# Patient Record
Sex: Male | Born: 1960 | Race: Black or African American | Hispanic: No | Marital: Single
Health system: Southern US, Community
[De-identification: ages and names within clinical notes are randomized; demographics above are authoritative.]

## PROBLEM LIST (undated history)

## (undated) ENCOUNTER — Encounter

## (undated) DIAGNOSIS — Z9889 Other specified postprocedural states: Secondary | ICD-10-CM

## (undated) DIAGNOSIS — F329 Major depressive disorder, single episode, unspecified: Secondary | ICD-10-CM

## (undated) DIAGNOSIS — E119 Type 2 diabetes mellitus without complications: Secondary | ICD-10-CM

## (undated) DIAGNOSIS — M1990 Unspecified osteoarthritis, unspecified site: Secondary | ICD-10-CM

## (undated) DIAGNOSIS — G4733 Obstructive sleep apnea (adult) (pediatric): Secondary | ICD-10-CM

## (undated) DIAGNOSIS — F1010 Alcohol abuse, uncomplicated: Secondary | ICD-10-CM

## (undated) DIAGNOSIS — E785 Hyperlipidemia, unspecified: Secondary | ICD-10-CM

## (undated) DIAGNOSIS — R112 Nausea with vomiting, unspecified: Secondary | ICD-10-CM

## (undated) DIAGNOSIS — N189 Chronic kidney disease, unspecified: Secondary | ICD-10-CM

## (undated) DIAGNOSIS — I10 Essential (primary) hypertension: Secondary | ICD-10-CM

## (undated) DIAGNOSIS — F32A Depression, unspecified: Secondary | ICD-10-CM

## (undated) DIAGNOSIS — A159 Respiratory tuberculosis unspecified: Secondary | ICD-10-CM

## (undated) HISTORY — PX: SPINAL FUSION: SHX223

## (undated) HISTORY — PX: KNEE ARTHROSCOPY: SUR90

## (undated) HISTORY — PX: GASTRIC BYPASS: SHX52

---

## 2012-12-14 DIAGNOSIS — N529 Male erectile dysfunction, unspecified: Secondary | ICD-10-CM | POA: Diagnosis not present

## 2012-12-31 DIAGNOSIS — Z01818 Encounter for other preprocedural examination: Secondary | ICD-10-CM | POA: Diagnosis not present

## 2012-12-31 DIAGNOSIS — N529 Male erectile dysfunction, unspecified: Secondary | ICD-10-CM | POA: Diagnosis not present

## 2013-01-07 DIAGNOSIS — N529 Male erectile dysfunction, unspecified: Secondary | ICD-10-CM | POA: Diagnosis not present

## 2013-01-07 DIAGNOSIS — E119 Type 2 diabetes mellitus without complications: Secondary | ICD-10-CM | POA: Diagnosis not present

## 2013-01-07 DIAGNOSIS — I10 Essential (primary) hypertension: Secondary | ICD-10-CM | POA: Diagnosis not present

## 2013-01-08 DIAGNOSIS — E119 Type 2 diabetes mellitus without complications: Secondary | ICD-10-CM | POA: Diagnosis not present

## 2013-01-08 DIAGNOSIS — I10 Essential (primary) hypertension: Secondary | ICD-10-CM | POA: Diagnosis not present

## 2013-01-08 DIAGNOSIS — N529 Male erectile dysfunction, unspecified: Secondary | ICD-10-CM | POA: Diagnosis not present

## 2013-01-25 DIAGNOSIS — N529 Male erectile dysfunction, unspecified: Secondary | ICD-10-CM | POA: Diagnosis not present

## 2013-02-27 DIAGNOSIS — N189 Chronic kidney disease, unspecified: Secondary | ICD-10-CM

## 2013-02-27 DIAGNOSIS — N182 Chronic kidney disease, stage 2 (mild): Secondary | ICD-10-CM | POA: Diagnosis present

## 2013-02-27 HISTORY — DX: Chronic kidney disease, unspecified: N18.9

## 2013-03-12 ENCOUNTER — Inpatient Hospital Stay (HOSPITAL_COMMUNITY): Payer: Medicare Other

## 2013-03-12 ENCOUNTER — Encounter (HOSPITAL_COMMUNITY): Payer: Self-pay | Admitting: Emergency Medicine

## 2013-03-12 ENCOUNTER — Emergency Department (HOSPITAL_COMMUNITY): Payer: Medicare Other

## 2013-03-12 ENCOUNTER — Inpatient Hospital Stay (HOSPITAL_COMMUNITY)
Admission: EM | Admit: 2013-03-12 | Discharge: 2013-03-17 | DRG: 208 | Disposition: A | Discharge status: Home / Self Care | Payer: Medicare Other | Attending: Internal Medicine | Admitting: Internal Medicine

## 2013-03-12 DIAGNOSIS — E119 Type 2 diabetes mellitus without complications: Secondary | ICD-10-CM | POA: Diagnosis present

## 2013-03-12 DIAGNOSIS — J9600 Acute respiratory failure, unspecified whether with hypoxia or hypercapnia: Secondary | ICD-10-CM | POA: Diagnosis not present

## 2013-03-12 DIAGNOSIS — F1010 Alcohol abuse, uncomplicated: Secondary | ICD-10-CM

## 2013-03-12 DIAGNOSIS — F1020 Alcohol dependence, uncomplicated: Secondary | ICD-10-CM | POA: Diagnosis present

## 2013-03-12 DIAGNOSIS — E872 Acidosis, unspecified: Secondary | ICD-10-CM | POA: Diagnosis present

## 2013-03-12 DIAGNOSIS — Z9884 Bariatric surgery status: Secondary | ICD-10-CM

## 2013-03-12 DIAGNOSIS — D696 Thrombocytopenia, unspecified: Secondary | ICD-10-CM | POA: Diagnosis not present

## 2013-03-12 DIAGNOSIS — J690 Pneumonitis due to inhalation of food and vomit: Secondary | ICD-10-CM | POA: Diagnosis present

## 2013-03-12 DIAGNOSIS — G4730 Sleep apnea, unspecified: Secondary | ICD-10-CM | POA: Diagnosis present

## 2013-03-12 DIAGNOSIS — D638 Anemia in other chronic diseases classified elsewhere: Secondary | ICD-10-CM | POA: Diagnosis present

## 2013-03-12 DIAGNOSIS — E876 Hypokalemia: Secondary | ICD-10-CM | POA: Diagnosis present

## 2013-03-12 DIAGNOSIS — G9340 Encephalopathy, unspecified: Secondary | ICD-10-CM | POA: Diagnosis not present

## 2013-03-12 DIAGNOSIS — F1994 Other psychoactive substance use, unspecified with psychoactive substance-induced mood disorder: Secondary | ICD-10-CM | POA: Diagnosis present

## 2013-03-12 DIAGNOSIS — R739 Hyperglycemia, unspecified: Secondary | ICD-10-CM

## 2013-03-12 DIAGNOSIS — F329 Major depressive disorder, single episode, unspecified: Secondary | ICD-10-CM | POA: Diagnosis present

## 2013-03-12 DIAGNOSIS — F411 Generalized anxiety disorder: Secondary | ICD-10-CM | POA: Diagnosis present

## 2013-03-12 DIAGNOSIS — N179 Acute kidney failure, unspecified: Secondary | ICD-10-CM

## 2013-03-12 DIAGNOSIS — E873 Alkalosis: Secondary | ICD-10-CM | POA: Diagnosis present

## 2013-03-12 DIAGNOSIS — R5381 Other malaise: Secondary | ICD-10-CM | POA: Diagnosis present

## 2013-03-12 DIAGNOSIS — A419 Sepsis, unspecified organism: Secondary | ICD-10-CM

## 2013-03-12 DIAGNOSIS — I10 Essential (primary) hypertension: Secondary | ICD-10-CM | POA: Diagnosis present

## 2013-03-12 HISTORY — DX: Nausea with vomiting, unspecified: R11.2

## 2013-03-12 HISTORY — DX: Essential (primary) hypertension: I10

## 2013-03-12 HISTORY — DX: Respiratory tuberculosis unspecified: A15.9

## 2013-03-12 HISTORY — DX: Type 2 diabetes mellitus without complications: E11.9

## 2013-03-12 HISTORY — DX: Depression, unspecified: F32.A

## 2013-03-12 HISTORY — DX: Unspecified osteoarthritis, unspecified site: M19.90

## 2013-03-12 HISTORY — DX: Other specified postprocedural states: Z98.890

## 2013-03-12 HISTORY — DX: Major depressive disorder, single episode, unspecified: F32.9

## 2013-03-12 HISTORY — DX: Chronic kidney disease, unspecified: N18.9

## 2013-03-12 LAB — COMPREHENSIVE METABOLIC PANEL
ALT: 26 U/L (ref 0–53)
Alkaline Phosphatase: 126 U/L — ABNORMAL HIGH (ref 39–117)
CO2: 20 mEq/L (ref 19–32)
Chloride: 95 mEq/L — ABNORMAL LOW (ref 96–112)
GFR calc Af Amer: 61 mL/min — ABNORMAL LOW (ref >90)
GFR calc non Af Amer: 52 mL/min — ABNORMAL LOW (ref >90)
Glucose, Bld: 182 mg/dL — ABNORMAL HIGH (ref 70–99)
Potassium: 3 mEq/L — ABNORMAL LOW (ref 3.5–5.1)
Sodium: 141 mEq/L (ref 135–145)
Total Bilirubin: 0.5 mg/dL (ref 0.3–1.2)
Total Protein: 7.2 g/dL (ref 6.0–8.3)

## 2013-03-12 LAB — CBC WITH DIFFERENTIAL/PLATELET
Hemoglobin: 15.6 g/dL (ref 13.0–17.0)
Lymphocytes Relative: 12 % (ref 12–46)
Lymphs Abs: 0.9 10*3/uL (ref 0.7–4.0)
Monocytes Relative: 4 % (ref 3–12)
Neutrophils Relative %: 84 % — ABNORMAL HIGH (ref 43–77)
Platelets: 231 10*3/uL (ref 150–400)
RBC: 5.29 MIL/uL (ref 4.22–5.81)
WBC: 7.9 10*3/uL (ref 4.0–10.5)

## 2013-03-12 LAB — POCT I-STAT TROPONIN I: Troponin i, poc: 0.05 ng/mL (ref 0.00–0.08)

## 2013-03-12 LAB — PROTIME-INR
INR: 0.97 (ref 0.00–1.49)
Prothrombin Time: 12.8 seconds (ref 11.6–15.2)

## 2013-03-12 LAB — POCT I-STAT 3, ART BLOOD GAS (G3+)
pCO2 arterial: 43.4 mmHg (ref 35.0–45.0)
pH, Arterial: 7.297 — ABNORMAL LOW (ref 7.350–7.450)
pO2, Arterial: 137 mmHg — ABNORMAL HIGH (ref 80.0–100.0)

## 2013-03-12 LAB — URINALYSIS, ROUTINE W REFLEX MICROSCOPIC
Bilirubin Urine: NEGATIVE
Glucose, UA: NEGATIVE mg/dL
Ketones, ur: NEGATIVE mg/dL
Leukocytes, UA: NEGATIVE
Nitrite: NEGATIVE
Protein, ur: 100 mg/dL — AB
Specific Gravity, Urine: 1.014 (ref 1.005–1.030)
Urobilinogen, UA: 0.2 mg/dL (ref 0.0–1.0)
pH: 5.5 (ref 5.0–8.0)

## 2013-03-12 LAB — GLUCOSE, CAPILLARY
Glucose-Capillary: 198 mg/dL — ABNORMAL HIGH (ref 70–99)
Glucose-Capillary: 161 mg/dL — ABNORMAL HIGH (ref 70–99)

## 2013-03-12 LAB — URINE MICROSCOPIC-ADD ON

## 2013-03-12 LAB — ETHANOL: Alcohol, Ethyl (B): 386 mg/dL — ABNORMAL HIGH (ref 0–11)

## 2013-03-12 LAB — POCT I-STAT, CHEM 8
BUN: 22 mg/dL (ref 6–23)
Potassium: 3.1 mEq/L — ABNORMAL LOW (ref 3.5–5.1)
Sodium: 138 mEq/L (ref 135–145)
TCO2: 22 mmol/L (ref 0–100)

## 2013-03-12 LAB — CK: Total CK: 247 U/L — ABNORMAL HIGH (ref 7–232)

## 2013-03-12 LAB — CK TOTAL AND CKMB (NOT AT ARMC)
CK, MB: 5.8 ng/mL — ABNORMAL HIGH (ref 0.3–4.0)
Relative Index: 2.8 — ABNORMAL HIGH (ref 0.0–2.5)

## 2013-03-12 LAB — PHOSPHORUS: Phosphorus: 4.5 mg/dL (ref 2.3–4.6)

## 2013-03-12 LAB — CG4 I-STAT (LACTIC ACID): Lactic Acid, Venous: 6.23 mmol/L — ABNORMAL HIGH (ref 0.5–2.2)

## 2013-03-12 LAB — PROCALCITONIN: Procalcitonin: 0.11 ng/mL

## 2013-03-12 MED ORDER — ETOMIDATE 2 MG/ML IV SOLN
20.0000 mg | Freq: Once | INTRAVENOUS | Status: AC
  Administered 2013-03-12: 20 mg via INTRAVENOUS

## 2013-03-12 MED ORDER — HALOPERIDOL LACTATE 5 MG/ML IJ SOLN
10.0000 mg | Freq: Once | INTRAMUSCULAR | Status: AC
  Administered 2013-03-12: 10 mg via INTRAVENOUS
  Filled 2013-03-12: qty 2

## 2013-03-12 MED ORDER — SODIUM CHLORIDE 0.9 % IV BOLUS (SEPSIS)
1000.0000 mL | Freq: Once | INTRAVENOUS | Status: AC
  Administered 2013-03-12: 1000 mL via INTRAVENOUS

## 2013-03-12 MED ORDER — SODIUM CHLORIDE 0.9 % IV SOLN
3.0000 g | Freq: Three times a day (TID) | INTRAVENOUS | Status: DC
  Administered 2013-03-12 – 2013-03-15 (×8): 3 g via INTRAVENOUS
  Filled 2013-03-12 (×10): qty 3

## 2013-03-12 MED ORDER — CHLORHEXIDINE GLUCONATE 0.12 % MT SOLN
15.0000 mL | Freq: Two times a day (BID) | OROMUCOSAL | Status: DC
  Administered 2013-03-12 – 2013-03-14 (×4): 15 mL via OROMUCOSAL
  Filled 2013-03-12 (×3): qty 15

## 2013-03-12 MED ORDER — MIDAZOLAM HCL 10 MG/2ML IJ SOLN: 4.0000 mg | Freq: Once | INTRAMUSCULAR | Status: AC

## 2013-03-12 MED ORDER — MIDAZOLAM HCL 2 MG/2ML IJ SOLN
INTRAMUSCULAR | Status: AC
  Administered 2013-03-12: 4 mg
  Filled 2013-03-12: qty 4

## 2013-03-12 MED ORDER — FENTANYL CITRATE 0.05 MG/ML IJ SOLN
100.0000 ug | Freq: Once | INTRAMUSCULAR | Status: AC
  Administered 2013-03-12: 100 ug via INTRAVENOUS

## 2013-03-12 MED ORDER — SUCCINYLCHOLINE CHLORIDE 20 MG/ML IJ SOLN
INTRAMUSCULAR | Status: AC
  Filled 2013-03-12: qty 1

## 2013-03-12 MED ORDER — ONDANSETRON HCL 4 MG/2ML IJ SOLN
4.0000 mg | Freq: Once | INTRAMUSCULAR | Status: AC
  Administered 2013-03-12: 4 mg via INTRAVENOUS
  Filled 2013-03-12: qty 2

## 2013-03-12 MED ORDER — SODIUM CHLORIDE 0.9 % IV BOLUS (SEPSIS): 1000.0000 mL | Freq: Once | INTRAVENOUS | Status: DC

## 2013-03-12 MED ORDER — SODIUM CHLORIDE 0.9 % IV SOLN
25.0000 ug/h | INTRAVENOUS | Status: DC
  Filled 2013-03-12: qty 50

## 2013-03-12 MED ORDER — INSULIN ASPART 100 UNIT/ML ~~LOC~~ SOLN
2.0000 [IU] | SUBCUTANEOUS | Status: DC
  Administered 2013-03-12 (×2): 4 [IU] via SUBCUTANEOUS
  Administered 2013-03-13: 2 [IU] via SUBCUTANEOUS
  Administered 2013-03-13: 4 [IU] via SUBCUTANEOUS
  Administered 2013-03-13: 3 [IU] via SUBCUTANEOUS
  Administered 2013-03-13: 6 [IU] via SUBCUTANEOUS
  Administered 2013-03-13: 2 [IU] via SUBCUTANEOUS
  Administered 2013-03-14: 4 [IU] via SUBCUTANEOUS
  Administered 2013-03-14: 2 [IU] via SUBCUTANEOUS

## 2013-03-12 MED ORDER — ROCURONIUM BROMIDE 50 MG/5ML IV SOLN
INTRAVENOUS | Status: AC
  Filled 2013-03-12: qty 2

## 2013-03-12 MED ORDER — ETOMIDATE 2 MG/ML IV SOLN
INTRAVENOUS | Status: AC
  Filled 2013-03-12: qty 20

## 2013-03-12 MED ORDER — LORAZEPAM 2 MG/ML IJ SOLN
2.0000 mg | Freq: Four times a day (QID) | INTRAMUSCULAR | Status: DC
  Administered 2013-03-12: 2 mg via INTRAVENOUS
  Filled 2013-03-12: qty 1

## 2013-03-12 MED ORDER — PROPOFOL 10 MG/ML IV EMUL
5.0000 ug/kg/min | INTRAVENOUS | Status: DC
  Administered 2013-03-12: 12:00:00 via INTRAVENOUS

## 2013-03-12 MED ORDER — THIAMINE HCL 100 MG/ML IJ SOLN
100.0000 mg | Freq: Every day | INTRAMUSCULAR | Status: DC
  Administered 2013-03-12 – 2013-03-14 (×3): 100 mg via INTRAVENOUS
  Filled 2013-03-12 (×2): qty 1
  Filled 2013-03-12: qty 2

## 2013-03-12 MED ORDER — PIPERACILLIN-TAZOBACTAM 3.375 G IVPB
3.3750 g | Freq: Once | INTRAVENOUS | Status: AC
  Administered 2013-03-12: 3.375 g via INTRAVENOUS
  Filled 2013-03-12: qty 50

## 2013-03-12 MED ORDER — DEXMEDETOMIDINE HCL IN NACL 200 MCG/50ML IV SOLN
0.2000 ug/kg/h | INTRAVENOUS | Status: DC
  Filled 2013-03-12: qty 50

## 2013-03-12 MED ORDER — FOLIC ACID 5 MG/ML IJ SOLN
1.0000 mg | Freq: Every day | INTRAMUSCULAR | Status: DC
  Administered 2013-03-12 – 2013-03-14 (×3): 1 mg via INTRAVENOUS
  Filled 2013-03-12 (×5): qty 0.2

## 2013-03-12 MED ORDER — HALOPERIDOL LACTATE 5 MG/ML IJ SOLN
5.0000 mg | Freq: Once | INTRAMUSCULAR | Status: AC
  Administered 2013-03-12: 5 mg via INTRAVENOUS

## 2013-03-12 MED ORDER — SODIUM CHLORIDE 0.9 % IV SOLN
INTRAVENOUS | Status: DC
  Administered 2013-03-12: 13:00:00 via INTRAVENOUS

## 2013-03-12 MED ORDER — NALOXONE HCL 0.4 MG/ML IJ SOLN
0.4000 mg | Freq: Once | INTRAMUSCULAR | Status: AC
  Administered 2013-03-12: 0.4 mg via INTRAVENOUS
  Filled 2013-03-12: qty 1

## 2013-03-12 MED ORDER — ROCURONIUM BROMIDE 50 MG/5ML IV SOLN
100.0000 mg | Freq: Once | INTRAVENOUS | Status: AC
  Administered 2013-03-12: 100 mg via INTRAVENOUS

## 2013-03-12 MED ORDER — SODIUM CHLORIDE 0.9 % IV SOLN: 250.0000 mL | INTRAVENOUS | Status: DC | PRN

## 2013-03-12 MED ORDER — DEXMEDETOMIDINE HCL IN NACL 200 MCG/50ML IV SOLN: 0.4000 ug/kg/h | INTRAVENOUS | Status: DC

## 2013-03-12 MED ORDER — FENTANYL CITRATE 0.05 MG/ML IJ SOLN
50.0000 ug | INTRAMUSCULAR | Status: DC | PRN
  Filled 2013-03-12: qty 2

## 2013-03-12 MED ORDER — BIOTENE DRY MOUTH MT LIQD
15.0000 mL | Freq: Four times a day (QID) | OROMUCOSAL | Status: DC
  Administered 2013-03-13 – 2013-03-14 (×7): 15 mL via OROMUCOSAL

## 2013-03-12 MED ORDER — PROPOFOL 10 MG/ML IV EMUL
INTRAVENOUS | Status: AC
  Filled 2013-03-12: qty 100

## 2013-03-12 MED ORDER — VANCOMYCIN HCL IN DEXTROSE 1-5 GM/200ML-% IV SOLN
1000.0000 mg | Freq: Once | INTRAVENOUS | Status: DC
  Filled 2013-03-12: qty 200

## 2013-03-12 MED ORDER — DEXMEDETOMIDINE HCL IN NACL 400 MCG/100ML IV SOLN
0.4000 ug/kg/h | INTRAVENOUS | Status: DC
  Administered 2013-03-12: 0.7 ug/kg/h via INTRAVENOUS
  Administered 2013-03-12: 1.2 ug/kg/h via INTRAVENOUS
  Administered 2013-03-13: 0.8 ug/kg/h via INTRAVENOUS
  Administered 2013-03-13: 1 ug/kg/h via INTRAVENOUS
  Administered 2013-03-13: 1.2 ug/kg/h via INTRAVENOUS
  Administered 2013-03-13: 0.9 ug/kg/h via INTRAVENOUS
  Administered 2013-03-13: 1 ug/kg/h via INTRAVENOUS
  Administered 2013-03-13: 1.4 ug/kg/h via INTRAVENOUS
  Filled 2013-03-12 (×8): qty 100

## 2013-03-12 MED ORDER — POTASSIUM CHLORIDE 10 MEQ/100ML IV SOLN: 10.0000 meq | INTRAVENOUS | Status: DC

## 2013-03-12 MED ORDER — PANTOPRAZOLE SODIUM 40 MG IV SOLR
40.0000 mg | Freq: Every day | INTRAVENOUS | Status: DC
  Administered 2013-03-12 – 2013-03-13 (×2): 40 mg via INTRAVENOUS
  Filled 2013-03-12 (×4): qty 40

## 2013-03-12 MED ORDER — DEXTROSE 5 % IV SOLN
30.0000 ug/min | INTRAVENOUS | Status: DC
  Filled 2013-03-12: qty 1

## 2013-03-12 MED ORDER — HALOPERIDOL LACTATE 5 MG/ML IJ SOLN
INTRAMUSCULAR | Status: AC
  Filled 2013-03-12: qty 1

## 2013-03-12 MED ORDER — LIDOCAINE HCL (CARDIAC) 20 MG/ML IV SOLN
INTRAVENOUS | Status: AC
  Filled 2013-03-12: qty 5

## 2013-03-12 MED ORDER — DEXMEDETOMIDINE HCL IN NACL 400 MCG/100ML IV SOLN
0.4000 ug/kg/h | INTRAVENOUS | Status: DC
  Administered 2013-03-12: 1.4 ug/kg/h via INTRAVENOUS

## 2013-03-12 NOTE — ED Notes (Signed)
8.0 ET tube, at 23 lip.

## 2013-03-12 NOTE — H&P (Signed)
PULMONARY  / CRITICAL CARE MEDICINE  Name: Angel Phillips MRN: [id number] DOB: 11/15/1960    ADMISSION DATE:  03/12/2013 CONSULTATION DATE:  03/12/13  REFERRING MD :  EDP PRIMARY SERVICE: CCM  CHIEF COMPLAINT:  AMS  BRIEF PATIENT DESCRIPTION: 52 year old male, admitted with AMS intubated in ED. History of alcohol binges unknown if chronic use.   SIGNIFICANT EVENTS / STUDIES:  6/14 Admit 6/14 Head CT >>> 6/14 EEG >>>  LINES / TUBES: 8.0 ETT 6/14 >>>  CULTURES: Blood x2 6/14 >>> Urine 6/14 >>>   ANTIBIOTICS: Unasyn 6/14 >>>   HISTORY OF PRESENT ILLNESS:  Angel Phillips is a 52 year old male with a history of alcohol binges but an unclear history of chronic alcohol use. His family noted that it had been at least 4 days since they had been able to contact him and called [facility] PD to check on him on 6/14. Police broke into his home and found him minimally responsive in a chair, covered in urine, feces, and vomit. He was noted to have multiple empty alcohol containers surrounding him.  Upon arrival to the ED he was intermittently combative and minimally responsive. He was intubated for airway protection and for further workup and evaluation. They noted an increased lactate and he also has an AKI.  We are consulted for admission and management.   PAST MEDICAL HISTORY :  Past Medical History  Diagnosis Date  . Hypertension   . Diabetes mellitus without complication    History reviewed. No pertinent past surgical history. Prior to Admission medications   Not on File   No Known Allergies  FAMILY HISTORY:  History reviewed. No pertinent family history. SOCIAL HISTORY:  reports that  drinks alcohol. His tobacco and drug histories are not on file.  REVIEW OF SYSTEMS:  Unable to obtain following intubation  SUBJECTIVE:   Intubated  VITAL SIGNS: Temp:  [97.5 F (36.4 C)] 97.5 F (36.4 C) (06/14 0948) Pulse Rate:  [101-139] 139 (06/14 1115) Resp:  [16-26] 18 (06/14  1115) BP: (158-178)/(78-106) 178/106 mmHg (06/14 1102) SpO2:  [93 %-100 %] 100 % (06/14 1115) FiO2 (%):  [100 %] 100 % (06/14 1100) HEMODYNAMICS:   VENTILATOR SETTINGS: Vent Mode:  [-] PRVC FiO2 (%):  [100 %] 100 % Set Rate:  [16 bmp] 16 bmp Vt Set:  [600 mL] 600 mL PEEP:  [5 cmH20] 5 cmH20 Plateau Pressure:  [16 cmH20] 16 cmH20 INTAKE / OUTPUT: Intake/Output   None     PHYSICAL EXAMINATION: General:  Obese male, intubated Neuro:  Sedated. PERRL, does not follow commands HEENT:  intubated Cardiovascular:  RRR, tachycardic. S1 and S2. No JVD. +2 radial and DP pulses Lungs:  Scattered rhonchi anterior lung fields Abdomen:  Round, soft, non-tender. Active BS Musculoskeletal:  Passive motion of extremities Skin:  Angel, warm, intact  LABS:  Recent Labs Lab 03/12/13 1003 03/12/13 1020 03/12/13 1021 03/12/13 1214  HGB 15.6 16.3  --   --   WBC 7.9  --   --   --   PLT 231  --   --   --   NA 141 138  --   --   K 3.0* 3.1*  --   --   CL 95* 99  --   --   CO2 20  --   --   --   GLUCOSE 182* 189*  --   --   BUN 23 22  --   --   CREATININE 1.48* 2.00*  --   --  CALCIUM 8.1*  --   --   --   AST 59*  --   --   --   ALT 26  --   --   --   ALKPHOS 126*  --   --   --   BILITOT 0.5  --   --   --   PROT 7.2  --   --   --   ALBUMIN 3.1*  --   --   --   INR 0.98  --   --   --   LATICACIDVEN  --   --  6.23*  --   PHART  --   --   --  7.297*  PCO2ART  --   --   --  43.4  PO2ART  --   --   --  137.0*    Recent Labs Lab 03/12/13 1154  GLUCAP 198*    CXR:  ETT in satisfactory position. No acute cardiopulmonary process  ASSESSMENT / PLAN:  PULMONARY  Recent Labs Lab 03/12/13 1020 03/12/13 1214  PHART  --  7.297*  PCO2ART  --  43.4  PO2ART  --  137.0*  HCO3  --  21.4  TCO2 22 23  O2SAT  --  99.0    A: Acute respiratory failure most likely alcohol intoxication, could be post-ictal from seizure P:   - Full vent support  CARDIOVASCULAR  No results found for  this basename: TROPONINI,  in the last 168 hours No results found for this basename: PROBNP,  in the last 168 hours  A: Hypertension could be alcohol withdrawal driving his hypertension     Tachycardia can also be related to alcohol withdrawal, or could be hypovolemia P:  - IVF bolus for hypovolemia - MIVF while NPO - Ativan for withdrawal - Cycle cardiac enzymes with hypertension and tachycardia  RENAL  Recent Labs Lab 03/12/13 1003 03/12/13 1020 03/12/13 1110  NA 141 138  --   K 3.0* 3.1*  --   CL 95* 99  --   CO2 20  --   --   GLUCOSE 182* 189*  --   BUN 23 22  --   CREATININE 1.48* 2.00*  --   CALCIUM 8.1*  --   --   MG  --   --  2.3  PHOS  --   --  4.5     Intake/Output     06/13 0701 - 06/14 0700 06/14 0701 - 06/15 0700   I.V.  1200   Total Intake   1200   Urine  350   Total Output   350   Net   +850          A:  AKI could be prerenal etiology vs rhabdomyolysis with his prolonged down time      Metabolic Acidosis (anion gap is 26.0) elevated lactate on arrival and recent alcohol consumption are the most likely cause      Also has concomitant Metabolic Alkalosis (corrected bicarb was 34), likely due to bicarb loss fm diarrhea and possibly renal injury       Hypokalemia P:   - IVF as above - Send urine electrolytes - Would not give sodium bicarbonate at this time - Strict I&O - Urine myoglobin - Trend serum CK - Replete electrolytes as indicated - Follow bmet  GASTROINTESTINAL  Recent Labs Lab 03/12/13 1003 03/12/13 1203  AST 59*  --   ALT 26  --   ALKPHOS 126*  --  BILITOT 0.5  --   PROT 7.2  --   ALBUMIN 3.1*  --   INR 0.98 0.97    A:  Acute Transaminitis likely from ethanol consumption      Elevated Lipase likely from ethanol consumption P:   - NPO - IV thiamine and folate given alcohol history - Protonix for SUP  HEMATOLOGIC  Recent Labs Lab 03/12/13 1003 03/12/13 1020  HGB 15.6 16.3  HCT 44.1 48.0  WBC 7.9  --   PLT 231   --     Recent Labs Lab 03/12/13 1003 03/12/13 1203  INR 0.98 0.97    A:  No acute Issues P:  - Follow CBC, currently values likely represent hemoconcentration  INFECTIOUS  Recent Labs Lab 03/12/13 1021 03/12/13 1205 03/12/13 1206  LATICACIDVEN 6.23* 5.4*  --   PROCALCITON  --   --  0.11    Recent Labs Lab 03/12/13 1003  WBC 7.9    A:  ? aspiration P:   - Cover with Unasyn for possible aspiration - F/U cultures - Follow WBC  ENDOCRINE CBG (last 3)   Recent Labs  03/12/13 1154  GLUCAP 198*     A:  Hyperglycemia likely from acute illness, no history of DM   P:   - Check A1c - Follow CBG - Hyperglycemia protocol  NEUROLOGIC  A:  Acute encephalopathy likely from ethanol intoxication, concern for wernicke's  P:   - Propofol for sedation, would change to precedex due to elevated lipase - PRN fentanyl - Scheduled ativan - Thiamine and folic acid as above - Head CT to rule out acute intracranial process - EEG to evaluate for seizure activity  TODAY'S SUMMARY: Mr. Gilardi presents after being found altered and minimally responsive at home. He has been intubated for airway protection and to allow further workup. Ethanol intoxication seems the most likely given his blood ethanol level of 386 mg/dL, however he could be having seizure activity or alcohol withdrawal. Will need central line to help guide fluid resuscitation. Will continue workup as above.  Sharlet Salina Hocutt S-ACNP and Anders Simmonds NP     STAFF NOTE: I, Dr Lavinia Sharps have personally reviewed patient's available data, including medical history, events of note, physical examination and test results as part of my evaluation. I have discussed with resident/NP and other care providers such as pharmacist, RN and RRT.  In addition,  I personally evaluated patient and elicited key findings of being found down, lactic acidosis, and ethanol intoxication. Intubated in ER. Wil Rx supportive with fluids, vent  and abx. Will place cVL. Attempt to get hold of family.  Rest per NP/medical resident whose note is outlined above and that I agree with  The patient is critically ill with multiple organ systems failure and requires high complexity decision making for assessment and support, frequent evaluation and titration of therapies, application of advanced monitoring technologies and extensive interpretation of multiple databases.   Critical Care Time devoted to patient care services described in this note is  60  Minutes.  Dr. Kalman Shan, M.D., [facility].C.P Pulmonary and Critical Care Medicine Staff Physician [facility] Pager: [phone], If no answer or between  15:00h - 7:00h: call [phone]  03/12/2013 2:35 PM

## 2013-03-12 NOTE — ED Notes (Signed)
Pt arrives to ed via [facility] for altered loc. ems sts forced entry into pt apt after receiving phone call from son located in [employer].  CAox1. Nad. ems sts numerous ETOH btls at scene.  Pmsx4. Pt unable to answer assessment questions appropriately.

## 2013-03-12 NOTE — ED Notes (Signed)
CBG 198 

## 2013-03-12 NOTE — ED Notes (Signed)
Ct tech reports unable to perform exam-pt uncooperative.

## 2013-03-12 NOTE — Progress Notes (Signed)
ANTIBIOTIC CONSULT NOTE - INITIAL  Pharmacy Consult for Unasyn Indication: r/o aspiration PNA, r/o sepsis  No Known Allergies  Patient Measurements:   Estimated Ht: 6 ft Estimated Wt: 100 kg   Vital Signs: Temp: 97.5 F (36.4 C) (06/14 0948) Temp src: Rectal (06/14 0948) BP: 178/106 mmHg (06/14 1102) Pulse Rate: 139 (06/14 1115) Intake/Output from previous day:   Intake/Output from this shift:    Labs:  Recent Labs  03/12/13 1003 03/12/13 1020  WBC 7.9  --   HGB 15.6 16.3  PLT 231  --   CREATININE 1.48* 2.00*   CrCl is unknown because there is no height on file for the current visit. No results found for this basename: VANCOTROUGH, VANCOPEAK, VANCORANDOM, GENTTROUGH, GENTPEAK, GENTRANDOM, TOBRATROUGH, TOBRAPEAK, TOBRARND, AMIKACINPEAK, AMIKACINTROU, AMIKACIN,  in the last 72 hours   Microbiology: No results found for this or any previous visit (from the past 720 hour(s)).  Medical History: Past Medical History  Diagnosis Date  . Hypertension   . Diabetes mellitus without complication     Assessment: 52 y.o. M admitted to the [facility] after being found unresponsive sitting in feces/urine, and vomit. Pharmacy was consulted to start Unasyn for r/o aspiration PNA, r/o sepsis. Estimated Ht per ED nurse: 77ft, estimated wt: 100 kg. SCr 2, CrCl~40 ml/min (normalized)  Goal of Therapy:  Proper antibiotics for infection/cultures adjusted for renal/hepatic function   Plan:  1. Unasyn 3g IV every 8 hours 2. Will continue to follow renal function, culture results, LOT, and antibiotic de-escalation plans   Georgina Pillion, PharmD, BCPS Clinical Pharmacist Pager: [phone] 03/12/2013 12:44 PM

## 2013-03-12 NOTE — ED Notes (Signed)
Peter-NP ordered me to not admin vancomycin.

## 2013-03-12 NOTE — Procedures (Signed)
Central Venous Catheter Insertion Procedure Note Angel Phillips [id number] 1961/04/04  Procedure: Insertion of Central Venous Catheter Indications: Assessment of intravascular volume, Drug and/or fluid administration and Frequent blood sampling  Procedure Details Consent: Risks of procedure as well as the alternatives and risks of each were explained to the (patient/caregiver).  Consent for procedure obtained. and Unable to obtain consent because of altered level of consciousness. Time Out: Verified patient identification, verified procedure, site/side was marked, verified correct patient position, special equipment/implants available, medications/allergies/relevent history reviewed, required imaging and test results available.  Performed REAL TIME [place] USED TO ID AND CANNULATE VESSEL  Maximum sterile technique was used including antiseptics, cap, gloves, gown, hand hygiene, mask and sheet. Skin prep: Chlorhexidine; local anesthetic administered A antimicrobial bonded/coated triple lumen catheter was placed in the right internal jugular vein using the Seldinger technique.  Evaluation Blood flow good Complications: No apparent complications Patient did tolerate procedure well. Chest X-ray ordered to verify placement.  CXR: pending.  BABCOCK,PETE 03/12/2013, 2:57 PM  STAFF NOTE Personally staffed procedure.. Real time 2D ultrasound used for vein site selection, patency assessment, and needle entry. A record of image was made but could not be submitted for filing due to malfunction of printing device   Dr. Kalman Shan, M.D., [facility].C.P Pulmonary and Critical Care Medicine Staff Physician [facility] [place] [facility] and Critical Care Pager: [phone], If no answer or between  15:00h - 7:00h: call [phone]  03/12/2013 3:49 PM

## 2013-03-12 NOTE — ED Provider Notes (Signed)
History     CSN: [id number]  Arrival date & time 03/12/13  8469   First MD Initiated Contact with Patient 03/12/13 [phone]     Chief Complaint  Patient presents with  . Altered Mental Status   (Consider location/radiation/quality/duration/timing/severity/associated sxs/prior treatment) HPI Comments: Angel Phillips is a 52 year old obese African American male brought in to [facility] by EMS for altered mental status.  Per EMS personnel, they were called by son in [place] who had not heard from his father for the past 2 days.  When EMS arrived, they had to break into the home and found patient sitting in a chair covered in feces, urine, and vomit.  They report seeing bottles of alcohol and a glucometer.  Angel Phillips was awake but not responsive.  Noted to be tachycardic and hypertensive as per EMS.  Unable to obtain history from patient, respond to painful stimulus.  Initially, not responsive to questions and then became agitated and asking to leave.  Angel Phillips was in and out of consciousness with increasing agitation and noted to desat to 70% on room air with movement.  Placed on 4L [place] O2 with improved saturation.  Continued agitation and confusion, subsequently intubated for airway protection.    No number on chart to speak to any family member.   Patient is a 52 y.o. male presenting with altered mental status. The history is provided by the EMS personnel. No language interpreter was used.  Altered Mental Status Presenting symptoms: lethargy and partial responsiveness   Severity:  Severe Episode history:  Unable to specify Associated symptoms: agitation, bladder incontinence and vomiting     Past Medical History  Diagnosis Date  . Hypertension   . Diabetes mellitus without complication     History reviewed. No pertinent past surgical history.  History reviewed. No pertinent family history.  History  Substance Use Topics  . Smoking status: Not on file  . Smokeless tobacco: Not on file  .  Alcohol Use: Yes    Review of Systems  Unable to perform ROS Gastrointestinal: Positive for vomiting.  Genitourinary: Positive for bladder incontinence.  Psychiatric/Behavioral: Positive for agitation and altered mental status.    Allergies  Review of patient's allergies indicates no known allergies.  Home Medications  No current outpatient prescriptions on file.  BP 178/106  Pulse 139  Temp(Src) 97.5 F (36.4 C) (Rectal)  Resp 18  SpO2 100%  Physical Exam  Constitutional:  Obese and agitated, malodorous  HENT:  Head: Normocephalic and atraumatic.  Eyes:  Sluggish pupil response to light  Cardiovascular:  Tachycardia, +radial and femoral pulses intact, DP not appreciated  Pulmonary/Chest: Effort normal and breath sounds normal.  Abdominal: Soft. Bowel sounds are normal. He exhibits distension. There is no tenderness.  obese  Musculoskeletal: He exhibits edema.  Neurological:  Awake, not oriented to person, place, or time, withdraws to pain  Skin: Skin is warm and dry.  Psychiatric:  Agitated   ED Course  Procedures (including critical care time)  Labs Reviewed  CBC WITH DIFFERENTIAL - Abnormal; Notable for the following:    Neutrophils Relative % 84 (*)    All other components within normal limits  COMPREHENSIVE METABOLIC PANEL - Abnormal; Notable for the following:    Potassium 3.0 (*)    Chloride 95 (*)    Glucose, Bld 182 (*)    Creatinine, Ser 1.48 (*)    Calcium 8.1 (*)    Albumin 3.1 (*)    AST 59 (*)  Alkaline Phosphatase 126 (*)    GFR calc non Af Amer 52 (*)    GFR calc Af Amer 61 (*)    All other components within normal limits  LIPASE, BLOOD - Abnormal; Notable for the following:    Lipase 200 (*)    All other components within normal limits  CK - Abnormal; Notable for the following:    Total CK 247 (*)    All other components within normal limits  SALICYLATE LEVEL - Abnormal; Notable for the following:    Salicylate Lvl <2.0 (*)     All other components within normal limits  ETHANOL - Abnormal; Notable for the following:    Alcohol, Ethyl (B) 386 (*)    All other components within normal limits  GLUCOSE, CAPILLARY - Abnormal; Notable for the following:    Glucose-Capillary 198 (*)    All other components within normal limits  POCT I-STAT, CHEM 8 - Abnormal; Notable for the following:    Potassium 3.1 (*)    Creatinine, Ser 2.00 (*)    Glucose, Bld 189 (*)    Calcium, Ion 0.93 (*)    All other components within normal limits  CG4 I-STAT (LACTIC ACID) - Abnormal; Notable for the following:    Lactic Acid, Venous 6.23 (*)    All other components within normal limits  POCT I-STAT 3, BLOOD GAS (G3+) - Abnormal; Notable for the following:    pH, Arterial 7.297 (*)    pO2, Arterial 137.0 (*)    Acid-base deficit 5.0 (*)    All other components within normal limits  CULTURE, BLOOD (ROUTINE X 2)  CULTURE, BLOOD (ROUTINE X 2)  URINE CULTURE  CULTURE, RESPIRATORY (NON-EXPECTORATED)  PROTIME-INR  ACETAMINOPHEN LEVEL  URINALYSIS, ROUTINE W REFLEX MICROSCOPIC  AMMONIA  MAGNESIUM  TROPONIN I  TROPONIN I  TROPONIN I  LACTIC ACID, PLASMA  PROCALCITONIN  PROTIME-INR  BLOOD GAS, ARTERIAL  MYOGLOBIN, URINE  CK TOTAL AND CKMB  HEMOGLOBIN A1C  PHOSPHORUS  URINE RAPID DRUG SCREEN (HOSP PERFORMED)  POCT I-STAT TROPONIN I   Dg Chest Portable 1 View  03/12/2013   *RADIOLOGY REPORT*  Clinical Data: Altered mental status  PORTABLE CHEST - 1 VIEW  Comparison: None.  Findings: Cardiomediastinal silhouette is unremarkable. Endotracheal tube in place with tip 3.3 cm above the carina.  Poor inspiration.  No acute infiltrate or pulmonary edema.  IMPRESSION: Endotracheal tube in place.  Poor inspiration.  No acute infiltrate or pulmonary edema.   Original Report Authenticated By: Natasha Mead, M.D.    1. Sepsis     Date: 03/12/2013  Rate: 115bpm  Rhythm: sinus tachycardia  QRS Axis: left borderline  Intervals: normal  ST/T Wave  abnormalities: nonspecific T wave changes t wave flattening/inv leads I and aVL  Conduction Disutrbances:none  Narrative Interpretation: 115bpm, sinus tachycardia, non-specific t wave changes  Old EKG Reviewed: none available  MDM  Angel Phillips is a 52 year old male brought in by EMS for AMS.  Found at home sitting in chair, responsive to painful stimulus with feces, urine, and vomit.  Unknown etiology at this time. Increasing agitation and confusion, noted to desat to 70% on room air with improvement on 4L NC02, tachycardia and hypertensin.  Subsequently intubated, remained tachycardic.  Lactic acid 6.23, Troponin i poc 0.05x1, hypokalemia K 3.1 on chem 8 iSTAT.  CXR with ET tube in place, no acute infiltrate or edema. Discussed with PCCM, will admit for encephalopathy ?seizure, and ?sepsis with possible aspiration.  -CT  head and C spine -blood cx x2 -intubated on mechanical ventilation; propofol -NS   -Zosyn -Narcan x1 -Zofran x1 -CBC, PT/INR -CMET -Mag -U/A -UDS -ammonia, lipase, CK, acetaminophen, alcohol, and salicylate level  Case discussed and patient seen with Dr. Manus Gunning, agree with assessment and plan.          Darden Palmer, MD 03/12/13 1228

## 2013-03-12 NOTE — ED Notes (Signed)
bilat breathsounds confirmed.  CTA.

## 2013-03-12 NOTE — ED Notes (Signed)
Girlfriend Angel Phillips is RN- [phone].  Kire name of pts son in [place] [phone]

## 2013-03-12 NOTE — ED Provider Notes (Signed)
I saw and evaluated the patient, reviewed the resident's note and I agree with the findings and plan. If applicable, I agree with the resident's interpretation of the EKG.  If applicable, I was present for critical portions of any procedures performed.  Found slumped in chair, covered in feces, urine vomit. Alcohol containers surrounding him. Police broke in after family had not heard from him in several days. Obtunded on arrival, smells of alcohol, intermittently agitated and noncooperative. No evidence of trauma. RSI performed for airway protection and to allow further workup. Encephalopathy likely 2.2 alcohol intoxication. Questionable seizure? CT head negative. Lactic acidosis with high anion gap. AKI. Aggressive IVF fluids, cultures, empiric antibiotics for presumed aspiration.  INTUBATION Performed by: Glynn Octave  Required items: required blood products, implants, devices, and special equipment available Patient identity confirmed: provided demographic data and hospital-assigned identification number Time out: Immediately prior to procedure a "time out" was called to verify the correct patient, procedure, equipment, support staff and site/side marked as required.  Indications: airway protection  Intubation method: Glidescope Laryngoscopy   Preoxygenation: BVM  Sedatives: 20 mgEtomidate Paralytic: 100 mg Rocuronium Tube Size: 8.0 cuffed  Post-procedure assessment: chest rise and ETCO2 monitor Breath sounds: equal and absent over the epigastrium Tube secured with: ETT holder Chest x-ray interpreted by radiologist and me.  Chest x-ray findings: endotracheal tube in appropriate position  Patient tolerated the procedure well with no immediate complications.  CRITICAL CARE Performed by: Glynn Octave Total critical care time: 45 Critical care time was exclusive of separately billable procedures and treating other patients. Critical care was necessary to treat or prevent  imminent or life-threatening deterioration. Critical care was time spent personally by me on the following activities: development of treatment plan with patient and/or surrogate as well as nursing, discussions with consultants, evaluation of patient's response to treatment, examination of patient, obtaining history from patient or surrogate, ordering and performing treatments and interventions, ordering and review of laboratory studies, ordering and review of radiographic studies, pulse oximetry and re-evaluation of patient's condition.    Glynn Octave, MD 03/12/13 [phone]

## 2013-03-13 DIAGNOSIS — J9600 Acute respiratory failure, unspecified whether with hypoxia or hypercapnia: Secondary | ICD-10-CM | POA: Diagnosis not present

## 2013-03-13 DIAGNOSIS — G9340 Encephalopathy, unspecified: Secondary | ICD-10-CM | POA: Diagnosis not present

## 2013-03-13 DIAGNOSIS — E872 Acidosis, unspecified: Secondary | ICD-10-CM

## 2013-03-13 LAB — CBC
HCT: 36 % — ABNORMAL LOW (ref 39.0–52.0)
Hemoglobin: 12.4 g/dL — ABNORMAL LOW (ref 13.0–17.0)
RBC: 4.24 MIL/uL (ref 4.22–5.81)
WBC: 6.4 10*3/uL (ref 4.0–10.5)

## 2013-03-13 LAB — BLOOD GAS, ARTERIAL
MECHVT: 550 mL
O2 Saturation: 96.3 %
PEEP: 5 cmH2O
Patient temperature: 98.6
pH, Arterial: 7.418 (ref 7.350–7.450)

## 2013-03-13 LAB — BASIC METABOLIC PANEL
BUN: 22 mg/dL (ref 6–23)
CO2: 25 mEq/L (ref 19–32)
Chloride: 103 mEq/L (ref 96–112)
Glucose, Bld: 169 mg/dL — ABNORMAL HIGH (ref 70–99)
Potassium: 3.3 mEq/L — ABNORMAL LOW (ref 3.5–5.1)

## 2013-03-13 LAB — GLUCOSE, CAPILLARY
Glucose-Capillary: 143 mg/dL — ABNORMAL HIGH (ref 70–99)
Glucose-Capillary: 177 mg/dL — ABNORMAL HIGH (ref 70–99)

## 2013-03-13 MED ORDER — HALOPERIDOL LACTATE 5 MG/ML IJ SOLN
INTRAMUSCULAR | Status: AC
  Filled 2013-03-13: qty 2

## 2013-03-13 MED ORDER — HALOPERIDOL LACTATE 5 MG/ML IJ SOLN
10.0000 mg | Freq: Once | INTRAMUSCULAR | Status: AC
  Administered 2013-03-13: 10 mg via INTRAVENOUS
  Filled 2013-03-13: qty 1

## 2013-03-13 MED ORDER — MAGNESIUM SULFATE 50 % IJ SOLN
2.0000 g | Freq: Once | INTRAVENOUS | Status: AC
  Administered 2013-03-13: 2 g via INTRAVENOUS
  Filled 2013-03-13: qty 4

## 2013-03-13 MED ORDER — DEXTROSE-NACL 5-0.45 % IV SOLN
INTRAVENOUS | Status: DC
  Administered 2013-03-13: 10:00:00 via INTRAVENOUS

## 2013-03-13 MED ORDER — HALOPERIDOL LACTATE 5 MG/ML IJ SOLN
INTRAMUSCULAR | Status: AC
  Filled 2013-03-13: qty 1

## 2013-03-13 MED ORDER — PROPOFOL 10 MG/ML IV EMUL
5.0000 ug/kg/min | INTRAVENOUS | Status: DC
  Administered 2013-03-13: 20 ug/kg/min via INTRAVENOUS
  Administered 2013-03-13: 50 ug/kg/min via INTRAVENOUS
  Administered 2013-03-14: 55 ug/kg/min via INTRAVENOUS
  Administered 2013-03-14: 60 ug/kg/min via INTRAVENOUS
  Administered 2013-03-14: 30 ug/kg/min via INTRAVENOUS
  Administered 2013-03-14: 50 ug/kg/min via INTRAVENOUS
  Filled 2013-03-13 (×6): qty 100

## 2013-03-13 MED ORDER — POTASSIUM CHLORIDE 10 MEQ/50ML IV SOLN
10.0000 meq | INTRAVENOUS | Status: AC
  Administered 2013-03-13 (×4): 10 meq via INTRAVENOUS
  Filled 2013-03-13 (×4): qty 50

## 2013-03-13 NOTE — Progress Notes (Signed)
eLink Physician-Brief Progress Note Patient Name: Angel Phillips DOB: [birth year] MRN: [id number]  Date of Service  03/13/2013   HPI/Events of Note   Hypokalemia  eICU Interventions  Potassium replaced   Intervention Category Minor Interventions: Electrolytes abnormality - evaluation and management  DETERDING,ELIZABETH 03/13/2013, 6:48 AM

## 2013-03-13 NOTE — H&P (Signed)
PULMONARY  / CRITICAL CARE MEDICINE  Name: Angel Phillips MRN: [id number] DOB: 11/24/1960    ADMISSION DATE:  03/12/2013 CONSULTATION DATE:  03/12/13  REFERRING MD :  EDP PRIMARY SERVICE: CCM  CHIEF COMPLAINT:  AMS  BRIEF PATIENT DESCRIPTION: 52 year old male, admitted with AMS intubated in ED. History of alcohol binges unknown if chronic use.   SIGNIFICANT EVENTS / STUDIES:  6/14 Admit 6/14 Head CT >>> 6/14 EEG >>>  LINES / TUBES: 8.0 ETT 6/14 >>>  CULTURES: Blood x2 6/14 >>> Urine 6/14 >>>   ANTIBIOTICS: Unasyn 6/14 >>>   HISTORY OF PRESENT ILLNESS:  Angel Phillips is a 52 year old male with a history of alcohol binges but an unclear history of chronic alcohol use. His family noted that it had been at least 4 days since they had been able to contact him and called [facility] PD to check on him on 6/14. Police broke into his home and found him minimally responsive in a chair, covered in urine, feces, and vomit. He was noted to have multiple empty alcohol containers surrounding him.  Upon arrival to the ED he was intermittently combative and minimally responsive. He was intubated for airway protection and for further workup and evaluation. They noted an increased lactate and he also has an AKI.  We are consulted for admission and management. 03/12/13    SUBJECTIVE:   03/13/2013: Calm on Precedex when unstimulated but if stimulated gets very agitated. Weaning on pressure support trial right now  VITAL SIGNS: Temp:  [96.8 F (36 C)-98.3 F (36.8 C)] 97.9 F (36.6 C) (06/15 0700) Pulse Rate:  [65-139] 71 (06/15 0757) Resp:  [16-36] 23 (06/15 0757) BP: (70-178)/(45-106) 154/84 mmHg (06/15 0757) SpO2:  [93 %-100 %] 95 % (06/15 0757) FiO2 (%):  [30 %-100 %] 30 % (06/15 0757) Weight:  [113 kg (249 lb 1.9 oz)-114.9 kg (253 lb 4.9 oz)] 114.9 kg (253 lb 4.9 oz) (06/15 0500) HEMODYNAMICS: CVP:  [0 mmHg-13 mmHg] 13 mmHg VENTILATOR SETTINGS: Vent Mode:  [-] CPAP;PSV FiO2 (%):   [30 %-100 %] 30 % Set Rate:  [16 bmp] 16 bmp Vt Set:  [550 mL-600 mL] 550 mL PEEP:  [5 cmH20] 5 cmH20 Pressure Support:  [5 cmH20] 5 cmH20 Plateau Pressure:  [13 cmH20-20 cmH20] 14 cmH20 INTAKE / OUTPUT: Intake/Output     06/14 0701 - 06/15 0700 06/15 0701 - 06/16 0700   I.V. (mL/kg) 4003.6 (34.8)    NG/GT 30    IV Piggyback 200    Total Intake(mL/kg) 4233.6 (36.8)    Urine (mL/kg/hr) 2415    Total Output 2415     Net +1818.6            PHYSICAL EXAMINATION: General:  Obese male, intubated Neuro:  Sedated. PERRL, does not follow commands but this is on Precedex infusion HEENT:  intubated Cardiovascular:  RRR,. S1 and S2. No JVD. +2 radial and DP pulses Lungs:  Scattered rhonchi anterior lung fields Abdomen:  Round, soft, non-tender. Active BS Musculoskeletal:  Passive motion of extremities Skin:  Pink, warm, intact  LABS: See individual assessment and plan -    Imaging x48 hours Ct Head Wo Contrast  03/12/2013   *RADIOLOGY REPORT*  Clinical Data:  Altered level of consciousness.  Diabetes and hypertension.  CT HEAD WITHOUT CONTRAST CT CERVICAL SPINE WITHOUT CONTRAST  Technique:  Multidetector CT imaging of the head and cervical spine was performed following the standard protocol without intravenous contrast.  Multiplanar CT image reconstructions  of the cervical spine were also generated.  Comparison:  None  CT HEAD  Findings: There is no evidence of intracranial hemorrhage, brain edema or other signs of acute infarction.  There is no evidence of intracranial mass lesion or mass effect.  No abnormal extra-axial fluid collections are identified.  Ventricles normal in size.  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  No other intracranial abnormality identified.  No evidence of skull fracture or other bone lesions.  IMPRESSION: No acute intracranial findings.  CT CERVICAL SPINE  Findings: No evidence of acute cervical spine fracture or  subluxation.  Moderate degenerative disc disease is seen at C5-6 and C6-7.  Atlantoaxial degenerative changes also noted.  No other significant bone abnormality identified.  IMPRESSION:  1.  No evidence of acute cervical spine fracture or subluxation. 2.  Degenerative cervical spondylosis, as described above.   Original Report Authenticated By: Myles Rosenthal, M.D.   Ct Cervical Spine Wo Contrast  03/12/2013   *RADIOLOGY REPORT*  Clinical Data:  Altered level of consciousness.  Diabetes and hypertension.  CT HEAD WITHOUT CONTRAST CT CERVICAL SPINE WITHOUT CONTRAST  Technique:  Multidetector CT imaging of the head and cervical spine was performed following the standard protocol without intravenous contrast.  Multiplanar CT image reconstructions of the cervical spine were also generated.  Comparison:  None  CT HEAD  Findings: There is no evidence of intracranial hemorrhage, brain edema or other signs of acute infarction.  There is no evidence of intracranial mass lesion or mass effect.  No abnormal extra-axial fluid collections are identified.  Ventricles normal in size.  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  No other intracranial abnormality identified.  No evidence of skull fracture or other bone lesions.  IMPRESSION: No acute intracranial findings.  CT CERVICAL SPINE  Findings: No evidence of acute cervical spine fracture or subluxation.  Moderate degenerative disc disease is seen at C5-6 and C6-7.  Atlantoaxial degenerative changes also noted.  No other significant bone abnormality identified.  IMPRESSION:  1.  No evidence of acute cervical spine fracture or subluxation. 2.  Degenerative cervical spondylosis, as described above.   Original Report Authenticated By: Myles Rosenthal, M.D.   Dg Chest Port 1 View  03/12/2013   *RADIOLOGY REPORT*  Clinical Data: Central line placement.  PORTABLE CHEST - 1 VIEW  Comparison: Portable chest 03/12/2013.  Findings: New right IJ  approach central venous catheter is in place.  The tip of the catheter projects over the superior cavoatrial junction.  No pneumothorax identified.  Mild atelectasis is seen in the left lung base, improved.  Right lung is clear. Heart size is normal.  ET tube again noted.  IMPRESSION:  1.  Right IJ catheter tip projects over the superior cavoatrial junction.  Negative for pneumothorax. 2.  Improved subsegmental atelectasis left lung base,   Original Report Authenticated By: Holley Dexter, M.D.   Dg Chest Portable 1 View  03/12/2013   *RADIOLOGY REPORT*  Clinical Data: Altered mental status  PORTABLE CHEST - 1 VIEW  Comparison: None.  Findings: Cardiomediastinal silhouette is unremarkable. Endotracheal tube in place with tip 3.3 cm above the carina.  Poor inspiration.  No acute infiltrate or pulmonary edema.  IMPRESSION: Endotracheal tube in place.  Poor inspiration.  No acute infiltrate or pulmonary edema.   Original Report Authenticated By: Natasha Mead, M.D.    ASSESSMENT / PLAN:  PULMONARY  Recent Labs Lab 03/12/13 1020  03/12/13 1214 03/13/13 0445  PHART  --  7.297* 7.418  PCO2ART  --  43.4 36.0  PO2ART  --  137.0* 83.5  HCO3  --  21.4 22.8  TCO2 22 23 23.9  O2SAT  --  99.0 96.3    A: Acute respiratory failure  alcohol intoxication, could be post-ictal from seizure - 03/13/2013: He is on pressure support trial but not extubatable due to extreme agitation -  P:   - Full vent support  CARDIOVASCULAR    Recent Labs Lab 03/12/13 1743 03/13/13 0010  TROPONINI 0.34* <0.30   No results found for this basename: PROBNP,  in the last 168 hours  A: Hypertension could be alcohol withdrawal driving his hypertension      - 03/13/2013: Maintaining hemodynamics  P:  - MIVF with D5 half-normal saline while NPO - Ativan for withdrawal - Cycle cardiac enzymes with hypertension and tachycardia  RENAL  Recent Labs Lab 03/12/13 1003 03/12/13 1020 03/12/13 1110 03/13/13 0542  NA  141 138  --  141  K 3.0* 3.1*  --  3.3*  CL 95* 99  --  103  CO2 20  --   --  25  GLUCOSE 182* 189*  --  169*  BUN 23 22  --  22  CREATININE 1.48* 2.00*  --  1.28  CALCIUM 8.1*  --   --  7.6*  MG  --   --  2.3 1.9  PHOS  --   --  4.5 2.6    Recent Labs Lab 03/12/13 1021 03/12/13 1205 03/12/13 1206  LATICACIDVEN 6.23* 5.4*  --   PROCALCITON  --   --  0.11      Intake/Output     06/14 0701 - 06/15 0700 06/15 0701 - 06/16 0700   I.V. (mL/kg) 4003.6 (34.8)    NG/GT 30    IV Piggyback 200    Total Intake(mL/kg) 4233.6 (36.8)    Urine (mL/kg/hr) 2415    Total Output 2415     Net +1818.6            A:  Lactic acidosis at admission 03/12/2013. CK only 209 and no evidence of rhabdomyolysis  - 03/13/2013: Acidosis appears improving. He is hypokalemic and hypomagnesemic  P:   - Replace potassium and magnesium - Replete electrolytes as indicated - Follow bmet  GASTROINTESTINAL  Recent Labs Lab 03/12/13 1003 03/12/13 1203  AST 59*  --   ALT 26  --   ALKPHOS 126*  --   BILITOT 0.5  --   PROT 7.2  --   ALBUMIN 3.1*  --   INR 0.98 0.97    A:  Acute Transaminitis likely from ethanol consumption      Elevated Lipase likely from ethanol consumption P:   - NPO to continue - IV thiamine and folate given alcohol history - Protonix for SUP  HEMATOLOGIC  Recent Labs Lab 03/12/13 1003 03/12/13 1020 03/13/13 0542  HGB 15.6 16.3 12.4*  HCT 44.1 48.0 36.0*  WBC 7.9  --  6.4  PLT 231  --  140*    Recent Labs Lab 03/12/13 1003 03/12/13 1203  INR 0.98 0.97    A:  Anemia of critical illness P:  - Follow CBC, currently values likely represent hemoconcentration - - PRBC for hgb </= 6.9gm%    - exceptions are   -  if ACS susepcted/confirmed then transfuse for hgb </= 8.0gm%,  or    -  If septic shock first  24h and scvo2 < 70% then transfuse for hgb </= 9.0gm%   - active bleeding with hemodynamic instability, then transfuse regardless of hemoglobin value   At  at all times try to transfuse 1 unit prbc as possible with exception of active hemorrhage    INFECTIOUS  Recent Labs Lab 03/12/13 1021 03/12/13 1205 03/12/13 1206  LATICACIDVEN 6.23* 5.4*  --   PROCALCITON  --   --  0.11    Recent Labs Lab 03/12/13 1003 03/13/13 0542  WBC 7.9 6.4    A:  ? aspiration P:   - Cover with Unasyn for possible aspiration - F/U cultures - Follow WBC  ENDOCRINE CBG (last 3)   Recent Labs  03/12/13 2359 03/13/13 0405 03/13/13 0714  GLUCAP 100* 143* 177*     A:  Hyperglycemia likely from acute illness, no history of DM   P:   - Check A1c - Follow CBG - Hyperglycemia protocol  NEUROLOGIC  A:  Acute encephalopathy likely from ethanol intoxication, concern for wernicke's  P:   -Precededex for sedation,  (avoid dipirivan due to elevated lipase) - PRN fentanyl and haldol and if needed ativan - Thiamine and folic acid as above - - EEG to evaluate for seizure activity    GLOBAL 03/13/13:  No family at bedside. Son in [place]. Girl firend apparently visited     The patient is critically ill with multiple organ systems failure and requires high complexity decision making for assessment and support, frequent evaluation and titration of therapies, application of advanced monitoring technologies and extensive interpretation of multiple databases.   Critical Care Time devoted to patient care services described in this note is  35  Minutes.  Dr. Kalman Shan, M.D., [facility].C.P Pulmonary and Critical Care Medicine Staff Physician [facility] Pager: [phone], If no answer or between  15:00h - 7:00h: call [phone]  03/13/2013 8:36 AM

## 2013-03-14 ENCOUNTER — Inpatient Hospital Stay (HOSPITAL_COMMUNITY): Payer: Medicare Other

## 2013-03-14 ENCOUNTER — Other Ambulatory Visit (HOSPITAL_COMMUNITY): Payer: Self-pay

## 2013-03-14 DIAGNOSIS — G9340 Encephalopathy, unspecified: Secondary | ICD-10-CM | POA: Diagnosis not present

## 2013-03-14 DIAGNOSIS — J9600 Acute respiratory failure, unspecified whether with hypoxia or hypercapnia: Secondary | ICD-10-CM | POA: Diagnosis not present

## 2013-03-14 LAB — BASIC METABOLIC PANEL
CO2: 26 mEq/L (ref 19–32)
Calcium: 8.3 mg/dL — ABNORMAL LOW (ref 8.4–10.5)
Calcium: 8.2 mg/dL — ABNORMAL LOW (ref 8.4–10.5)
Chloride: 97 mEq/L (ref 96–112)
GFR calc non Af Amer: 67 mL/min — ABNORMAL LOW (ref >90)
Glucose, Bld: 282 mg/dL — ABNORMAL HIGH (ref 70–99)
Potassium: 2.8 mEq/L — ABNORMAL LOW (ref 3.5–5.1)
Sodium: 141 mEq/L (ref 135–145)
Sodium: 138 mEq/L (ref 135–145)

## 2013-03-14 LAB — GLUCOSE, CAPILLARY
Glucose-Capillary: 125 mg/dL — ABNORMAL HIGH (ref 70–99)
Glucose-Capillary: 142 mg/dL — ABNORMAL HIGH (ref 70–99)
Glucose-Capillary: 222 mg/dL — ABNORMAL HIGH (ref 70–99)
Glucose-Capillary: 222 mg/dL — ABNORMAL HIGH (ref 70–99)

## 2013-03-14 LAB — HEPATIC FUNCTION PANEL
Alkaline Phosphatase: 119 U/L — ABNORMAL HIGH (ref 39–117)
Indirect Bilirubin: 0.6 mg/dL (ref 0.3–0.9)
Total Bilirubin: 1.1 mg/dL (ref 0.3–1.2)

## 2013-03-14 LAB — URINE CULTURE
Culture: NO GROWTH
Special Requests: NORMAL

## 2013-03-14 LAB — CBC
MCH: 29.6 pg (ref 26.0–34.0)
MCHC: 34.9 g/dL (ref 30.0–36.0)
Platelets: 118 10*3/uL — ABNORMAL LOW (ref 150–400)

## 2013-03-14 LAB — PHOSPHORUS
Phosphorus: 1 mg/dL — CL (ref 2.3–4.6)
Phosphorus: 1 mg/dL — CL (ref 2.3–4.6)

## 2013-03-14 LAB — MAGNESIUM
Magnesium: 1.8 mg/dL (ref 1.5–2.5)
Magnesium: 1.5 mg/dL (ref 1.5–2.5)

## 2013-03-14 MED ORDER — MAGNESIUM SULFATE 50 % IJ SOLN
6.0000 g | Freq: Once | INTRAVENOUS | Status: AC
  Administered 2013-03-14: 6 g via INTRAVENOUS
  Filled 2013-03-14: qty 12

## 2013-03-14 MED ORDER — POTASSIUM PHOSPHATE DIBASIC 3 MMOLE/ML IV SOLN: 10.0000 mmol | Freq: Once | INTRAVENOUS | Status: DC

## 2013-03-14 MED ORDER — LORAZEPAM 2 MG/ML IJ SOLN: 1.0000 mg | Freq: Four times a day (QID) | INTRAMUSCULAR | Status: DC | PRN

## 2013-03-14 MED ORDER — LORAZEPAM 1 MG PO TABS: 0.0000 mg | ORAL_TABLET | Freq: Four times a day (QID) | ORAL | Status: DC

## 2013-03-14 MED ORDER — LORAZEPAM 1 MG PO TABS: 1.0000 mg | ORAL_TABLET | Freq: Four times a day (QID) | ORAL | Status: DC | PRN

## 2013-03-14 MED ORDER — SODIUM CHLORIDE 0.9 % IV SOLN
25.0000 ug/h | INTRAVENOUS | Status: DC
  Administered 2013-03-14 (×2): 50 ug/h via INTRAVENOUS
  Filled 2013-03-14: qty 50

## 2013-03-14 MED ORDER — POTASSIUM CHLORIDE 10 MEQ/50ML IV SOLN
10.0000 meq | INTRAVENOUS | Status: AC
  Administered 2013-03-14 (×4): 10 meq via INTRAVENOUS
  Filled 2013-03-14: qty 200

## 2013-03-14 MED ORDER — POTASSIUM PHOSPHATE DIBASIC 3 MMOLE/ML IV SOLN
30.0000 mmol | Freq: Once | INTRAVENOUS | Status: AC
  Administered 2013-03-14: 30 mmol via INTRAVENOUS
  Filled 2013-03-14: qty 10

## 2013-03-14 MED ORDER — SODIUM PHOSPHATE 3 MMOLE/ML IV SOLN: 20.0000 mmol | Freq: Once | INTRAVENOUS | Status: DC

## 2013-03-14 MED ORDER — PANTOPRAZOLE SODIUM 40 MG PO TBEC
40.0000 mg | DELAYED_RELEASE_TABLET | Freq: Every day | ORAL | Status: DC
  Administered 2013-03-14 – 2013-03-17 (×4): 40 mg via ORAL
  Filled 2013-03-14 (×3): qty 1

## 2013-03-14 MED ORDER — ZOLPIDEM TARTRATE 5 MG PO TABS: 5.0000 mg | ORAL_TABLET | Freq: Every evening | ORAL | Status: DC | PRN

## 2013-03-14 MED ORDER — MENTHOL 3 MG MT LOZG
1.0000 | LOZENGE | OROMUCOSAL | Status: DC | PRN
  Administered 2013-03-14 – 2013-03-17 (×10): 3 mg via ORAL
  Filled 2013-03-14 (×3): qty 9

## 2013-03-14 MED ORDER — LORAZEPAM 1 MG PO TABS: 0.0000 mg | ORAL_TABLET | Freq: Two times a day (BID) | ORAL | Status: DC

## 2013-03-14 MED ORDER — VITAMIN B-1 100 MG PO TABS
100.0000 mg | ORAL_TABLET | Freq: Every day | ORAL | Status: DC
  Administered 2013-03-15 – 2013-03-17 (×3): 100 mg via ORAL
  Filled 2013-03-14 (×4): qty 1

## 2013-03-14 MED ORDER — THIAMINE HCL 100 MG/ML IJ SOLN
100.0000 mg | Freq: Every day | INTRAMUSCULAR | Status: DC
  Filled 2013-03-14: qty 1

## 2013-03-14 MED ORDER — LORAZEPAM 1 MG PO TABS
1.0000 mg | ORAL_TABLET | Freq: Four times a day (QID) | ORAL | Status: DC | PRN
  Administered 2013-03-14: 1 mg via ORAL
  Filled 2013-03-14: qty 1

## 2013-03-14 MED ORDER — LORAZEPAM 2 MG/ML IJ SOLN
1.0000 mg | Freq: Four times a day (QID) | INTRAMUSCULAR | Status: DC | PRN
  Administered 2013-03-14 – 2013-03-15 (×3): 4 mg via INTRAVENOUS
  Filled 2013-03-14 (×3): qty 2

## 2013-03-14 MED ORDER — FOLIC ACID 1 MG PO TABS
1.0000 mg | ORAL_TABLET | Freq: Every day | ORAL | Status: DC
  Administered 2013-03-15 – 2013-03-17 (×3): 1 mg via ORAL
  Filled 2013-03-14 (×4): qty 1

## 2013-03-14 MED ORDER — ADULT MULTIVITAMIN W/MINERALS CH
1.0000 | ORAL_TABLET | Freq: Every day | ORAL | Status: DC
  Administered 2013-03-15 – 2013-03-17 (×3): 1 via ORAL
  Filled 2013-03-14 (×4): qty 1

## 2013-03-14 MED ORDER — ZOLPIDEM TARTRATE 5 MG PO TABS
5.0000 mg | ORAL_TABLET | Freq: Every evening | ORAL | Status: DC | PRN
  Administered 2013-03-14 – 2013-03-16 (×3): 5 mg via ORAL
  Filled 2013-03-14 (×3): qty 1

## 2013-03-14 MED ORDER — FENTANYL BOLUS VIA INFUSION
25.0000 ug | Freq: Four times a day (QID) | INTRAVENOUS | Status: DC | PRN
  Filled 2013-03-14: qty 100

## 2013-03-14 NOTE — H&P (Signed)
PULMONARY  / CRITICAL CARE MEDICINE  Name: Angel Phillips MRN: [id number] DOB: 08/01/1961    ADMISSION DATE:  03/12/2013 CONSULTATION DATE:  03/12/13  REFERRING MD :  EDP PRIMARY SERVICE: CCM  CHIEF COMPLAINT:  AMS  BRIEF PATIENT DESCRIPTION: 52 year old male, binge drinker, admitted with AMS & intubated in ED due to altering agitation / somnolence.   SIGNIFICANT EVENTS:  6/14 - Admit with AMS, intubated   STUDIES: 6/14 Head CT / Neck>>>no cervical abnormality, no acute intracranial findings 6/14 EEG >>>  LINES / TUBES: ETT 6/14 >>>6/16 R IJ TLC 6/14>>>  CULTURES: Blood x2 6/14 >>> Urine 6/14 >>> negative  ANTIBIOTICS: Unasyn 6/14 >>>   SUBJECTIVE: RT reports pt on 5/5 wean since 0730, tolerating well  VITAL SIGNS: Temp:  [99 F (37.2 C)-100.6 F (38.1 C)] 99 F (37.2 C) (06/16 0800) Pulse Rate:  [61-99] 99 (06/16 1213) Resp:  [14-26] 25 (06/16 1213) BP: (88-170)/(49-93) 141/69 mmHg (06/16 1213) SpO2:  [97 %-100 %] 100 % (06/16 1213) FiO2 (%):  [30 %] 30 % (06/16 1213) Weight:  [253 lb 8.5 oz (115 kg)] 253 lb 8.5 oz (115 kg) (06/16 0500)  HEMODYNAMICS: CVP:  [6 mmHg-18 mmHg] 12 mmHg  VENTILATOR SETTINGS: Vent Mode:  [-] CPAP;PSV FiO2 (%):  [30 %] 30 % Set Rate:  [16 bmp] 16 bmp Vt Set:  [550 mL] 550 mL PEEP:  [5 cmH20] 5 cmH20 Pressure Support:  [5 cmH20] 5 cmH20 Plateau Pressure:  [12 cmH20-18 cmH20] 12 cmH20  INTAKE / OUTPUT: Intake/Output     06/15 0701 - 06/16 0700 06/16 0701 - 06/17 0700   I.V. (mL/kg) 2159.6 (18.8) 347.1 (3)   NG/GT 20 30   IV Piggyback 385 85   Total Intake(mL/kg) 2564.6 (22.3) 462.1 (4)   Urine (mL/kg/hr) 2460 (0.9) 255 (0.4)   Emesis/NG output 620 (0.2) 400 (0.7)   Total Output 3080 655   Net -515.4 -192.9          PHYSICAL EXAMINATION: General:  Obese male, NAD Neuro:  Awake, follows commands HEENT:  Mm pink/moist Cardiovascular: s1s2 rrr, no m/r/g Lungs:resp's even/non-labored on PSV, pulling volumes of  3L Abdomen:  Round, soft, non-tender. Active BS Musculoskeletal:  No acute deformities Skin:  Pink, warm, intact  LABS:   Recent Labs Lab 03/12/13 1003 03/12/13 1020 03/13/13 0542 03/14/13 0405  HGB 15.6 16.3 12.4* 12.6*  HCT 44.1 48.0 36.0* 36.1*  WBC 7.9  --  6.4 7.1  PLT 231  --  140* 118*    Recent Labs Lab 03/12/13 1003 03/12/13 1020 03/12/13 1110 03/13/13 0542 03/14/13 0405  NA 141 138  --  141 141  K 3.0* 3.1*  --  3.3* 2.9*  CL 95* 99  --  103 101  CO2 20  --   --  25 29  GLUCOSE 182* 189*  --  169* 143*  BUN 23 22  --  22 18  CREATININE 1.48* 2.00*  --  1.28 1.20  CALCIUM 8.1*  --   --  7.6* 8.3*  MG  --   --  2.3 1.9 1.8  PHOS  --   --  4.5 2.6 1.0*   Imaging: Dg Chest Port 1 View  03/14/2013   *RADIOLOGY REPORT*  Clinical Data: Check endotracheal tube  PORTABLE CHEST - 1 VIEW  Comparison: 03/12/13  Findings: Endotracheal tube, endotracheal tube and right central venous line are unchanged.  Interval placement of an NG tube with tip in the stomach.  Normal cardiac silhouette. Local lung volumes. No pulmonary edema.  No pneumothorax.  IMPRESSION:  1.  Stable support apparatus with placement of NG tube. 2.  Low lung volumes. 3.  No interval change   Original Report Authenticated By: Genevive Bi, M.D.   Dg Chest Port 1 View  03/12/2013   *RADIOLOGY REPORT*  Clinical Data: Central line placement.  PORTABLE CHEST - 1 VIEW  Comparison: Portable chest 03/12/2013.  Findings: New right IJ approach central venous catheter is in place.  The tip of the catheter projects over the superior cavoatrial junction.  No pneumothorax identified.  Mild atelectasis is seen in the left lung base, improved.  Right lung is clear. Heart size is normal.  ET tube again noted.  IMPRESSION:  1.  Right IJ catheter tip projects over the superior cavoatrial junction.  Negative for pneumothorax. 2.  Improved subsegmental atelectasis left lung base,   Original Report Authenticated By: Holley Dexter, M.D.    ASSESSMENT / PLAN:  PULMONARY A:  Acute respiratory failure - in the setting of AMS r/t alcohol intoxication, questionable seizure activity.  P:   -meets extubation criteria 6/16, extubate to [place] O2 -d/c sedation protocol  CARDIOVASCULAR A:  Hypertension - hx of HTN, pt reports he uses lisinopril, HCTZ at home.  Currently normotensive. Tachycardia  P:  -monitor BP, HR trend -will ask pharmacy to contact [facility] to obtain med list  RENAL A:  Lactic acidosis - noted on admit,  CK only 209 and no evidence of rhabdomyolysis Hypokalemia Hypomagnesemia Hypophosphatemia  P:   -Replete electrolytes as indicated -f/u BMP at 1400 & in am  GASTROINTESTINAL A:   Acute Transaminitis - in setting of ethanol consumption Elevated Lipase likely from ethanol consumption Gastric Bypass (hx of) P:   - NPO for 4 hours then advance to clear liquids - IV thiamine and folate given alcohol history  HEMATOLOGIC A:   Anemia of critical illness P:  -Follow CBC    INFECTIOUS A:   Concern for aspiration P:   - Cover with Unasyn for possible aspiration - F/U cultures - Follow WBC / fever curve  ENDOCRINE A:   Hyperglycemia - likely from acute illness, no history of DM   P:   - Follow CBG - Hyperglycemia protocol  NEUROLOGIC  A:   Acute encephalopathy - likely from ethanol intoxication, concern for wernicke's  ETOH Abuse - pt reports he drinks "as much as he can get his hands on" P:   -Thiamine and folic acid -CIWA protocol with scheduled PO ativan   Canary Brim, NP-C [facility] Pgr: [phone] or [phone]  03/14/2013 12:15 PM   Reviewed above, examined pt, and agree with assessment/plan.  Did well with SBT and successfully extubated.  Will need to monitor for DT's.  Continue unasyn for now.  D/c foley.  Keep CVL in for now.  Advance diet as tolerated.  OOB to chair, PT/OT evaluation.  If stable, then transfer out of ICU  6/17.  Coralyn Helling, MD [facility] Pulmonary/Critical Care 03/14/2013, 2:08 PM Pager:  [phone] After 3pm call: [phone]

## 2013-03-14 NOTE — Progress Notes (Signed)
eLink Physician-Brief Progress Note Patient Name: Tres Grzywacz DOB: 03-28-1961 MRN: [id number]  Date of Service  03/14/2013   HPI/Events of Note   Low phos, k mag  eICU Interventions  Replace IV   Intervention Category Major Interventions: Electrolyte abnormality - evaluation and management  Ashraf Mesta J. 03/14/2013, 5:36 PM

## 2013-03-14 NOTE — Progress Notes (Signed)
eLink Physician-Brief Progress Note Patient Name: Angel Phillips DOB: [birth year] MRN: [id number]  Date of Service  03/14/2013   HPI/Events of Note   electrolytes - K, Phos  eICU Interventions  repleted   Intervention Category Minor Interventions: Electrolytes abnormality - evaluation and management  [name] S. 03/14/2013, 5:14 AM

## 2013-03-14 NOTE — Procedures (Signed)
Extubation Procedure Note  Patient Details:   Name: Angel Phillips DOB: Mar 09, 1961 MRN: [id number]   Airway Documentation:     Evaluation  O2 sats: stable throughout Complications: No apparent complications Patient did tolerate procedure well. Bilateral Breath Sounds: Diminished Suctioning: Airway Yes  Ave Filter 03/14/2013, 12:32 PM

## 2013-03-14 NOTE — Progress Notes (Signed)
UR Completed.  Angel Phillips Jane [phone] 03/14/2013  

## 2013-03-14 NOTE — Progress Notes (Signed)
INITIAL NUTRITION ASSESSMENT  DOCUMENTATION CODES Per approved criteria  -Obesity Unspecified   INTERVENTION: 1.  Enteral nutrition; If pt remains intubated, recommend initiation of Prostat 10 times daily to provide 1000 kcal, 165g protein.   Total nutrition support to be provided from diprivan + TF= 2074 kcal, 150g protein   NUTRITION DIAGNOSIS: Inadequate oral intake related to inability to eat as evidenced by mechanical ventilation, NPO.   Goal: Enteral nutrition to provide 60-70% of estimated calorie needs (22-25 kcals/kg ideal body weight) and 100% of estimated protein needs, based on ASPEN guidelines for permissive underfeeding in critically ill obese individuals.  Monitor:  TF initiation/tolerance, vent status, wt, I/Os  Reason for Assessment: vent  52 y.o. male  Admitting Dx: AMS  ASSESSMENT: Pt with AMS and intoxication, intubated in the ED. Patient is currently intubated on ventilator support.  MV: 8.3 L/min Temp:Temp (24hrs), Avg:99.8 F (37.7 C), Min:99 F (37.2 C), Max:100.6 F (38.1 C)  Propofol: bolusing @ 40.7 ml/hr which provides 1074 kcal/day.  Pt consistently requiring high levels of diprivan between 20.7-48.3 mL/hr since admission with level of agitation.   No family at bedside.  Pt with extreme agitation preventing extubation.   Height: Ht Readings from Last 1 Encounters:  03/12/13 6' (1.829 m)    Weight: Wt Readings from Last 1 Encounters:  03/14/13 253 lb 8.5 oz (115 kg)    Ideal Body Weight: 80.9 kg  % Ideal Body Weight: 142%  Wt Readings from Last 10 Encounters:  03/14/13 253 lb 8.5 oz (115 kg)    Usual Body Weight: unknown  % Usual Body Weight: unknown  BMI:  Body mass index is 34.38 kg/(m^2).  Estimated Nutritional Needs: Kcal: 2359 Permissive underfeeding goals: 1780-2020 kcal Protein: 161g Fluid: 2.2-2.4 L/day  Skin: no issues noted  Diet Order:  NPO  EDUCATION NEEDS: -Education not appropriate at this  time   Intake/Output Summary (Last 24 hours) at 03/14/13 0949 Last data filed at 03/14/13 0900  Gross per 24 hour  Intake 2373.84 ml  Output   3145 ml  Net -771.16 ml    Last BM: PTA  Labs:   Recent Labs Lab 03/12/13 1003 03/12/13 1020 03/12/13 1110 03/13/13 0542 03/14/13 0405  NA 141 138  --  141 141  K 3.0* 3.1*  --  3.3* 2.9*  CL 95* 99  --  103 101  CO2 20  --   --  25 29  BUN 23 22  --  22 18  CREATININE 1.48* 2.00*  --  1.28 1.20  CALCIUM 8.1*  --   --  7.6* 8.3*  MG  --   --  2.3 1.9 1.8  PHOS  --   --  4.5 2.6 1.0*  GLUCOSE 182* 189*  --  169* 143*    CBG (last 3)   Recent Labs  03/14/13 0005 03/14/13 0357 03/14/13 0736  GLUCAP 114* 125* 183*   No results found for this basename: HGBA1C   Lipase     Component Value Date/Time   LIPASE 200* 03/12/2013 1003    Scheduled Meds: . ampicillin-sulbactam (UNASYN) IV  3 g Intravenous Q8H  . antiseptic oral rinse  15 mL Mouth Rinse QID  . chlorhexidine  15 mL Mouth Rinse BID  . folic acid  1 mg Intravenous Daily  . insulin aspart  2-6 Units Subcutaneous Q4H  . pantoprazole (PROTONIX) IV  40 mg Intravenous QHS  . potassium phosphate IVPB (mmol)  30 mmol Intravenous Once  .  thiamine  100 mg Intravenous Daily    Continuous Infusions: . dextrose 5 % and 0.45% NaCl 50 mL/hr at 03/14/13 0900  . fentaNYL infusion INTRAVENOUS 50 mcg/hr (03/14/13 0919)  . propofol 60 mcg/kg/min (03/14/13 7253)    Past Medical History  Diagnosis Date  . Hypertension   . Diabetes mellitus without complication     History reviewed. No pertinent past surgical history.  Loyce Dys, MS RD LDN Clinical Inpatient Dietitian Pager: [phone] Weekend/After hours pager: [phone]

## 2013-03-14 NOTE — Progress Notes (Signed)
eLink Physician-Brief Progress Note Patient Name: Angel Phillips DOB: 04-22-1961 MRN: [id number]  Date of Service  03/14/2013   HPI/Events of Note  Pt increasingly agitated and uncomfortable on high dose propofol.      eICU Interventions  Add fentanyl gtt   Intervention Category Minor Interventions: Agitation / anxiety - evaluation and management  BYRUM,ROBERT S. 03/14/2013, 3:48 AM

## 2013-03-14 NOTE — Progress Notes (Signed)
eLink Physician-Brief Progress Note Patient Name: Angel Phillips DOB: 1961-03-29 MRN: [id number]  Date of Service  03/14/2013   HPI/Events of Note   Soar throat post extub  eICU Interventions  cepacol   Intervention Category Minor Interventions: Routine modifications to care plan (e.g. PRN medications for pain, fever)  Nelda Bucks. 03/14/2013, 4:22 PM

## 2013-03-15 ENCOUNTER — Inpatient Hospital Stay (HOSPITAL_COMMUNITY): Payer: Medicare Other

## 2013-03-15 DIAGNOSIS — R7309 Other abnormal glucose: Secondary | ICD-10-CM

## 2013-03-15 DIAGNOSIS — G9340 Encephalopathy, unspecified: Secondary | ICD-10-CM | POA: Diagnosis not present

## 2013-03-15 DIAGNOSIS — J9600 Acute respiratory failure, unspecified whether with hypoxia or hypercapnia: Secondary | ICD-10-CM | POA: Diagnosis not present

## 2013-03-15 LAB — GLUCOSE, CAPILLARY
Glucose-Capillary: 212 mg/dL — ABNORMAL HIGH (ref 70–99)
Glucose-Capillary: 184 mg/dL — ABNORMAL HIGH (ref 70–99)
Glucose-Capillary: 228 mg/dL — ABNORMAL HIGH (ref 70–99)
Glucose-Capillary: 112 mg/dL — ABNORMAL HIGH (ref 70–99)

## 2013-03-15 LAB — BASIC METABOLIC PANEL
CO2: 31 mEq/L (ref 19–32)
CO2: 26 mEq/L (ref 19–32)
Calcium: 9 mg/dL (ref 8.4–10.5)
Chloride: 97 mEq/L (ref 96–112)
Creatinine, Ser: 0.89 mg/dL (ref 0.50–1.35)
Glucose, Bld: 181 mg/dL — ABNORMAL HIGH (ref 70–99)
Glucose, Bld: 213 mg/dL — ABNORMAL HIGH (ref 70–99)
Potassium: 2.9 mEq/L — ABNORMAL LOW (ref 3.5–5.1)
Sodium: 138 mEq/L (ref 135–145)

## 2013-03-15 LAB — PHOSPHORUS: Phosphorus: 2.4 mg/dL (ref 2.3–4.6)

## 2013-03-15 MED ORDER — GLIPIZIDE 5 MG PO TABS
15.0000 mg | ORAL_TABLET | Freq: Two times a day (BID) | ORAL | Status: DC
  Administered 2013-03-15 – 2013-03-16 (×2): 15 mg via ORAL
  Filled 2013-03-15 (×4): qty 1

## 2013-03-15 MED ORDER — POTASSIUM CHLORIDE CRYS ER 20 MEQ PO TBCR
40.0000 meq | EXTENDED_RELEASE_TABLET | ORAL | Status: AC
Start: 2013-03-15 — End: 2013-03-15
  Administered 2013-03-15 (×2): 40 meq via ORAL
  Filled 2013-03-15 (×2): qty 2

## 2013-03-15 MED ORDER — METOPROLOL TARTRATE 25 MG PO TABS
25.0000 mg | ORAL_TABLET | Freq: Two times a day (BID) | ORAL | Status: DC
  Administered 2013-03-15 (×2): 25 mg via ORAL
  Filled 2013-03-15 (×4): qty 1

## 2013-03-15 MED ORDER — METFORMIN HCL 500 MG PO TABS: 1000.0000 mg | ORAL_TABLET | Freq: Two times a day (BID) | ORAL | Status: DC

## 2013-03-15 MED ORDER — INSULIN ASPART 100 UNIT/ML ~~LOC~~ SOLN
0.0000 [IU] | Freq: Three times a day (TID) | SUBCUTANEOUS | Status: DC
  Administered 2013-03-15: 3 [IU] via SUBCUTANEOUS
  Administered 2013-03-15 – 2013-03-16 (×2): 5 [IU] via SUBCUTANEOUS
  Administered 2013-03-16 – 2013-03-17 (×3): 3 [IU] via SUBCUTANEOUS

## 2013-03-15 MED ORDER — GLUCERNA SHAKE PO LIQD
237.0000 mL | Freq: Two times a day (BID) | ORAL | Status: DC
  Administered 2013-03-15 – 2013-03-16 (×2): 237 mL via ORAL

## 2013-03-15 MED ORDER — METFORMIN HCL 500 MG PO TABS
1000.0000 mg | ORAL_TABLET | Freq: Two times a day (BID) | ORAL | Status: DC
  Filled 2013-03-15 (×2): qty 2

## 2013-03-15 MED ORDER — POTASSIUM CHLORIDE 10 MEQ/50ML IV SOLN
10.0000 meq | INTRAVENOUS | Status: DC
  Administered 2013-03-15 (×4): 10 meq via INTRAVENOUS
  Filled 2013-03-15: qty 100
  Filled 2013-03-15: qty 300

## 2013-03-15 MED ORDER — AMOXICILLIN-POT CLAVULANATE 875-125 MG PO TABS
1.0000 | ORAL_TABLET | Freq: Two times a day (BID) | ORAL | Status: DC
  Administered 2013-03-15 – 2013-03-17 (×5): 1 via ORAL
  Filled 2013-03-15 (×6): qty 1

## 2013-03-15 NOTE — Progress Notes (Signed)
ANTIBIOTIC CONSULT NOTE - FOLLOW UP  Pharmacy Consult:  Unasyn Indication:  Possible aspiration PNA  No Known Allergies  Patient Measurements: Height: 6' (182.9 cm) Weight: 253 lb 8.5 oz (115 kg) IBW/kg (Calculated) : 77.6  Vital Signs: Temp: 98.6 F (37 C) (06/17 0751) Temp src: Oral (06/17 0751) BP: 162/79 mmHg (06/17 0800) Pulse Rate: 95 (06/17 0800) Intake/Output from previous day: 06/16 0701 - 06/17 0700 In: 1924.3 [P.O.:780; I.V.:729.3; NG/GT:30; IV Piggyback:385] Out: 6150 [Urine:5650; Emesis/NG output:500] Intake/Output from this shift: Total I/O In: -  Out: 400 [Urine:400]  Labs:  Recent Labs  03/12/13 1003 03/12/13 1020 03/13/13 0542 03/14/13 0405 03/14/13 1605 03/15/13 0358  WBC 7.9  --  6.4 7.1  --   --   HGB 15.6 16.3 12.4* 12.6*  --   --   PLT 231  --  140* 118*  --   --   CREATININE 1.48* 2.00* 1.28 1.20 1.19 0.96   Estimated Creatinine Clearance: 116.6 ml/min (by C-G formula based on Cr of 0.96). No results found for this basename: VANCOTROUGH, VANCOPEAK, VANCORANDOM, GENTTROUGH, GENTPEAK, GENTRANDOM, TOBRATROUGH, TOBRAPEAK, TOBRARND, AMIKACINPEAK, AMIKACINTROU, AMIKACIN,  in the last 72 hours   Microbiology: Recent Results (from the past 720 hour(s))  CULTURE, BLOOD (ROUTINE X 2)     Status: None   Collection Time    03/12/13 11:05 AM      Result Value Range Status   Specimen Description BLOOD RIGHT HAND   Final   Special Requests BOTTLES DRAWN AEROBIC AND ANAEROBIC 10CC   Final   Culture  Setup Time 03/12/2013 18:52   Final   Culture     Final   Value:        BLOOD CULTURE RECEIVED NO GROWTH TO DATE CULTURE WILL BE HELD FOR 5 DAYS BEFORE ISSUING A FINAL NEGATIVE REPORT   Report Status PENDING   Incomplete  CULTURE, BLOOD (ROUTINE X 2)     Status: None   Collection Time    03/12/13 11:24 AM      Result Value Range Status   Specimen Description BLOOD RIGHT ARM   Final   Special Requests BOTTLES DRAWN AEROBIC AND ANAEROBIC 10CC   Final   Culture  Setup Time 03/12/2013 18:52   Final   Culture     Final   Value:        BLOOD CULTURE RECEIVED NO GROWTH TO DATE CULTURE WILL BE HELD FOR 5 DAYS BEFORE ISSUING A FINAL NEGATIVE REPORT   Report Status PENDING   Incomplete  URINE CULTURE     Status: None   Collection Time    03/12/13 11:45 AM      Result Value Range Status   Specimen Description URINE, CATHETERIZED   Final   Special Requests Normal   Final   Culture  Setup Time 03/13/2013 02:39   Final   Colony Count NO GROWTH   Final   Culture NO GROWTH   Final   Report Status 03/14/2013 FINAL   Final  MRSA PCR SCREENING     Status: None   Collection Time    03/12/13  5:25 PM      Result Value Range Status   MRSA by PCR NEGATIVE  NEGATIVE Final   Comment:            The GeneXpert MRSA Assay (FDA     approved for NASAL specimens     only), is one component of a     comprehensive MRSA colonization  surveillance program. It is not     intended to diagnose MRSA     infection nor to guide or     monitor treatment for     MRSA infections.         Assessment: 21 YOM admitted with AMS and started on Unasyn for possible aspiration PNA.  Patient is improving overall.  His renal function has also improved.  Unasyn 6/14 >>>  6/14 Blood x2 - NGTD 6/14 Urine 6/14 - negative   Goal of Therapy:  Infection prevention / Clearance of infection   Plan:  - Unasyn 3g IV Q8H - Monitor renal fxn, culture results, abx LOT     Conswella Bruney D. Laney Potash, PharmD, BCPS Pager:  [phone] 03/15/2013, 8:54 AM

## 2013-03-15 NOTE — Evaluation (Signed)
Physical Therapy Evaluation Patient Details Name: Angel Phillips MRN: [id number] DOB: [birth year] Today's Date: 03/15/2013 Time: 7829-5621 PT Time Calculation (min): 29 min  PT Assessment / Plan / Recommendation Clinical Impression  Pt admitted with acute alcohol induced encephalopathy. Pt reports he lives alone but his son will be coming to move in in two days. Pt reports he nor family work and that they can care for him. Pt incontinent of bowel and bladder during session as well as unable to maintain saliva during tx despite cueing. Pt exhibits decreased, strength, balance, safety and function and will benefit from acute therapy to address this deficits.  Requested pt have family bring leg braces to hospital to assist with function.      PT Assessment  Patient needs continued PT services    Follow Up Recommendations  Supervision/Assistance - 24 hour;SNF    Does the patient have the potential to tolerate intense rehabilitation      Barriers to Discharge Decreased caregiver support      Equipment Recommendations  Rolling walker with 5" wheels    Recommendations for Other Services     Frequency Min 3X/week    Precautions / Restrictions Precautions Precautions: Fall Precaution Comments: pt reports he needs bil leg braces but not present    Pertinent Vitals/Pain No pain      Mobility  Bed Mobility Bed Mobility: Supine to Sit;Sitting - Scoot to Edge of Bed Supine to Sit: 4: Min guard;HOB elevated;With rails Sitting - Scoot to Edge of Bed: 5: Supervision Details for Bed Mobility Assistance: cueing for sequence and safety Transfers Transfers: Sit to Stand;Stand to Sit;Stand Pivot Transfers Sit to Stand: 1: +2 Total assist;From bed Sit to Stand: Patient Percentage: 50% Stand to Sit: 1: +2 Total assist;To bed Stand to Sit: Patient Percentage: 50% Stand Pivot Transfers: 1: +2 Total assist Stand Pivot Transfers: Patient Percentage: 50% Details for Transfer Assistance: pt with  bil knees blocked with standing x 2 trials first 1 min for pericare with pt tendency to revert to crouched posture with anterior lean and mod assist to maintain balance and standing. Max cueing with short pivotal steps to other bed for transfer of rooms Ambulation/Gait Ambulation/Gait Assistance: Not tested (comment)    Exercises     PT Diagnosis: Difficulty walking;Generalized weakness  PT Problem List: Decreased strength;Decreased mobility;Decreased activity tolerance;Decreased balance;Decreased safety awareness;Decreased knowledge of use of DME PT Treatment Interventions: Functional mobility training;Balance training;Therapeutic exercise;Therapeutic activities;Patient/family education;Gait training;DME instruction   PT Goals Acute Rehab PT Goals PT Goal Formulation: With patient Time For Goal Achievement: 03/29/13 Potential to Achieve Goals: Fair Pt will Sit at [facility] of Bed: Independently;3-5 min;with no upper extremity support PT Goal: Sit at Edge Of Bed - Progress: Goal set today Pt will go Sit to Stand: with min assist PT Goal: Sit to Stand - Progress: Goal set today Pt will go Stand to Sit: with min assist PT Goal: Stand to Sit - Progress: Goal set today Pt will Transfer Bed to Chair/Chair to Bed: with min assist PT Transfer Goal: Bed to Chair/Chair to Bed - Progress: Goal set today Pt will Ambulate: 1 - 15 feet;with mod assist;with least restrictive assistive device PT Goal: Ambulate - Progress: Goal set today  Visit Information  Last PT Received On: 03/15/13 Assistance Needed: +2    Subjective Data  Subjective: I wear these things on my legs [facility] Patient Stated Goal: be able to go back to the [place]   Prior Functioning  Home Living  Lives With: Alone Type of Home: Apartment Home Access: Stairs to enter [employer] of Steps: flight Entrance Stairs-Rails: Right;Left;Can reach both Home Layout: One level Bathroom Shower/Tub: Agricultural engineer: Standard Home Adaptive Equipment: Other (comment) (bil leg braces per pt report) Prior Function Level of Independence: Independent Able to Take Stairs?: Yes Driving: Yes Vocation: On disability Communication Communication: No difficulties    Cognition  Cognition Arousal/Alertness: Awake/alert Behavior During Therapy: WFL for tasks assessed/performed Overall Cognitive Status: Impaired/Different from baseline Area of Impairment: Following commands;Safety/judgement Following Commands: Follows one step commands consistently Safety/Judgement: Decreased awareness of safety;Decreased awareness of deficits General Comments: Pt impulsive at times trying to move without warning    Extremity/Trunk Assessment Right Lower Extremity Assessment RLE ROM/Strength/Tone: Deficits RLE ROM/Strength/Tone Deficits: grossly2+/5  RLE Sensation: WFL - Light Touch Left Lower Extremity Assessment LLE ROM/Strength/Tone: Deficits LLE ROM/Strength/Tone Deficits: grossly2+/5  LLE Sensation: WFL - Light Touch Trunk Assessment Trunk Assessment: Normal   Balance Balance Balance Assessed: Yes Static Sitting Balance Static Sitting - Balance Support: Feet supported;Bilateral upper extremity supported Static Sitting - Level of Assistance: 4: Min assist Static Sitting - Comment/# of Minutes: 5 Static Standing Balance Static Standing - Balance Support: Bilateral upper extremity supported Static Standing - Level of Assistance: 1: +2 Total assist Static Standing - Comment/# of Minutes: pt with tendency to crouch with assist for posture and balance with max cueing  End of Session PT - End of Session Equipment Utilized During Treatment: Gait belt Activity Tolerance: Patient tolerated treatment well Patient left: in bed;with call bell/phone within reach;with nursing in room Nurse Communication: Mobility status  GP     Delorse Lek 03/15/2013, 12:52 PM Delaney Meigs, PT [phone]

## 2013-03-15 NOTE — Progress Notes (Addendum)
PULMONARY  / CRITICAL CARE MEDICINE  Name: Angel Phillips MRN: [id number] DOB: June 13, 1961    ADMISSION DATE:  03/12/2013 CONSULTATION DATE:  03/12/13  REFERRING MD :  EDP PRIMARY SERVICE: CCM  CHIEF COMPLAINT:  AMS  BRIEF PATIENT DESCRIPTION: 52 year old male, binge drinker, admitted with AMS & intubated in ED due to altering agitation / somnolence.   SIGNIFICANT EVENTS:  6/14 - Admit with AMS, intubated  6/16 - extubated  STUDIES: 6/14 Head CT / Neck>>>no cervical abnormality, no acute intracranial findings  LINES / TUBES: ETT 6/14 >>>6/16 R IJ TLC 6/14>>>6/17  CULTURES: Blood x2 6/14 >>> Urine 6/14 >>> negative  ANTIBIOTICS: Unasyn 6/14 >>>6/17 Augmentin 6/17 >>>   SUBJECTIVE:  Hiccups.  Denies chest pain.  Breathing better.  Denies abdominal pain.  VITAL SIGNS: Temp:  [98.6 F (37 C)-100.5 F (38.1 C)] 98.6 F (37 C) (06/17 0751) Pulse Rate:  [87-113] 98 (06/17 0900) Resp:  [15-28] 21 (06/17 0900) BP: (99-183)/(56-134) 163/87 mmHg (06/17 0900) SpO2:  [88 %-100 %] 98 % (06/17 0900) FiO2 (%):  [30 %] 30 % (06/16 1214) Room air  INTAKE / OUTPUT: Intake/Output     06/16 0701 - 06/17 0700 06/17 0701 - 06/18 0700   P.O. 780    I.V. (mL/kg) 739.3 (6.4)    NG/GT 30    IV Piggyback 385 100   Total Intake(mL/kg) 1934.3 (16.8) 100 (0.9)   Urine (mL/kg/hr) 5650 (2) 400 (1.2)   Emesis/NG output 500 (0.2)    Total Output 6150 400   Net -4215.7 -300        Stool Occurrence 3 x      PHYSICAL EXAMINATION: General: No distress Neuro:  Awake, follows commands HEENT:  Mm pink/moist Cardiovascular: s1s2 rrr, no m/r/g Lungs: intermittent hiccups, no wheeze Abdomen: soft, non tender Musculoskeletal:  No acute deformities Skin:  Pink, warm, intact  LABS:   Recent Labs Lab 03/12/13 1003 03/12/13 1020 03/13/13 0542 03/14/13 0405  HGB 15.6 16.3 12.4* 12.6*  HCT 44.1 48.0 36.0* 36.1*  WBC 7.9  --  6.4 7.1  PLT 231  --  140* 118*    Recent Labs Lab  03/12/13 1003 03/12/13 1020 03/12/13 1110 03/13/13 0542 03/14/13 0405 03/14/13 1605 03/15/13 0358  NA 141 138  --  141 141 138 138  K 3.0* 3.1*  --  3.3* 2.9* 2.8* 2.9*  CL 95* 99  --  103 101 97 97  CO2 20  --   --  25 29 26 31   GLUCOSE 182* 189*  --  169* 143* 282* 181*  BUN 23 22  --  22 18 16 9   CREATININE 1.48* 2.00*  --  1.28 1.20 1.19 0.96  CALCIUM 8.1*  --   --  7.6* 8.3* 8.2* 8.4  MG  --   --  2.3 1.9 1.8 1.5 2.5  PHOS  --   --  4.5 2.6 1.0* 1.0* 2.4   Imaging: Dg Chest Port 1 View  03/14/2013   *RADIOLOGY REPORT*  Clinical Data: Check endotracheal tube  PORTABLE CHEST - 1 VIEW  Comparison: 03/12/13  Findings: Endotracheal tube, endotracheal tube and right central venous line are unchanged.  Interval placement of an NG tube with tip in the stomach.  Normal cardiac silhouette. Local lung volumes. No pulmonary edema.  No pneumothorax.  IMPRESSION:  1.  Stable support apparatus with placement of NG tube. 2.  Low lung volumes. 3.  No interval change   Original Report Authenticated By:  Genevive Bi, M.D.    ASSESSMENT / PLAN:  PULMONARY A:  Acute respiratory failure - in the setting of AMS r/t alcohol intoxication, and possible aspiration. P:   -oxygen as needed to keep SpO2 > 92%  CARDIOVASCULAR A:  Hypertension - hx of HTN, pt reports he uses lisinopril, HCTZ at home. Tachycardia  P:  -add lopressor -get medical records from [place] in [place]  RENAL A: Hypokalemia. P:   -use oral route for replacement -f/u BMET  GASTROINTESTINAL A: Elevated LFT's likely from ETOH. Hx of gastric bypass. P:   -carb modified diet -protonix for SUP -continue thiamine, folic acid with hx of ETOH  HEMATOLOGIC A: Anemia, thrombocytopenia of chronic disease. P:  -Follow CBC   INFECTIOUS A:  Possible aspiration. P:   -change to augmentin bid on 6/17 and continue for addition 8 doses  ENDOCRINE A: Hyperglycemia >> uncertain hx of DM, but glucotrol and metformin listed as  outpt meds. P:   -f/u HbA1C -SSI -resume glucotrol, metformin  NEUROLOGIC A: Acute encephalopathy >> from ETOH; improved. Deconditioning. P:   -prn Ativan for CIWA > 8 -PT/OT evaluation  Summary: Transfer to tele, await PT/OT input, f/u BMET.  Will ask social worker to assess home situation.  Will try to get records from [place].  Keep on PCCM service since likely to d/c home in next 24 hours.  Coralyn Helling, MD [facility] Pulmonary/Critical Care 03/15/2013, 10:07 AM Pager:  [phone] After 3pm call: [phone]     D/w DM coordinator.  With hx of ETOH and lactic acidosis on admission >> will d/c metformin.  Coralyn Helling, MD [facility] Pulmonary/Critical Care 03/15/2013, 1:58 PM Pager:  [phone] After 3pm call: [phone]

## 2013-03-15 NOTE — Progress Notes (Signed)
NUTRITION FOLLOW UP  Intervention:   1. Glucerna shakes BID (8 oz provides 220 kcal, 10 g protein).  2. Patient encouraged to order easy to tolerate/digest foods from menu to increase intake.   Nutrition Dx:   Inadequate oral intake related to fair appetite as evidenced by 50% meal intake, ongoing  Goal:   Patient will meet >/=90% of estimated nutrition needs, not meeting.   Monitor:   PO intake, weight, labs  Assessment:   Patient extubated 6/16. Diet advanced to Carb Modified. Patient is eating approximately 50% of meals, but intake is limited due to concern about loose BM. Appetite is fair.   Height: Ht Readings from Last 1 Encounters:  03/15/13 5\' 11"  (1.803 m)    Weight Status:   Wt Readings from Last 1 Encounters:  03/15/13 240 lb 8.4 oz (109.1 kg)    Re-estimated needs:  Kcal: 2200-2350 kcal Protein: 130-140 g Fluid: 2.2-2.4 L  Skin: Intact  Diet Order: Carb Control   Intake/Output Summary (Last 24 hours) at 03/15/13 1441 Last data filed at 03/15/13 1300  Gross per 24 hour  Intake 1912.2 ml  Output   6230 ml  Net -4317.8 ml    Last BM: 3x 6/17   Labs:   Recent Labs Lab 03/14/13 0405 03/14/13 1605 03/15/13 0358  NA 141 138 138  K 2.9* 2.8* 2.9*  CL 101 97 97  CO2 29 26 31   BUN 18 16 9   CREATININE 1.20 1.19 0.96  CALCIUM 8.3* 8.2* 8.4  MG 1.8 1.5 2.5  PHOS 1.0* 1.0* 2.4  GLUCOSE 143* 282* 181*    CBG (last 3)   Recent Labs  03/15/13 0354 03/15/13 0720 03/15/13 1235  GLUCAP 184* 164* 228*    Scheduled Meds: . amoxicillin-clavulanate  1 tablet Oral Q12H  . folic acid  1 mg Oral Daily  . glipiZIDE  15 mg Oral BID AC  . insulin aspart  0-15 Units Subcutaneous TID WC  . metoprolol tartrate  25 mg Oral BID  . multivitamin with minerals  1 tablet Oral Daily  . pantoprazole  40 mg Oral Q1200  . thiamine  100 mg Oral Daily    Continuous Infusions:   Linnell Fulling, RD, LDN Pager #: [phone] After-Hours Pager #: [phone]

## 2013-03-15 NOTE — Progress Notes (Signed)
eLink Nursing ICU Electrolyte Replacement Protocol  Patient Name: Angel Phillips DOB: 26-Jan-1961 MRN: [id number]  Date of Service  03/15/2013 K+ 2.9 Replaced per protocol.  MD notified   HPI/Events of Note    Recent Labs Lab 03/12/13 1003 03/12/13 1020 03/12/13 1110 03/13/13 0542 03/14/13 0405 03/14/13 1605 03/15/13 0358  NA 141 138  --  141 141 138 138  K 3.0* 3.1*  --  3.3* 2.9* 2.8* 2.9*  CL 95* 99  --  103 101 97 97  CO2 20  --   --  25 29 26 31   GLUCOSE 182* 189*  --  169* 143* 282* 181*  BUN 23 22  --  22 18 16 9   CREATININE 1.48* 2.00*  --  1.28 1.20 1.19 0.96  CALCIUM 8.1*  --   --  7.6* 8.3* 8.2* 8.4  MG  --   --  2.3 1.9 1.8 1.5 2.5  PHOS  --   --  4.5 2.6 1.0* 1.0* 2.4    Estimated Creatinine Clearance: 116.6 ml/min (by C-G formula based on Cr of 0.96).  Intake/Output     06/16 0701 - 06/17 0700   P.O. 780   I.V. (mL/kg) 659.3 (5.7)   NG/GT 30   IV Piggyback 335   Total Intake(mL/kg) 1804.3 (15.7)   Urine (mL/kg/hr) 2750 (1)   Emesis/NG output 500 (0.2)   Total Output 3250   Net -1445.7       Stool Occurrence 3 x    - I/O DETAILED x24h    Total I/O In: 312.2 [I.V.:262.2; IV Piggyback:50] Out: 1750 [Urine:1750] - I/O THIS SHIFT    ASSESSMENT   eICURN Interventions     ASSESSMENT: MAJOR ELECTROLYTE    Merita Norton 03/15/2013, 4:58 AM

## 2013-03-15 NOTE — Progress Notes (Signed)
Patient discharged to unit 4700 complete report given to [facility], pt transported to room 4733. Patient placed on monitor per centralized monitoring. Emelda Fear

## 2013-03-16 DIAGNOSIS — N179 Acute kidney failure, unspecified: Secondary | ICD-10-CM | POA: Diagnosis not present

## 2013-03-16 DIAGNOSIS — A419 Sepsis, unspecified organism: Secondary | ICD-10-CM

## 2013-03-16 DIAGNOSIS — G9340 Encephalopathy, unspecified: Secondary | ICD-10-CM | POA: Diagnosis not present

## 2013-03-16 DIAGNOSIS — F329 Major depressive disorder, single episode, unspecified: Secondary | ICD-10-CM | POA: Diagnosis not present

## 2013-03-16 DIAGNOSIS — F1010 Alcohol abuse, uncomplicated: Secondary | ICD-10-CM | POA: Diagnosis present

## 2013-03-16 DIAGNOSIS — J9600 Acute respiratory failure, unspecified whether with hypoxia or hypercapnia: Secondary | ICD-10-CM | POA: Diagnosis not present

## 2013-03-16 LAB — GLUCOSE, CAPILLARY
Glucose-Capillary: 166 mg/dL — ABNORMAL HIGH (ref 70–99)
Glucose-Capillary: 216 mg/dL — ABNORMAL HIGH (ref 70–99)

## 2013-03-16 LAB — BASIC METABOLIC PANEL
CO2: 28 mEq/L (ref 19–32)
Calcium: 8.7 mg/dL (ref 8.4–10.5)
Creatinine, Ser: 0.99 mg/dL (ref 0.50–1.35)

## 2013-03-16 MED ORDER — POTASSIUM CHLORIDE CRYS ER 20 MEQ PO TBCR
40.0000 meq | EXTENDED_RELEASE_TABLET | Freq: Once | ORAL | Status: AC
  Administered 2013-03-16: 40 meq via ORAL
  Filled 2013-03-16: qty 2

## 2013-03-16 MED ORDER — AMLODIPINE BESYLATE 10 MG PO TABS
10.0000 mg | ORAL_TABLET | Freq: Every day | ORAL | Status: DC
  Administered 2013-03-16 – 2013-03-17 (×2): 10 mg via ORAL
  Filled 2013-03-16 (×2): qty 1

## 2013-03-16 MED ORDER — POTASSIUM CHLORIDE CRYS ER 20 MEQ PO TBCR: 40.0000 meq | EXTENDED_RELEASE_TABLET | Freq: Once | ORAL | Status: DC

## 2013-03-16 MED ORDER — GLIPIZIDE 5 MG PO TABS
15.0000 mg | ORAL_TABLET | Freq: Two times a day (BID) | ORAL | Status: DC
  Administered 2013-03-16 – 2013-03-17 (×2): 15 mg via ORAL
  Filled 2013-03-16 (×4): qty 1

## 2013-03-16 MED ORDER — GLIPIZIDE 5 MG PO TABS: 5.0000 mg | ORAL_TABLET | Freq: Every day | ORAL | Status: DC

## 2013-03-16 MED ORDER — ZOLPIDEM TARTRATE 5 MG PO TABS
5.0000 mg | ORAL_TABLET | Freq: Once | ORAL | Status: AC
  Administered 2013-03-16: 5 mg via ORAL
  Filled 2013-03-16: qty 1

## 2013-03-16 MED ORDER — LISINOPRIL 10 MG PO TABS
10.0000 mg | ORAL_TABLET | Freq: Every day | ORAL | Status: DC
  Administered 2013-03-16 – 2013-03-17 (×2): 10 mg via ORAL
  Filled 2013-03-16 (×2): qty 1

## 2013-03-16 NOTE — Progress Notes (Addendum)
PULMONARY  / CRITICAL CARE MEDICINE  Name: Angel Phillips MRN: [id number] DOB: [birth year]    ADMISSION DATE:  03/12/2013 CONSULTATION DATE:  03/12/13  REFERRING MD :  EDP PRIMARY SERVICE: CCM  CHIEF COMPLAINT:  AMS  BRIEF PATIENT DESCRIPTION: 52 year old male, binge drinker, admitted with AMS & intubated in ED due to altering agitation / somnolence.   SIGNIFICANT EVENTS:  6/14 - Admit with AMS, intubated  6/16 - extubated  STUDIES: 6/14 Head CT / Neck>>>no cervical abnormality, no acute intracranial findings  LINES / TUBES: ETT 6/14 >>>6/16 R IJ TLC 6/14>>>6/17  CULTURES: Blood x2 6/14 >>> Urine 6/14 >>> negative  ANTIBIOTICS: Unasyn 6/14 >>>6/17 Augmentin 6/17 >>>   SUBJECTIVE:  Feels better.  Says he took gallon of gin because of stress and depression Drinks about 1/5th of gin per week and 3 beers per day Stressed over school.  VITAL SIGNS: Temp:  [98.7 F (37.1 C)-100 F (37.8 C)] 100 F (37.8 C) (06/18 0706) Pulse Rate:  [83-97] 87 (06/18 0706) Resp:  [18-24] 18 (06/18 0706) BP: (150-172)/(91-106) 150/106 mmHg (06/18 0706) SpO2:  [96 %-98 %] 98 % (06/18 0706) Weight:  [106.55 kg (234 lb 14.4 oz)-109.1 kg (240 lb 8.4 oz)] 106.55 kg (234 lb 14.4 oz) (06/18 0706) Room air  INTAKE / OUTPUT: Intake/Output     06/17 0701 - 06/18 0700 06/18 0701 - 06/19 0700   P.O. 960 240   I.V. (mL/kg) 10 (0.1)    NG/GT     IV Piggyback 150    Total Intake(mL/kg) 1120 (10.3) 240 (2.3)   Urine (mL/kg/hr) 4625 (1.8) 250 (0.7)   Emesis/NG output     Total Output 4625 250   Net -3505 -10        Urine Occurrence 1 x    Stool Occurrence 1 x      PHYSICAL EXAMINATION: General: No distress Neuro:  Awake, follows commands HEENT:  Mm pink/moist Cardiovascular: s1s2 rrr, no m/r/g Lungs:  no wheeze Abdomen: soft, non tender Musculoskeletal:  No acute deformities Skin:  Pink, warm, intact  LABS:   Recent Labs Lab 03/12/13 1003 03/12/13 1020 03/13/13 0542  03/14/13 0405  HGB 15.6 16.3 12.4* 12.6*  HCT 44.1 48.0 36.0* 36.1*  WBC 7.9  --  6.4 7.1  PLT 231  --  140* 118*    Recent Labs Lab 03/12/13 1110 03/13/13 0542 03/14/13 0405 03/14/13 1605 03/15/13 0358 03/15/13 1436 03/16/13 0448  NA  --  141 141 138 138 134* 136  K  --  3.3* 2.9* 2.8* 2.9* 3.9 3.2*  CL  --  103 101 97 97 95* 97  CO2  --  25 29 26 31 26 28   GLUCOSE  --  169* 143* 282* 181* 213* 130*  BUN  --  22 18 16 9 9 9   CREATININE  --  1.28 1.20 1.19 0.96 0.89 0.99  CALCIUM  --  7.6* 8.3* 8.2* 8.4 9.0 8.7  MG 2.3 1.9 1.8 1.5 2.5  --   --   PHOS 4.5 2.6 1.0* 1.0* 2.4  --   --    Imaging: No results found.  ASSESSMENT / PLAN: Principal Problem:   Acute respiratory failure Active Problems:   Acute encephalopathy   Metabolic acidosis   Lactic acid acidosis   Acute renal failure (ARF)   Sleep apnea (presumed)   Hyperglycemia   ETOH abuse   Depression, major   PULMONARY A:  Acute respiratory failure - in the setting  of AMS r/t alcohol intoxication, and possible aspiration. REsolved P:   -oxygen as needed to keep SpO2 > 92%  CARDIOVASCULAR A:  Hypertension - hx of HTN, pt reports he uses lisinopril, HCTZ at home. Tachycardia  P:  -start lisinopril/amlodipine   RENAL A: Hypokalemia. P:   Replete further  -f/u BMET  GASTROINTESTINAL A: Elevated LFT's likely from ETOH. Hx of gastric bypass. P:   -carb modified diet -protonix for SUP -continue thiamine, folic acid with hx of ETOH  HEMATOLOGIC A: Anemia, thrombocytopenia of chronic disease. P:  -Follow CBC   INFECTIOUS A:  Possible aspiration. P:   -changed to augmentin bid on 6/17 and continue for addition 6 doses  ENDOCRINE A: Hyperglycemia >> uncertain hx of DM, but glucotrol and metformin listed as outpt meds. P:   -f/u HbA1C -SSI -resume glucotrol, NEUROLOGIC A: Acute encephalopathy >> from ETOH; improved. Deconditioning. Depression Partial suicide attempt P:   -prn Ativan  for CIWA > 8 -PT/OT evaluation -Psych eval prior to d/c   Summary: EtOH toxicity, AMS VDRF now resolved. Depression/ETOH stress over school Needs psych eval prior to d/c  Adjust bp meds. Replace K Poss d/c 6/19   Dorcas Carrow Beeper  [phone]  Cell  [phone]  If no response or cell goes to voicemail, call beeper [phone]  [facility] 03/16/2013, 10:36 AM

## 2013-03-16 NOTE — Progress Notes (Signed)
Pt. Alert and oriented this am. No s/s of distress or discomfort noted. Pt. Denies pain. CIWA this am 0. Pt. Remains incontinent of b/b. RN will continue to monitor pt. For changes in condition. Michalene Debruler, Cheryll Dessert

## 2013-03-16 NOTE — Consult Note (Signed)
Reason for Consult: suicidal attempt Referring Physician: Dr. Modesto Charon Biven is an 52 y.o. male.  HPI: Patient was seen and chart reviewed. Patient was known to this provider from his previous the hospital encounter is including in [facility] hospital. Patient stated he was admitted to the [facility] after he was drunk and fell and hit his head. Reportedly patient has altered mental status and unable to provide information at the time. Patient has severe alcohol withdrawal symptoms which was controlled well without any current symptoms. Patient minimizes his alcohol is some and stated he needs to go to work and not interested in hospitalization are rehabilitation services. Patient is interested in attending Alcoholics Anonymous and getting a sponsor if not have one.  Patient denies/minimizes his current alcohol and also denies symptoms of depression anxiety or psychosis. Patient contracts for safety at this time.   Mental Status Examination: Patient appeared as per his stated age, sitting on a chair with a normal psychomotor activity and maintaining good eye contact. Patient has fine mood and his affect was appropriate and bright. He has normal rate, rhythm, and volume of speech. His thought process is linear and goal directed. Patient has denied suicidal, homicidal ideations, intentions or plans. Patient has no evidence of auditory or visual hallucinations, delusions, and paranoia. Patient has fair insight judgment and impulse control.  Past Medical History  Diagnosis Date  . Hypertension   . Diabetes mellitus without complication     History reviewed. No pertinent past surgical history.  History reviewed. No pertinent family history.  Social History:  reports that  drinks alcohol. His tobacco and drug histories are not on file.  Allergies: No Known Allergies  Medications: I have reviewed the patient's current medications.  Results for orders placed during the hospital encounter  of 03/12/13 (from the past 48 hour(s))  BASIC METABOLIC PANEL     Status: Abnormal   Collection Time    03/14/13  4:05 PM      Result Value Range   Sodium 138  135 - 145 mEq/L   Potassium 2.8 (*) 3.5 - 5.1 mEq/L   Chloride 97  96 - 112 mEq/L   CO2 26  19 - 32 mEq/L   Glucose, Bld 282 (*) 70 - 99 mg/dL   BUN 16  6 - 23 mg/dL   Creatinine, Ser 2.95  0.50 - 1.35 mg/dL   Calcium 8.2 (*) 8.4 - 10.5 mg/dL   GFR calc non Af Amer 68 (*) >90 mL/min   GFR calc Af Amer 79 (*) >90 mL/min   Comment:            The eGFR has been calculated     using the CKD EPI equation.     This calculation has not been     validated in all clinical     situations.     eGFR's persistently     <90 mL/min signify     possible Chronic Kidney Disease.  MAGNESIUM     Status: None   Collection Time    03/14/13  4:05 PM      Result Value Range   Magnesium 1.5  1.5 - 2.5 mg/dL  PHOSPHORUS     Status: Abnormal   Collection Time    03/14/13  4:05 PM      Result Value Range   Phosphorus 1.0 (*) 2.3 - 4.6 mg/dL   Comment: CRITICAL RESULT CALLED TO, READ BACK BY AND VERIFIED WITH:  T CRITE,RN 1712 03/14/13 WBOND  HEMOGLOBIN A1C     Status: Abnormal   Collection Time    03/14/13  4:05 PM      Result Value Range   Hemoglobin A1C 7.8 (*) <5.7 %   Comment: (NOTE)                                                                               According to the ADA Clinical Practice Recommendations for 2011, when     HbA1c is used as a screening test:      >=6.5%   Diagnostic of Diabetes Mellitus               (if abnormal result is confirmed)     5.7-6.4%   Increased risk of developing Diabetes Mellitus     References:Diagnosis and Classification of Diabetes Mellitus,Diabetes     Care,2011,34(Suppl 1):S62-S69 and Standards of Medical Care in             Diabetes - 2011,Diabetes Care,2011,34 (Suppl 1):S11-S61.   Mean Plasma Glucose 177 (*) <117 mg/dL  GLUCOSE, CAPILLARY     Status: Abnormal   Collection Time     03/14/13  8:37 PM      Result Value Range   Glucose-Capillary 222 (*) 70 - 99 mg/dL  GLUCOSE, CAPILLARY     Status: Abnormal   Collection Time    03/15/13 12:38 AM      Result Value Range   Glucose-Capillary 212 (*) 70 - 99 mg/dL   Comment 1 Documented in Chart     Comment 2 Notify RN    GLUCOSE, CAPILLARY     Status: Abnormal   Collection Time    03/15/13  3:54 AM      Result Value Range   Glucose-Capillary 184 (*) 70 - 99 mg/dL   Comment 1 Documented in Chart     Comment 2 Notify RN    BASIC METABOLIC PANEL     Status: Abnormal   Collection Time    03/15/13  3:58 AM      Result Value Range   Sodium 138  135 - 145 mEq/L   Potassium 2.9 (*) 3.5 - 5.1 mEq/L   Chloride 97  96 - 112 mEq/L   CO2 31  19 - 32 mEq/L   Glucose, Bld 181 (*) 70 - 99 mg/dL   BUN 9  6 - 23 mg/dL   Creatinine, Ser 1.61  0.50 - 1.35 mg/dL   Calcium 8.4  8.4 - 09.6 mg/dL   GFR calc non Af Amer >90  >90 mL/min   GFR calc Af Amer >90  >90 mL/min   Comment:            The eGFR has been calculated     using the CKD EPI equation.     This calculation has not been     validated in all clinical     situations.     eGFR's persistently     <90 mL/min signify     possible Chronic Kidney Disease.  MAGNESIUM     Status: None   Collection Time    03/15/13  3:58 AM  Result Value Range   Magnesium 2.5  1.5 - 2.5 mg/dL  PHOSPHORUS     Status: None   Collection Time    03/15/13  3:58 AM      Result Value Range   Phosphorus 2.4  2.3 - 4.6 mg/dL  GLUCOSE, CAPILLARY     Status: Abnormal   Collection Time    03/15/13  7:20 AM      Result Value Range   Glucose-Capillary 164 (*) 70 - 99 mg/dL  GLUCOSE, CAPILLARY     Status: Abnormal   Collection Time    03/15/13 12:35 PM      Result Value Range   Glucose-Capillary 228 (*) 70 - 99 mg/dL   Comment 1 Notify RN    BASIC METABOLIC PANEL     Status: Abnormal   Collection Time    03/15/13  2:36 PM      Result Value Range   Sodium 134 (*) 135 - 145 mEq/L    Potassium 3.9  3.5 - 5.1 mEq/L   Comment: HEMOLYSIS AT THIS LEVEL MAY AFFECT RESULT     DELTA CHECK NOTED   Chloride 95 (*) 96 - 112 mEq/L   CO2 26  19 - 32 mEq/L   Glucose, Bld 213 (*) 70 - 99 mg/dL   BUN 9  6 - 23 mg/dL   Creatinine, Ser 1.61  0.50 - 1.35 mg/dL   Calcium 9.0  8.4 - 09.6 mg/dL   GFR calc non Af Amer >90  >90 mL/min   GFR calc Af Amer >90  >90 mL/min   Comment:            The eGFR has been calculated     using the CKD EPI equation.     This calculation has not been     validated in all clinical     situations.     eGFR's persistently     <90 mL/min signify     possible Chronic Kidney Disease.  GLUCOSE, CAPILLARY     Status: Abnormal   Collection Time    03/15/13  4:19 PM      Result Value Range   Glucose-Capillary 152 (*) 70 - 99 mg/dL   Comment 1 Notify RN    GLUCOSE, CAPILLARY     Status: Abnormal   Collection Time    03/15/13  9:27 PM      Result Value Range   Glucose-Capillary 112 (*) 70 - 99 mg/dL   Comment 1 Notify RN     Comment 2 Documented in Chart    BASIC METABOLIC PANEL     Status: Abnormal   Collection Time    03/16/13  4:48 AM      Result Value Range   Sodium 136  135 - 145 mEq/L   Potassium 3.2 (*) 3.5 - 5.1 mEq/L   Chloride 97  96 - 112 mEq/L   CO2 28  19 - 32 mEq/L   Glucose, Bld 130 (*) 70 - 99 mg/dL   BUN 9  6 - 23 mg/dL   Creatinine, Ser 0.45  0.50 - 1.35 mg/dL   Calcium 8.7  8.4 - 40.9 mg/dL   GFR calc non Af Amer >90  >90 mL/min   GFR calc Af Amer >90  >90 mL/min   Comment:            The eGFR has been calculated     using the CKD EPI equation.     This calculation has  not been     validated in all clinical     situations.     eGFR's persistently     <90 mL/min signify     possible Chronic Kidney Disease.  GLUCOSE, CAPILLARY     Status: Abnormal   Collection Time    03/16/13  6:28 AM      Result Value Range   Glucose-Capillary 155 (*) 70 - 99 mg/dL   Comment 1 Notify RN    GLUCOSE, CAPILLARY     Status: Abnormal    Collection Time    03/16/13 11:31 AM      Result Value Range   Glucose-Capillary 166 (*) 70 - 99 mg/dL    No results found.  Positive for bad mood, excessive alcohol consumption and sleep disturbance Blood pressure 130/79, pulse 80, temperature 98.9 F (37.2 C), temperature source Oral, resp. rate 20, height 5\' 11"  (1.803 m), weight 106.55 kg (234 lb 14.4 oz), SpO2 98.00%.   Assessment/Plan: Alcohol dependence Substance induced mood disorder  Recommendation:  1. Patient does not meet criteria for acute psych admission and has no safety concerns 2. Referred to Residential substance abuse treatment or CDIOP 3. Patient prefer to be CDIOP due to pending school work  4. Patient stated that his son from [place]  Is planning to relocate to help him 5. Patient has [facility] benefits if needed. He is ex marine.  6. Recommend no medications at this time 7. Appreciate psych consult and follow up as needed  Keniyah Gelinas,JANARDHAHA R. 03/16/2013, 3:57 PM

## 2013-03-16 NOTE — Evaluation (Signed)
Occupational Therapy Evaluation Patient Details Name: Angel Phillips MRN: [id number] DOB: 15-Sep-1961 Today's Date: 03/16/2013 Time: 1191-4782 OT Time Calculation (min): 18 min  OT Assessment / Plan / Recommendation Clinical Impression  Pt admitted with encephalopathy due to alcohol consumption.  Pt presents with decreased awareness of deficits and safety, decreased activity tolerance, and impaired balance interfering with mobility and ADL.  Will follow acutely.  Pt states his son will be moving in with him in July.  Will need 24 hour care upon d/c, recommending ST rehab in SNF.    OT Assessment  Patient needs continued OT Services    Follow Up Recommendations  SNF;Supervision/Assistance - 24 hour    Barriers to Discharge Decreased caregiver support    Equipment Recommendations  3 in 1 bedside comode    Recommendations for Other Services    Frequency  Min 2X/week    Precautions / Restrictions Precautions Precautions: Fall Precaution Comments: pt's girlfriend states pt does not use his leg braces   Pertinent Vitals/Pain No pain, on RA    ADL  Eating/Feeding: Independent Where Assessed - Eating/Feeding: Chair Grooming: Wash/dry hands;Wash/dry face;Supervision/safety Where Assessed - Grooming: Unsupported sitting Upper Body Bathing: Supervision/safety Where Assessed - Upper Body Bathing: Unsupported sitting Lower Body Bathing: Moderate assistance Where Assessed - Lower Body Bathing: Unsupported sitting;Supported sit to stand Upper Body Dressing: Supervision/safety Where Assessed - Upper Body Dressing: Unsupported sitting Lower Body Dressing: Moderate assistance Where Assessed - Lower Body Dressing: Unsupported sitting;Supported sit to stand Toilet Transfer: Minimal assistance Toilet Transfer Method: Sit to stand Toilet Transfer Equipment: Bedside commode Toileting - Clothing Manipulation and Hygiene: Minimal assistance Where Assessed - Toileting Clothing Manipulation and  Hygiene: Sit to stand from 3-in-1 or toilet Equipment Used: Gait belt;Rolling walker Transfers/Ambulation Related to ADLs: min assist with bed elevated, verbal cues for walker use and hand placement ADL Comments: Pt able donn L sock by crossing foot over opposite knee, leans forward to donn R sock and became SOB requiring help.  Can momentarily stand with one hand on walker for pericare.    OT Diagnosis: Generalized weakness;Cognitive deficits  OT Problem List: Decreased strength;Decreased activity tolerance;Impaired balance (sitting and/or standing);Decreased cognition;Decreased safety awareness;Decreased knowledge of use of DME or AE;Cardiopulmonary status limiting activity;Obesity OT Treatment Interventions: Self-care/ADL training;DME and/or AE instruction;Cognitive remediation/compensation;Patient/family education;Balance training;Therapeutic activities   OT Goals Acute Rehab OT Goals OT Goal Formulation: With patient Time For Goal Achievement: 03/23/13 Potential to Achieve Goals: Good ADL Goals Pt Will Perform Grooming: with supervision;Standing at sink (2 activities) ADL Goal: Grooming - Progress: Goal set today Pt Will Perform Lower Body Bathing: with supervision;Sitting, edge of bed;Sit to stand from bed;with adaptive equipment ADL Goal: Lower Body Bathing - Progress: Goal set today Pt Will Perform Lower Body Dressing: with supervision;Sit to stand from bed;with adaptive equipment (sock aid) ADL Goal: Lower Body Dressing - Progress: Goal set today Pt Will Transfer to Toilet: with supervision;Ambulation;3-in-1 (over toilet) ADL Goal: Toilet Transfer - Progress: Goal set today Pt Will Perform Toileting - Clothing Manipulation: with supervision;Standing ADL Goal: Toileting - Clothing Manipulation - Progress: Goal set today Pt Will Perform Toileting - Hygiene: with supervision;Sit to stand from 3-in-1/toilet ADL Goal: Toileting - Hygiene - Progress: Goal set today  Visit Information   Last OT Received On: 03/16/13 Assistance Needed: +1    Subjective Data  Subjective: "I'm in here because I drank too much.  I am failing grad school."   Prior Functioning     Home Living Lives With: Alone Type  of Home: Apartment Home Access: Stairs to enter [employer] of Steps: flight Entrance Stairs-Rails: Right;Left;Can reach both Home Layout: One level Bathroom Shower/Tub: Engineer, manufacturing systems: Standard Home Adaptive Equipment: Shower chair with back (leg braces) Prior Function Level of Independence: Independent Able to Take Stairs?: Yes Driving: Yes Vocation: On disability Communication Communication: No difficulties Dominant Hand: Left         Vision/Perception Vision - History Baseline Vision: Wears glasses only for reading Patient Visual Report: No change from baseline   Cognition  Cognition Arousal/Alertness: Awake/alert Behavior During Therapy: WFL for tasks assessed/performed Overall Cognitive Status: Impaired/Different from baseline Area of Impairment: Safety/judgement;Memory;Following commands Memory: Decreased recall of precautions Following Commands: Follows one step commands consistently Safety/Judgement: Decreased awareness of safety;Decreased awareness of deficits    Extremity/Trunk Assessment Right Upper Extremity Assessment RUE ROM/Strength/Tone: WFL for tasks assessed Left Upper Extremity Assessment LUE ROM/Strength/Tone: WFL for tasks assessed Trunk Assessment Trunk Assessment: Normal     Mobility Bed Mobility Bed Mobility: Not assessed (pt seated at EOB upon OTs arrival) Transfers Transfers: Sit to Stand;Stand to Sit Sit to Stand: 4: Min assist;With upper extremity assist;From bed;From elevated surface Stand to Sit: 4: Min assist;With upper extremity assist;To chair/3-in-1     Exercise     Balance Balance Balance Assessed: Yes Static Sitting Balance Static Sitting - Balance Support: Feet  supported Static Sitting - Level of Assistance: 6: Modified independent (Device/Increase time) Dynamic Sitting Balance Dynamic Sitting - Balance Support: Feet supported;No upper extremity supported;During functional activity Dynamic Sitting - Level of Assistance: 5: Stand by assistance Dynamic Sitting - Balance Activities: Forward lean/weight shifting (donning socks) Static Standing Balance Static Standing - Balance Support: Right upper extremity supported;During functional activity Static Standing - Level of Assistance: 4: Min assist Static Standing - Comment/# of Minutes: 1   End of Session OT - End of Session Equipment Utilized During Treatment: Gait belt Activity Tolerance: Patient limited by fatigue Patient left: in chair;with call bell/phone within reach Nurse Communication: Mobility status  GO     Evern Bio 03/16/2013, 10:12 AM [phone]

## 2013-03-17 ENCOUNTER — Encounter (HOSPITAL_COMMUNITY): Payer: Self-pay | Admitting: General Practice

## 2013-03-17 LAB — BASIC METABOLIC PANEL
BUN: 13 mg/dL (ref 6–23)
Chloride: 100 mEq/L (ref 96–112)
GFR calc Af Amer: >90 mL/min (ref >90)
Potassium: 3.3 mEq/L — ABNORMAL LOW (ref 3.5–5.1)

## 2013-03-17 LAB — GLUCOSE, CAPILLARY
Glucose-Capillary: 116 mg/dL — ABNORMAL HIGH (ref 70–99)
Glucose-Capillary: 183 mg/dL — ABNORMAL HIGH (ref 70–99)

## 2013-03-17 MED ORDER — POTASSIUM CHLORIDE CRYS ER 20 MEQ PO TBCR
40.0000 meq | EXTENDED_RELEASE_TABLET | Freq: Once | ORAL | Status: AC
  Administered 2013-03-17: 40 meq via ORAL
  Filled 2013-03-17: qty 2

## 2013-03-17 MED ORDER — LISINOPRIL 20 MG PO TABS: 10.0000 mg | ORAL_TABLET | Freq: Every day | ORAL | Status: DC

## 2013-03-17 NOTE — Progress Notes (Signed)
Patient was referred to CSW for SNF placement vs home health- 24 hour supervision.  Patient was found by family in poor condition and has current history of alcohol abuse.  Patient was assessed by Psychiatrist and defers any residential treatment. Per MD notes- patient's son is moving in with patient to help him. This CSW was unable to assess patient prior to his d/c home today.  Lorri Frederick. [place] Pugh  [phone]

## 2013-03-17 NOTE — Discharge Summary (Signed)
Physician Discharge Summary  Patient ID: Sahith Nurse MRN: [id number] DOB/AGE: 12/20/1960 52 y.o.  Admit date: 03/12/2013 Discharge date: 03/17/2013  Problem List Principal Problem:   Acute respiratory failure Active Problems:   Acute encephalopathy   Metabolic acidosis   Lactic acid acidosis   Acute renal failure (ARF)   Sleep apnea (presumed)   Hyperglycemia   ETOH abuse   Depression, major  HPI: Mr. Council is a 52 year old male with a history of alcohol binges but an unclear history of chronic alcohol use. His family noted that it had been at least 4 days since they had been able to contact him and called [facility] PD to check on him on 6/14. Police broke into his home and found him minimally responsive in a chair, covered in urine, feces, and vomit. He was noted to have multiple empty alcohol containers surrounding him.  Upon arrival to the ED he was intermittently combative and minimally responsive. He was intubated for airway protection and for further workup and evaluation. They noted an increased lactate and he also has an AKI.  We are consulted for admission and management. 03/12/13     Hospital Course: SIGNIFICANT EVENTS:  6/14 - Admit with AMS, intubated  6/16 - extubated  STUDIES:  6/14 Head CT / Neck>>>no cervical abnormality, no acute intracranial findings  LINES / TUBES:  ETT 6/14 >>>6/16  R IJ TLC 6/14>>>6/17  CULTURES:  Blood x2 6/14 >>>  Urine 6/14 >>> negative  ANTIBIOTICS:  Unasyn 6/14 >>>6/17  Augmentin 6/17 >>> 6-19  ASSESSMENT / PLAN PULMONARY  A:  Acute respiratory failure - in the setting of AMS r/t alcohol intoxication, and possible aspiration. REsolved  P:  -oxygen as needed to keep SpO2 > 92%  CARDIOVASCULAR  A:  Hypertension - hx of HTN, pt reports he uses lisinopril, HCTZ at home.  Tachycardia  P:  -start lisinopril/amlodipine  RENAL  A: Hypokalemia.  P:  Replete further  -f/u BMET  GASTROINTESTINAL  A: Elevated LFT's likely  from ETOH.  Hx of gastric bypass.  P:  -carb modified diet  -protonix for SUP   HEMATOLOGIC  A: Anemia, thrombocytopenia of chronic disease.  P:  -Follow CBC  INFECTIOUS  A: Possible aspiration.  P:  -changed to augmentin bid on 6/17 and stop 6-19.  ENDOCRINE  A: Hyperglycemia >> uncertain hx of DM, but glucotrol and metformin listed as outpt meds.  P:  -f/u HbA1C  -SSI  -resume glucotrol,  NEUROLOGIC  A: Acute encephalopathy >> from ETOH; improved.  Deconditioning.  Depression  Partial suicide attempt  P:  -Psych eval done and not of danger. He is to follow up at [facility].      Labs at discharge Lab Results  Component Value Date   CREATININE 1.03 03/17/2013   BUN 13 03/17/2013   NA 138 03/17/2013   K 3.3* 03/17/2013   CL 100 03/17/2013   CO2 28 03/17/2013   Lab Results  Component Value Date   WBC 7.1 03/14/2013   HGB 12.6* 03/14/2013   HCT 36.1* 03/14/2013   MCV 84.9 03/14/2013   PLT 118* 03/14/2013   Lab Results  Component Value Date   ALT 37 03/14/2013   AST 148* 03/14/2013   ALKPHOS 119* 03/14/2013   BILITOT 1.1 03/14/2013   Lab Results  Component Value Date   INR 0.97 03/12/2013   INR 0.98 03/12/2013    Current radiology studies No results found.  Disposition:  Final discharge disposition not confirmed  Discharge Orders   Future Orders Complete By Expires     Discharge patient  As directed         Medication List    STOP taking these medications       acetaminophen 500 MG tablet  Commonly known as:  TYLENOL      TAKE these medications       amLODipine 10 MG tablet  Commonly known as:  NORVASC  Take 10 mg by mouth daily.     diphenhydrAMINE 50 MG tablet  Commonly known as:  BENADRYL  Take 50 mg by mouth at bedtime as needed for sleep.     glipiZIDE 5 MG tablet  Commonly known as:  GLUCOTROL  Take 15 mg by mouth 2 (two) times daily before a meal.     hydrochlorothiazide 12.5 MG tablet  Commonly known as:  HYDRODIURIL  Take 12.5 mg by  mouth daily.     lisinopril 20 MG tablet  Commonly known as:  PRINIVIL,ZESTRIL  Take 0.5 tablets (10 mg total) by mouth daily.     metFORMIN 1000 MG tablet  Commonly known as:  GLUCOPHAGE  Take 1,000 mg by mouth 2 (two) times daily with a meal.          Discharged Condition: good  Time spent on discharge greater than 40 minutes.  Vital signs at Discharge. Temp:  [98.7 F (37.1 C)-99.1 F (37.3 C)] 98.7 F (37.1 C) (06/19 0500) Pulse Rate:  [80-98] 98 (06/19 0500) Resp:  [18-20] 18 (06/19 0500) BP: (130-151)/(67-95) 132/74 mmHg (06/19 0500) SpO2:  [98 %-99 %] 98 % (06/19 0500) Weight:  [106.55 kg (234 lb 14.4 oz)] 106.55 kg (234 lb 14.4 oz) (06/19 0500) Office follow up Special Information or instructions. [facility].  Signed: Brett Canales Minor ACNP Adolph Pollack PCCM Pager [phone] till 3 pm If no answer page [phone] 03/17/2013, 9:44 AM   Pt seen and examined with S Minor and I agree with the above d/c plan Dorcas Carrow Beeper  [phone]  Cell  [phone]  If no response or cell goes to voicemail, call beeper [phone]

## 2013-03-17 NOTE — Progress Notes (Signed)
PT d/c home. D/c instructions and medications reviewed with Pt. PT states understanding. All Pt questions answered

## 2013-03-17 NOTE — Progress Notes (Signed)
Pt request insulin shot, 3u said he was going to eat before going home

## 2013-03-17 NOTE — Progress Notes (Signed)
UR chart review completed.  

## 2013-03-18 LAB — CULTURE, BLOOD (ROUTINE X 2)
Culture: NO GROWTH
Culture: NO GROWTH

## 2013-06-11 ENCOUNTER — Encounter (HOSPITAL_COMMUNITY): Payer: Self-pay | Admitting: *Deleted

## 2013-06-11 ENCOUNTER — Emergency Department (EMERGENCY_DEPARTMENT_HOSPITAL)
Admission: EM | Admit: 2013-06-11 | Discharge: 2013-06-11 | Disposition: A | Discharge status: Other Acute Inpatient Hospital | Payer: Medicare Other | Source: Home / Self Care | Attending: Emergency Medicine | Admitting: Emergency Medicine

## 2013-06-11 ENCOUNTER — Emergency Department (HOSPITAL_COMMUNITY): Payer: Medicare Other

## 2013-06-11 ENCOUNTER — Encounter (HOSPITAL_COMMUNITY): Payer: Self-pay

## 2013-06-11 ENCOUNTER — Inpatient Hospital Stay (HOSPITAL_COMMUNITY)
Admission: AD | Admit: 2013-06-11 | Discharge: 2013-06-15 | DRG: 897 | Disposition: A | Discharge status: Home / Self Care | Payer: Medicare Other | Source: Intra-hospital | Attending: Psychiatry | Admitting: Psychiatry

## 2013-06-11 DIAGNOSIS — Z79899 Other long term (current) drug therapy: Secondary | ICD-10-CM

## 2013-06-11 DIAGNOSIS — F10239 Alcohol dependence with withdrawal, unspecified: Principal | ICD-10-CM | POA: Diagnosis present

## 2013-06-11 DIAGNOSIS — F321 Major depressive disorder, single episode, moderate: Secondary | ICD-10-CM | POA: Diagnosis present

## 2013-06-11 DIAGNOSIS — I6350 Cerebral infarction due to unspecified occlusion or stenosis of unspecified cerebral artery: Secondary | ICD-10-CM | POA: Diagnosis not present

## 2013-06-11 DIAGNOSIS — N189 Chronic kidney disease, unspecified: Secondary | ICD-10-CM | POA: Diagnosis present

## 2013-06-11 DIAGNOSIS — F4329 Adjustment disorder with other symptoms: Secondary | ICD-10-CM | POA: Diagnosis present

## 2013-06-11 DIAGNOSIS — F1010 Alcohol abuse, uncomplicated: Secondary | ICD-10-CM

## 2013-06-11 DIAGNOSIS — F329 Major depressive disorder, single episode, unspecified: Secondary | ICD-10-CM

## 2013-06-11 DIAGNOSIS — E119 Type 2 diabetes mellitus without complications: Secondary | ICD-10-CM | POA: Diagnosis present

## 2013-06-11 DIAGNOSIS — F1020 Alcohol dependence, uncomplicated: Secondary | ICD-10-CM

## 2013-06-11 DIAGNOSIS — I129 Hypertensive chronic kidney disease with stage 1 through stage 4 chronic kidney disease, or unspecified chronic kidney disease: Secondary | ICD-10-CM | POA: Diagnosis present

## 2013-06-11 DIAGNOSIS — F10939 Alcohol use, unspecified with withdrawal, unspecified: Principal | ICD-10-CM | POA: Diagnosis present

## 2013-06-11 LAB — CBC WITH DIFFERENTIAL/PLATELET
Basophils Absolute: 0 10*3/uL (ref 0.0–0.1)
Eosinophils Absolute: 0 10*3/uL (ref 0.0–0.7)
Eosinophils Relative: 0 % (ref 0–5)
Lymphocytes Relative: 5 % — ABNORMAL LOW (ref 12–46)
MCV: 86 fL (ref 78.0–100.0)
Neutrophils Relative %: 91 % — ABNORMAL HIGH (ref 43–77)
Platelets: 369 10*3/uL (ref 150–400)
RDW: 15.2 % (ref 11.5–15.5)
WBC: 7.4 10*3/uL (ref 4.0–10.5)

## 2013-06-11 LAB — COMPREHENSIVE METABOLIC PANEL
ALT: 31 U/L (ref 0–53)
AST: 49 U/L — ABNORMAL HIGH (ref 0–37)
CO2: 31 mEq/L (ref 19–32)
Calcium: 8.8 mg/dL (ref 8.4–10.5)
Potassium: 4.1 mEq/L (ref 3.5–5.1)
Sodium: 137 mEq/L (ref 135–145)
Total Protein: 7.7 g/dL (ref 6.0–8.3)

## 2013-06-11 LAB — RAPID URINE DRUG SCREEN, HOSP PERFORMED
Barbiturates: NOT DETECTED
Benzodiazepines: NOT DETECTED
Cocaine: NOT DETECTED
Tetrahydrocannabinol: NOT DETECTED

## 2013-06-11 LAB — GLUCOSE, CAPILLARY: Glucose-Capillary: 198 mg/dL — ABNORMAL HIGH (ref 70–99)

## 2013-06-11 MED ORDER — LORAZEPAM 1 MG PO TABS: 0.0000 mg | ORAL_TABLET | Freq: Two times a day (BID) | ORAL | Status: DC

## 2013-06-11 MED ORDER — AMLODIPINE BESYLATE 10 MG PO TABS
10.0000 mg | ORAL_TABLET | Freq: Every day | ORAL | Status: DC
  Administered 2013-06-11: 10 mg via ORAL
  Filled 2013-06-11: qty 1

## 2013-06-11 MED ORDER — CHLORDIAZEPOXIDE HCL 25 MG PO CAPS
25.0000 mg | ORAL_CAPSULE | Freq: Four times a day (QID) | ORAL | Status: DC | PRN
  Filled 2013-06-11: qty 1

## 2013-06-11 MED ORDER — ONDANSETRON 4 MG PO TBDP: 4.0000 mg | ORAL_TABLET | Freq: Four times a day (QID) | ORAL | Status: DC | PRN

## 2013-06-11 MED ORDER — THIAMINE HCL 100 MG/ML IJ SOLN
100.0000 mg | Freq: Once | INTRAMUSCULAR | Status: AC
  Filled 2013-06-11: qty 2

## 2013-06-11 MED ORDER — GABAPENTIN 300 MG PO CAPS
600.0000 mg | ORAL_CAPSULE | Freq: Two times a day (BID) | ORAL | Status: DC
  Administered 2013-06-11 – 2013-06-15 (×8): 600 mg via ORAL
  Filled 2013-06-11 (×11): qty 2

## 2013-06-11 MED ORDER — LOPERAMIDE HCL 2 MG PO CAPS
2.0000 mg | ORAL_CAPSULE | ORAL | Status: AC | PRN
Start: 2013-06-11 — End: 2013-06-14

## 2013-06-11 MED ORDER — CHLORTHALIDONE 25 MG PO TABS
25.0000 mg | ORAL_TABLET | Freq: Every day | ORAL | Status: DC
  Administered 2013-06-11: 25 mg via ORAL
  Filled 2013-06-11: qty 1

## 2013-06-11 MED ORDER — KETOROLAC TROMETHAMINE 60 MG/2ML IM SOLN
60.0000 mg | Freq: Once | INTRAMUSCULAR | Status: AC
  Administered 2013-06-11: 60 mg via INTRAMUSCULAR
  Filled 2013-06-11: qty 2

## 2013-06-11 MED ORDER — SIMVASTATIN 10 MG PO TABS
10.0000 mg | ORAL_TABLET | Freq: Every day | ORAL | Status: DC
  Administered 2013-06-11: 10 mg via ORAL
  Filled 2013-06-11 (×2): qty 1

## 2013-06-11 MED ORDER — MICONAZOLE NITRATE 2 % EX CREA
TOPICAL_CREAM | Freq: Two times a day (BID) | CUTANEOUS | Status: DC | PRN
  Administered 2013-06-12: 1 via TOPICAL
  Administered 2013-06-15: 09:00:00 via TOPICAL
  Filled 2013-06-11: qty 14

## 2013-06-11 MED ORDER — CHLORTHALIDONE 25 MG PO TABS
25.0000 mg | ORAL_TABLET | Freq: Every day | ORAL | Status: DC
  Administered 2013-06-12 – 2013-06-15 (×4): 25 mg via ORAL
  Filled 2013-06-11 (×5): qty 1

## 2013-06-11 MED ORDER — ONDANSETRON 8 MG PO TBDP
8.0000 mg | ORAL_TABLET | Freq: Once | ORAL | Status: AC
  Administered 2013-06-11: 8 mg via ORAL
  Filled 2013-06-11: qty 1

## 2013-06-11 MED ORDER — MAGNESIUM HYDROXIDE 400 MG/5ML PO SUSP: 30.0000 mL | Freq: Every day | ORAL | Status: DC | PRN

## 2013-06-11 MED ORDER — ONDANSETRON 4 MG PO TBDP: 4.0000 mg | ORAL_TABLET | Freq: Four times a day (QID) | ORAL | Status: AC | PRN

## 2013-06-11 MED ORDER — HYDROXYZINE HCL 25 MG PO TABS: 25.0000 mg | ORAL_TABLET | Freq: Four times a day (QID) | ORAL | Status: DC | PRN

## 2013-06-11 MED ORDER — ONDANSETRON 8 MG PO TBDP
8.0000 mg | ORAL_TABLET | Freq: Once | ORAL | Status: AC
  Administered 2013-06-11: 8 mg via ORAL
  Filled 2013-06-11: qty 2

## 2013-06-11 MED ORDER — LIDOCAINE 5 % EX PTCH
1.0000 | MEDICATED_PATCH | CUTANEOUS | Status: DC
  Administered 2013-06-11: 1 via TRANSDERMAL
  Filled 2013-06-11: qty 1

## 2013-06-11 MED ORDER — METFORMIN HCL 500 MG PO TABS
1000.0000 mg | ORAL_TABLET | Freq: Every day | ORAL | Status: DC
  Administered 2013-06-12 – 2013-06-14 (×3): 1000 mg via ORAL
  Filled 2013-06-11 (×6): qty 2

## 2013-06-11 MED ORDER — LOPERAMIDE HCL 2 MG PO CAPS: 2.0000 mg | ORAL_CAPSULE | ORAL | Status: DC | PRN

## 2013-06-11 MED ORDER — LORAZEPAM 1 MG PO TABS
0.0000 mg | ORAL_TABLET | Freq: Four times a day (QID) | ORAL | Status: DC
  Administered 2013-06-11: 1 mg via ORAL
  Filled 2013-06-11: qty 1

## 2013-06-11 MED ORDER — INFLUENZA VAC SPLIT QUAD 0.5 ML IM SUSP
0.5000 mL | INTRAMUSCULAR | Status: AC
  Administered 2013-06-12: 0.5 mL via INTRAMUSCULAR
  Filled 2013-06-11: qty 0.5

## 2013-06-11 MED ORDER — METFORMIN HCL 500 MG PO TABS
1000.0000 mg | ORAL_TABLET | Freq: Every day | ORAL | Status: DC
  Filled 2013-06-11: qty 2

## 2013-06-11 MED ORDER — CHLORDIAZEPOXIDE HCL 25 MG PO CAPS
25.0000 mg | ORAL_CAPSULE | Freq: Four times a day (QID) | ORAL | Status: DC | PRN
  Administered 2013-06-11: 25 mg via ORAL

## 2013-06-11 MED ORDER — THIAMINE HCL 100 MG/ML IJ SOLN
100.0000 mg | Freq: Once | INTRAMUSCULAR | Status: AC
  Administered 2013-06-11: 100 mg via INTRAMUSCULAR

## 2013-06-11 MED ORDER — ADULT MULTIVITAMIN W/MINERALS CH
1.0000 | ORAL_TABLET | Freq: Every day | ORAL | Status: DC
  Administered 2013-06-12 – 2013-06-15 (×4): 1 via ORAL
  Filled 2013-06-11 (×5): qty 1

## 2013-06-11 MED ORDER — ACETAMINOPHEN 325 MG PO TABS
650.0000 mg | ORAL_TABLET | Freq: Four times a day (QID) | ORAL | Status: DC | PRN
  Administered 2013-06-12 – 2013-06-14 (×6): 650 mg via ORAL

## 2013-06-11 MED ORDER — CHLORDIAZEPOXIDE HCL 25 MG PO CAPS
25.0000 mg | ORAL_CAPSULE | Freq: Four times a day (QID) | ORAL | Status: AC | PRN
  Administered 2013-06-11: 25 mg via ORAL
  Filled 2013-06-11: qty 1

## 2013-06-11 MED ORDER — HYDROXYZINE HCL 25 MG PO TABS
25.0000 mg | ORAL_TABLET | Freq: Four times a day (QID) | ORAL | Status: AC | PRN
  Administered 2013-06-13: 25 mg via ORAL

## 2013-06-11 MED ORDER — ACETAMINOPHEN 325 MG PO TABS
650.0000 mg | ORAL_TABLET | Freq: Four times a day (QID) | ORAL | Status: DC | PRN
  Administered 2013-06-11: 650 mg via ORAL
  Filled 2013-06-11: qty 2

## 2013-06-11 MED ORDER — GABAPENTIN 600 MG PO TABS
600.0000 mg | ORAL_TABLET | Freq: Two times a day (BID) | ORAL | Status: DC
  Filled 2013-06-11 (×2): qty 1

## 2013-06-11 MED ORDER — LIDOCAINE 5 % EX PTCH
1.0000 | MEDICATED_PATCH | CUTANEOUS | Status: DC
  Administered 2013-06-12 – 2013-06-14 (×3): 1 via TRANSDERMAL
  Filled 2013-06-11 (×5): qty 1

## 2013-06-11 MED ORDER — TRAZODONE HCL 50 MG PO TABS
50.0000 mg | ORAL_TABLET | Freq: Every evening | ORAL | Status: DC | PRN
  Administered 2013-06-11 – 2013-06-12 (×2): 50 mg via ORAL
  Filled 2013-06-11 (×2): qty 1

## 2013-06-11 MED ORDER — ALUM & MAG HYDROXIDE-SIMETH 200-200-20 MG/5ML PO SUSP: 30.0000 mL | ORAL | Status: DC | PRN

## 2013-06-11 MED ORDER — LISINOPRIL 40 MG PO TABS
40.0000 mg | ORAL_TABLET | Freq: Once | ORAL | Status: AC
  Administered 2013-06-11: 40 mg via ORAL
  Filled 2013-06-11: qty 1

## 2013-06-11 MED ORDER — VITAMIN B-1 100 MG PO TABS: 100.0000 mg | ORAL_TABLET | Freq: Every day | ORAL | Status: DC

## 2013-06-11 MED ORDER — GABAPENTIN 300 MG PO CAPS
600.0000 mg | ORAL_CAPSULE | Freq: Two times a day (BID) | ORAL | Status: DC
  Administered 2013-06-11: 600 mg via ORAL
  Filled 2013-06-11 (×2): qty 2

## 2013-06-11 MED ORDER — SIMVASTATIN 10 MG PO TABS
10.0000 mg | ORAL_TABLET | Freq: Every day | ORAL | Status: DC
  Administered 2013-06-12 – 2013-06-14 (×3): 10 mg via ORAL
  Filled 2013-06-11 (×4): qty 1

## 2013-06-11 MED ORDER — AMLODIPINE BESYLATE 10 MG PO TABS
10.0000 mg | ORAL_TABLET | Freq: Every day | ORAL | Status: DC
  Administered 2013-06-12 – 2013-06-15 (×4): 10 mg via ORAL
  Filled 2013-06-11 (×5): qty 1

## 2013-06-11 MED ORDER — ADULT MULTIVITAMIN W/MINERALS CH
1.0000 | ORAL_TABLET | Freq: Every day | ORAL | Status: DC
  Administered 2013-06-11: 1 via ORAL

## 2013-06-11 MED ORDER — VITAMIN B-1 100 MG PO TABS
100.0000 mg | ORAL_TABLET | Freq: Every day | ORAL | Status: DC
  Administered 2013-06-12 – 2013-06-15 (×4): 100 mg via ORAL
  Filled 2013-06-11 (×5): qty 1

## 2013-06-11 MED ORDER — ADULT MULTIVITAMIN W/MINERALS CH
1.0000 | ORAL_TABLET | Freq: Every day | ORAL | Status: DC
  Filled 2013-06-11: qty 1

## 2013-06-11 NOTE — BH Assessment (Signed)
Tele Assessment Note   Angel Phillips is an 52 y.o. male.   Pt consumes over 200oz of alcohol per day.  He is on a 7 day binge after putting together 55 days of sobriety.  Pt reports the break up of his GF is the trigger for relapse.  Pt had put together 5 years of sobriety from 1997 to 2002.  Pt reports suicidal thoughts and intent at this time.  No prior SI hx or admits.  However, pt was in ER 55 days ago and intubated for alcohol poison.  Pt denies that was a SI attempt.  Pt denies HI and AVH.  Pt denies use of other related drugs.  Pt is teaful, speech is clear, judgment appears impaired, no hx of seizures, eye contact was fair, pt appeared to be going through withdraws, CIWA 10, pt last drink was 9-12.    Recommend admit to [facility] 300 hall bed.  [facility] will need to determine acceptance or decline when they review pt.  Currently no beds available at [facility].  [facility] and [facility] won't accept due to SI.    Axis I: Major Depression, Recurrent severe and Substance Abuse Axis II: Deferred Axis III:  Past Medical History  Diagnosis Date  . Hypertension   . Diabetes mellitus without complication   . Arthritis   . Tuberculosis     tb positive 1989 ?   Marland Kitchen Depression   . PONV (postoperative nausea and vomiting)   . Chronic kidney disease 02/2013    ACUTE RENAL FAILURE   Axis IV: other psychosocial or environmental problems, problems related to social environment and problems with primary support group Axis V: 41-50 serious symptoms  Past Medical History:  Past Medical History  Diagnosis Date  . Hypertension   . Diabetes mellitus without complication   . Arthritis   . Tuberculosis     tb positive 1989 ?   Marland Kitchen Depression   . PONV (postoperative nausea and vomiting)   . Chronic kidney disease 02/2013    ACUTE RENAL FAILURE    Past Surgical History  Procedure Laterality Date  . Knee arthroscopy    . Gastric bypass    . Spinal fusion      Harrington Rod    Family History: No family  history on file.  Social History:  reports that he has never smoked. He has never used smokeless tobacco. He reports that  drinks alcohol. He reports that he does not use illicit drugs.  Additional Social History:  Alcohol / Drug Use Pain Medications: na Prescriptions: na Over the Counter: na History of alcohol / drug use?: Yes Longest period of sobriety (when/how long): 5 yrs Negative Consequences of Use: Financial;Personal relationships Withdrawal Symptoms: Cramps;Fever / Chills;Irritability;Nausea / Vomiting;Tingling Substance #1 Name of Substance 1: alcohol 1 - Age of First Use: 15 1 - Amount (size/oz): 200oz+ 1 - Frequency: daily 1 - Duration: 5 to 7 day binge after 55 day sobriety 1 - Last Use / Amount: 06-10-13  CIWA: CIWA-Ar BP: 178/92 mmHg Pulse Rate: 102 Nausea and Vomiting: mild nausea with no vomiting Tactile Disturbances: very mild itching, pins and needles, burning or numbness Tremor: not visible, but can be felt fingertip to fingertip Auditory Disturbances: very mild harshness or ability to frighten Paroxysmal Sweats: barely perceptible sweating, palms moist Visual Disturbances: very mild sensitivity Anxiety: two Headache, Fullness in Head: mild Agitation: normal activity Orientation and Clouding of Sensorium: oriented and can do serial additions CIWA-Ar Total: 10 COWS:  Allergies: No Known Allergies  Home Medications:  (Not in a hospital admission)  OB/GYN Status:  No LMP for male patient.  General Assessment Data Location of Assessment: [facility] ED Is this a Tele or Face-to-Face Assessment?: Tele Assessment Is this an Initial Assessment or a Re-assessment for this encounter?: Initial Assessment Living Arrangements: Alone Can pt return to current living arrangement?: Yes Admission Status: Voluntary Is patient capable of signing voluntary admission?: Yes Transfer from: Acute Hospital Referral Source: MD  Medical Screening Exam [facility] Walk-in  ONLY) Medical Exam completed: Yes  [facility] Crisis Care Plan Living Arrangements: Alone  Education Status Is patient currently in school?: No  Risk to self Suicidal Ideation: Yes-Currently Present Suicidal Intent: Yes-Currently Present Is patient at risk for suicide?: Yes Suicidal Plan?: Yes-Currently Present Specify Current Suicidal Plan: "I don't know, I just I know I might do something" Access to Means: Yes Specify Access to Suicidal Means: has meds,  What has been your use of drugs/alcohol within the last 12 months?: alcohol Previous Attempts/Gestures: No How many times?: 0 Other Self Harm Risks: intubated for alcohol poison Triggers for Past Attempts: None known Intentional Self Injurious Behavior: None Family Suicide History: No Recent stressful life event(s): Conflict (Comment) (break up with GF) Persecutory voices/beliefs?: No Depression: Yes Depression Symptoms: Tearfulness;Isolating;Fatigue;Guilt;Loss of interest in usual pleasures;Feeling worthless/self pity Substance abuse history and/or treatment for substance abuse?: Yes Suicide prevention information given to non-admitted patients: Not applicable  Risk to Others Homicidal Ideation: No Thoughts of Harm to Others: No Current Homicidal Intent: No Current Homicidal Plan: No Access to Homicidal Means: No Identified Victim: na History of harm to others?: No Assessment of Violence: None Noted Violent Behavior Description: cooperative  Does patient have access to weapons?: No Criminal Charges Pending?: No Does patient have a court date: No  Psychosis Hallucinations: None noted Delusions: None noted  Mental Status Report Appear/Hygiene: Disheveled Eye Contact: Fair Motor Activity: Unremarkable Speech: Logical/coherent Level of Consciousness: Alert Mood: Depressed;Anxious;Ashamed/humiliated;Sad;Worthless, low self-esteem Affect: Anxious;Appropriate to circumstance;Depressed;Sad Anxiety Level: Moderate Thought  Processes: Coherent Judgement: Impaired Orientation: Person;Place;Situation;Appropriate for developmental age Obsessive Compulsive Thoughts/Behaviors: None  Cognitive Functioning Concentration: Decreased Memory: Recent Intact;Remote Intact IQ: Average Insight: Poor Impulse Control: Poor Appetite: Fair Weight Loss: 0 Weight Gain: 0 Sleep: Decreased Total Hours of Sleep: 4 Vegetative Symptoms: None  ADLScreening [facility] Assessment Services) Patient's cognitive ability adequate to safely complete daily activities?: Yes Patient able to express need for assistance with ADLs?: Yes Independently performs ADLs?: Yes (appropriate for developmental age) (son, ex-GF)  Prior Inpatient Therapy Prior Inpatient Therapy: No Prior Therapy Dates: no Prior Therapy Facilty/Provider(s): no Reason for Treatment: no  Prior Outpatient Therapy Prior Outpatient Therapy: No Prior Therapy Dates: no Prior Therapy Facilty/Provider(s): no Reason for Treatment: no  ADL Screening (condition at time of admission) Patient's cognitive ability adequate to safely complete daily activities?: Yes Is the patient deaf or have difficulty hearing?: No Does the patient have difficulty seeing, even when wearing glasses/contacts?: No Does the patient have difficulty concentrating, remembering, or making decisions?: No Patient able to express need for assistance with ADLs?: Yes Does the patient have difficulty dressing or bathing?: No Independently performs ADLs?: Yes (appropriate for developmental age) (son, ex-GF) Does the patient have difficulty walking or climbing stairs?: No Weakness of Legs: None Weakness of Arms/Hands: None  Home Assistive Devices/Equipment Home Assistive Devices/Equipment: None  Therapy Consults (therapy consults require a physician order) PT Evaluation Needed: No OT Evalulation Needed: No SLP Evaluation Needed: No Abuse/Neglect Assessment (Assessment to  be complete while patient is  alone) Physical Abuse: Denies Verbal Abuse: Denies Sexual Abuse: Denies Exploitation of patient/patient's resources: Denies Self-Neglect: Denies Values / Beliefs Cultural Requests During Hospitalization: None Spiritual Requests During Hospitalization: None Consults Spiritual Care Consult Needed: No Social Work Consult Needed: No Merchant navy officer (For Healthcare) Advance Directive: Patient does not have advance directive Pre-existing out of facility DNR order (yellow form or pink MOST form): No    Additional Information 1:1 In Past 12 Months?: No CIRT Risk: No Elopement Risk: No Does patient have medical clearance?: No     Disposition:  Disposition Initial Assessment Completed for this Encounter: Yes Disposition of Patient: Inpatient treatment program Type of inpatient treatment program: Adult  Macon Large 06/11/2013 12:56 PM

## 2013-06-11 NOTE — ED Provider Notes (Signed)
CSN: [id number]     Arrival date & time 06/11/13  0854 History   First MD Initiated Contact with Patient 06/11/13 [phone]     Chief Complaint  Patient presents with  . Medical Clearance   (Consider location/radiation/quality/duration/timing/severity/associated sxs/prior Treatment) HPI Comments: Patient here for detox from alcohol - reports a long history of daily drinking - states that he has been on a binge of drinking "as much as I can get" for the past 6 days - states that his last drink was yesterday afternoon - has a history of alcohol withdrawal and has been intubated for this as well.  He reports feeling shaky right now and also with nausea and vomiting x 2 today.  Denies suicidal or homicidal ideation but reports feeling depressed.  Patient is a 52 y.o. male presenting with alcohol problem. The history is provided by the patient. No language interpreter was used.  Alcohol Problem This is a chronic problem. The current episode started yesterday. The problem occurs constantly. The problem has been unchanged. Associated symptoms include nausea and vomiting. Pertinent negatives include no abdominal pain, anorexia, change in bowel habit, chest pain, congestion, coughing, diaphoresis, fatigue, fever, myalgias, neck pain, numbness, rash, sore throat, vertigo or weakness. Nothing aggravates the symptoms. He has tried nothing for the symptoms. The treatment provided no relief.    Past Medical History  Diagnosis Date  . Hypertension   . Diabetes mellitus without complication   . Arthritis   . Tuberculosis     tb positive 1989 ?   Marland Kitchen Depression   . PONV (postoperative nausea and vomiting)   . Chronic kidney disease 02/2013    ACUTE RENAL FAILURE   Past Surgical History  Procedure Laterality Date  . Knee arthroscopy    . Gastric bypass    . Spinal fusion      Harrington Rod   No family history on file. History  Substance Use Topics  . Smoking status: Never Smoker   . Smokeless tobacco:  Never Used  . Alcohol Use: Yes     Comment: hx of   " anything "   trying quit right now "    Review of Systems  Constitutional: Negative for fever, diaphoresis and fatigue.  HENT: Negative for congestion, sore throat and neck pain.   Respiratory: Negative for cough.   Cardiovascular: Negative for chest pain.  Gastrointestinal: Positive for nausea and vomiting. Negative for abdominal pain, anorexia and change in bowel habit.  Musculoskeletal: Negative for myalgias.  Skin: Negative for rash.  Neurological: Negative for vertigo, weakness and numbness.  All other systems reviewed and are negative.    Allergies  Review of patient's allergies indicates no known allergies.  Home Medications   Current Outpatient Rx  Name  Route  Sig  Dispense  Refill  . amLODipine (NORVASC) 10 MG tablet   Oral   Take 10 mg by mouth daily.         . diphenhydrAMINE (BENADRYL) 50 MG tablet   Oral   Take 50 mg by mouth at bedtime as needed for sleep.         Marland Kitchen glipiZIDE (GLUCOTROL) 5 MG tablet   Oral   Take 15 mg by mouth 2 (two) times daily before a meal.         . hydrochlorothiazide (HYDRODIURIL) 12.5 MG tablet   Oral   Take 12.5 mg by mouth daily.         Marland Kitchen lisinopril (PRINIVIL,ZESTRIL) 20 MG tablet  Oral   Take 0.5 tablets (10 mg total) by mouth daily.         . metFORMIN (GLUCOPHAGE) 1000 MG tablet   Oral   Take 1,000 mg by mouth 2 (two) times daily with a meal.          BP 176/108  Pulse 115  Temp(Src) 98.4 F (36.9 C) (Oral)  Resp 20  SpO2 100% Physical Exam  Nursing note and vitals reviewed. Constitutional: He is oriented to person, place, and time. He appears well-developed and well-nourished. No distress.  HENT:  Head: Normocephalic and atraumatic.  Right Ear: External ear normal.  Left Ear: External ear normal.  Nose: Nose normal.  Mouth/Throat: Oropharynx is clear and moist. No oropharyngeal exudate.  Cardiovascular: Regular rhythm and normal heart  sounds.  Exam reveals no gallop and no friction rub.   No murmur heard. tachycardia  Pulmonary/Chest: Effort normal and breath sounds normal. No respiratory distress. He exhibits no tenderness.  Abdominal: Soft. Bowel sounds are normal. He exhibits no distension. There is no tenderness.  Musculoskeletal: Normal range of motion. He exhibits no edema and no tenderness.  Neurological: He is alert and oriented to person, place, and time. No cranial nerve deficit.  Skin: Skin is warm and dry. No rash noted. No erythema. No pallor.  Psychiatric: He has a normal mood and affect. His behavior is normal. Judgment and thought content normal.    ED Course  Procedures (including critical care time) Labs Review Labs Reviewed  CBC WITH DIFFERENTIAL  COMPREHENSIVE METABOLIC PANEL  ETHANOL  URINE RAPID DRUG SCREEN (HOSP PERFORMED)  ACETAMINOPHEN LEVEL  SALICYLATE LEVEL  MAGNESIUM   Imaging Review No results found.  MDM  Alcohol Detox  Patient with long history of alcoholism presents requesting detox from alcohol - medically stable - have informed Volney American, NP of patient for over night.  Izola Price Marisue Humble, PA-C 06/15/13 2217

## 2013-06-11 NOTE — ED Notes (Signed)
Patient expressed that he was angry at his mother for dying. He has not yet grieved--stating that today was the first time that he has cried.

## 2013-06-11 NOTE — ED Notes (Signed)
Patient does not want wife to receive any more information. Patient states that he thought wife was trying to help him but she is trying to get him placed somewhere he does not want to go

## 2013-06-11 NOTE — ED Notes (Signed)
Pt alert x 4 v/s stable tx to [facility] report given. The pt is involuntary, transported via [facility]. Will monitor.

## 2013-06-11 NOTE — Progress Notes (Signed)
Admission Note:  52 yr male who presents IVC in no acute distress for the treatment of ETOH detox and Depression. Pt appears flat and depressed. Pt was calm and cooperative with admission process, pt became tearful during some of the admission. Pt +ve VH- sees cartoon characters when he closes his eyes. Pt denies SI/HI/AH. Pt states he has been depressed more since his mother died 9 months ago. "I need to deal with all the tragedies in my life, I don't deal with immediate grief of feelings when I need to, I was mad at my mother dying". Pt was in the hospital last week for ETOH poisoning.   Pt has Past medical Hx of Asthma, Depression, Harrington rod, Gastric bypass surgery (Pt states " I used to weigh 400 lb), liver failure .   Skin was assessed and found to be clear of any abnormal marks apart from scar on back and L-buttock . Pt has inflammation on L-ankle, dry skin and eczema on face. POC and unit policies explained and understanding verbalized. Consents obtained. Food and fluids offered, and fluids accepted. Pt had no additional questions or concerns.

## 2013-06-11 NOTE — ED Notes (Signed)
Pt has not taken antihypertensive meds or "diabetes meds"  x5 days

## 2013-06-11 NOTE — ED Notes (Signed)
Patient's son called

## 2013-06-11 NOTE — BHH Counselor (Signed)
Vernona Rieger accepted pt to [facility] and he will go to room 302-1 to the services of Dr. Dub Mikes.  Pt is voluntary and will come by security.    Nurse will need to do Voluntary form and fax to [facility] and then send original with security.  Pt can come after 1700.

## 2013-06-11 NOTE — Consult Note (Signed)
[facility] Face-to-Face Psychiatry Consult   Reason for Consult:  Alcohol abuse, Major depressive d/o recurrent severe Referring Physician:  EDP Angel Phillips is an 52 y.o. male.  Assessment: AXIS I:  Alcohol Abuse, Major depressive d/o recurrent severe AXIS II:  Deferred AXIS III:   Past Medical History  Diagnosis Date  . Hypertension   . Diabetes mellitus without complication   . Arthritis   . Tuberculosis     tb positive 1989 ?   Marland Kitchen Depression   . PONV (postoperative nausea and vomiting)   . Chronic kidney disease 02/2013    ACUTE RENAL FAILURE   AXIS IV:  other psychosocial or environmental problems, problems related to social environment and problems with primary support group AXIS V:  41-50 serious symptoms  Plan:  Recommend psychiatric Inpatient admission when medically cleared.  Subjective:   Angel Phillips is a 52 y.o. male patient admitted with Alcohol dependence, Major depressive d/o.  HPI:  Pt consumes over 4 x 20 oz of alcohol per day. He is on a 7 day binge after putting together 55 days of sobriety. Pt reports the break up of his GF is the trigger for relapse. Pt had put together 5 years of sobriety from 1997 to 2002. Pt reports suicidal thoughts and intent at this time. No prior SI hx or admits. However, pt was in ER 55 days ago and intubated for alcohol poison. Pt denies that was a SI attempt. Pt denies HI and AVH. Pt denies use of other related drugs.  Pt is teaful, speech is clear, judgment appears impaired, no hx of seizures, eye contact was poor. His alcohol level is <11 at this time.  Patient reports his withdrawal symptoms includes nausea, vomiting and tremors.  Patient was detoxed in the 1980's at the [place] in [place] and only relapsed this past labor day.  His alcohol "of choice are everything in the market"; wine, beer and liquor.  Patient is seeking treatment and plans to join AA meetings again.  He credits his long sobriety to joining [employer]  We will admit him when  bed is available and also seek placement anywhere we can secure a bed.  Meanwhile we will use our librium protocol to detox patient.     HPI Elements:   Location:  [facility]. Quality:  SEVERE. Severity:  SEVERE.  Past Psychiatric History: Past Medical History  Diagnosis Date  . Hypertension   . Diabetes mellitus without complication   . Arthritis   . Tuberculosis     tb positive 1989 ?   Marland Kitchen Depression   . PONV (postoperative nausea and vomiting)   . Chronic kidney disease 02/2013    ACUTE RENAL FAILURE    reports that he has never smoked. He has never used smokeless tobacco. He reports that  drinks alcohol. He reports that he does not use illicit drugs. No family history on file. Family History Substance Abuse: No Family Supports: Yes, List: Living Arrangements: Alone Can pt return to current living arrangement?: Yes Abuse/Neglect [facility]) Physical Abuse: Denies Verbal Abuse: Denies Sexual Abuse: Denies Allergies:  No Known Allergies  ACT Assessment Complete:  Yes:    Educational Status    Risk to Self: Risk to self Suicidal Ideation: Yes-Currently Present Suicidal Intent: Yes-Currently Present Is patient at risk for suicide?: Yes Suicidal Plan?: Yes-Currently Present Specify Current Suicidal Plan: "I don't know, I just I know I might do something" Access to Means: Yes Specify Access to Suicidal Means: has meds,  What  has been your use of drugs/alcohol within the last 12 months?: alcohol Previous Attempts/Gestures: No How many times?: 0 Other Self Harm Risks: intubated for alcohol poison Triggers for Past Attempts: None known Intentional Self Injurious Behavior: None Family Suicide History: No Recent stressful life event(s): Conflict (Comment) (break up with GF) Persecutory voices/beliefs?: No Depression: Yes Depression Symptoms: Tearfulness;Isolating;Fatigue;Guilt;Loss of interest in usual pleasures;Feeling worthless/self pity Substance abuse history and/or treatment for  substance abuse?: Yes Suicide prevention information given to non-admitted patients: Not applicable  Risk to Others: Risk to Others Homicidal Ideation: No Thoughts of Harm to Others: No Current Homicidal Intent: No Current Homicidal Plan: No Access to Homicidal Means: No Identified Victim: na History of harm to others?: No Assessment of Violence: None Noted Violent Behavior Description: cooperative  Does patient have access to weapons?: No Criminal Charges Pending?: No Does patient have a court date: No  Abuse: Abuse/Neglect Assessment (Assessment to be complete while patient is alone) Physical Abuse: Denies Verbal Abuse: Denies Sexual Abuse: Denies Exploitation of patient/patient's resources: Denies Self-Neglect: Denies  Prior Inpatient Therapy: Prior Inpatient Therapy Prior Inpatient Therapy: No Prior Therapy Dates: no Prior Therapy Facilty/Provider(s): no Reason for Treatment: no  Prior Outpatient Therapy: Prior Outpatient Therapy Prior Outpatient Therapy: No Prior Therapy Dates: no Prior Therapy Facilty/Provider(s): no Reason for Treatment: no  Additional Information: Additional Information 1:1 In Past 12 Months?: No CIRT Risk: No Elopement Risk: No Does patient have medical clearance?: No                  Objective: Blood pressure 167/82, pulse 106, temperature 98.9 F (37.2 C), temperature source Oral, resp. rate 18, SpO2 100.00%.There is no weight on file to calculate BMI. Results for orders placed during the hospital encounter of 06/11/13 (from the past 72 hour(s))  CBC WITH DIFFERENTIAL     Status: Abnormal   Collection Time    06/11/13  9:28 AM      Result Value Range   WBC 7.4  4.0 - 10.5 K/uL   RBC 4.86  4.22 - 5.81 MIL/uL   Hemoglobin 14.3  13.0 - 17.0 g/dL   HCT 40.9  81.1 - 91.4 %   MCV 86.0  78.0 - 100.0 fL   MCH 29.4  26.0 - 34.0 pg   MCHC 34.2  30.0 - 36.0 g/dL   RDW 78.2  95.6 - 21.3 %   Platelets 369  150 - 400 K/uL    Neutrophils Relative % 91 (*) 43 - 77 %   Neutro Abs 6.8  1.7 - 7.7 K/uL   Lymphocytes Relative 5 (*) 12 - 46 %   Lymphs Abs 0.3 (*) 0.7 - 4.0 K/uL   Monocytes Relative 4  3 - 12 %   Monocytes Absolute 0.3  0.1 - 1.0 K/uL   Eosinophils Relative 0  0 - 5 %   Eosinophils Absolute 0.0  0.0 - 0.7 K/uL   Basophils Relative 0  0 - 1 %   Basophils Absolute 0.0  0.0 - 0.1 K/uL  COMPREHENSIVE METABOLIC PANEL     Status: Abnormal   Collection Time    06/11/13  9:28 AM      Result Value Range   Sodium 137  135 - 145 mEq/L   Potassium 4.1  3.5 - 5.1 mEq/L   Chloride 90 (*) 96 - 112 mEq/L   CO2 31  19 - 32 mEq/L   Glucose, Bld 227 (*) 70 - 99 mg/dL   BUN 19  6 - 23 mg/dL   Creatinine, Ser 1.61  0.50 - 1.35 mg/dL   Calcium 8.8  8.4 - 09.6 mg/dL   Total Protein 7.7  6.0 - 8.3 g/dL   Albumin 3.1 (*) 3.5 - 5.2 g/dL   AST 49 (*) 0 - 37 U/L   ALT 31  0 - 53 U/L   Alkaline Phosphatase 146 (*) 39 - 117 U/L   Total Bilirubin 1.0  0.3 - 1.2 mg/dL   GFR calc non Af Amer 67 (*) >90 mL/min   GFR calc Af Amer 77 (*) >90 mL/min   Comment: (NOTE)     The eGFR has been calculated using the CKD EPI equation.     This calculation has not been validated in all clinical situations.     eGFR's persistently <90 mL/min signify possible Chronic Kidney     Disease.  ETHANOL     Status: None   Collection Time    06/11/13  9:28 AM      Result Value Range   Alcohol, Ethyl (B) <11  0 - 11 mg/dL   Comment:            LOWEST DETECTABLE LIMIT FOR     SERUM ALCOHOL IS 11 mg/dL     FOR MEDICAL PURPOSES ONLY  ACETAMINOPHEN LEVEL     Status: None   Collection Time    06/11/13  9:28 AM      Result Value Range   Acetaminophen (Tylenol), Serum <15.0  10 - 30 ug/mL   Comment:            THERAPEUTIC CONCENTRATIONS VARY     SIGNIFICANTLY. A RANGE OF 10-30     ug/mL MAY BE AN EFFECTIVE     CONCENTRATION FOR MANY PATIENTS.     HOWEVER, SOME ARE BEST TREATED     AT CONCENTRATIONS OUTSIDE THIS     RANGE.      ACETAMINOPHEN CONCENTRATIONS     >150 ug/mL AT 4 HOURS AFTER     INGESTION AND >50 ug/mL AT 12     HOURS AFTER INGESTION ARE     OFTEN ASSOCIATED WITH TOXIC     REACTIONS.  SALICYLATE LEVEL     Status: Abnormal   Collection Time    06/11/13  9:28 AM      Result Value Range   Salicylate Lvl <2.0 (*) 2.8 - 20.0 mg/dL  MAGNESIUM     Status: None   Collection Time    06/11/13  9:28 AM      Result Value Range   Magnesium 1.6  1.5 - 2.5 mg/dL  GLUCOSE, CAPILLARY     Status: Abnormal   Collection Time    06/11/13 10:45 AM      Result Value Range   Glucose-Capillary 198 (*) 70 - 99 mg/dL   Labs are reviewed and are pertinent for SOME ABNORMAL CHEMISTRY READING, LOW magnesium,   Current Facility-Administered Medications  Medication Dose Route Frequency Provider Last Rate Last Dose  . acetaminophen (TYLENOL) tablet 650 mg  650 mg Oral Q6H PRN Scarlette Calico C. Sanford, PA-C   650 mg at 06/11/13 1053  . amLODipine (NORVASC) tablet 10 mg  10 mg Oral Daily Frances C. Sanford, PA-C   10 mg at 06/11/13 1128  . chlorthalidone (HYGROTON) tablet 25 mg  25 mg Oral Daily Frances C. Sanford, PA-C   25 mg at 06/11/13 1128  . gabapentin (NEURONTIN) capsule 600 mg  600 mg Oral BID Glynn Octave, MD  600 mg at 06/11/13 1154  . lidocaine (LIDODERM) 5 % 1 patch  1 patch Transdermal Q24H Scarlette Calico C. Sanford, PA-C   1 patch at 06/11/13 1127  . LORazepam (ATIVAN) tablet 0-4 mg  0-4 mg Oral Q6H Frances C. Sanford, PA-C   1 mg at 06/11/13 1053   Followed by  . [START ON 06/13/2013] LORazepam (ATIVAN) tablet 0-4 mg  0-4 mg Oral Q12H Frances C. Sanford, PA-C      . [START ON 06/12/2013] metFORMIN (GLUCOPHAGE) tablet 1,000 mg  1,000 mg Oral Q breakfast Scarlette Calico C. Sanford, PA-C      . simvastatin (ZOCOR) tablet 10 mg  10 mg Oral q1800 Frances C. Marisue Humble, PA-C       Current Outpatient Prescriptions  Medication Sig Dispense Refill  . alprostadil (MUSE) 1000 MCG pellet 1,000 mcg by Transurethral route as needed for  erectile dysfunction. use no more than 3 times per week      . amLODipine (NORVASC) 10 MG tablet Take 10 mg by mouth daily.      . B Complex Vitamins (VITAMIN B COMPLEX IJ) Inject as directed every 30 (thirty) days.      . chlorthalidone (HYGROTON) 25 MG tablet Take 25 mg by mouth daily.      Marland Kitchen gabapentin (NEURONTIN) 600 MG tablet Take 1,200 mg by mouth 2 (two) times daily.      Marland Kitchen glipiZIDE (GLUCOTROL) 10 MG tablet Take 10 mg by mouth 2 (two) times daily before a meal.      . lisinopril (PRINIVIL,ZESTRIL) 20 MG tablet Take 0.5 tablets (10 mg total) by mouth daily.      . metFORMIN (GLUCOPHAGE) 1000 MG tablet Take 1,000 mg by mouth 2 (two) times daily with a meal.        Psychiatric Specialty Exam:     Blood pressure 167/82, pulse 106, temperature 98.9 F (37.2 C), temperature source Oral, resp. rate 18, SpO2 100.00%.There is no weight on file to calculate BMI.  General Appearance: Casual and Disheveled  Eye Contact::  None  Speech:  Clear and Coherent and Normal Rate  Volume:  Normal  Mood:  Angry, Anxious, Depressed, Hopeless, Irritable and Worthless  Affect:  Congruent  Thought Process:  Coherent, Intact and Logical  Orientation:  Full (Time, Place, and Person)  Thought Content:  Hallucinations: Visual  Suicidal Thoughts:  No  Homicidal Thoughts:  No  Memory:  Immediate;   Fair Recent;   Good Remote;   Poor  Judgement:  Poor  Insight:  Lacking and Shallow  Psychomotor Activity:  Normal  Concentration:  Fair  Recall:  NA  Akathisia:  NA  Handed:  Right  AIMS (if indicated):     Assets:  Desire for Improvement  Sleep:      Treatment Plan Summary:  Will consult with Dr Elsie Saas We will admit patient to our Chemical dependency unit We will use our Librium protocol We will treat his medical and other psychiatric conditions Daily contact with patient to assess and evaluate symptoms and progress in treatment Medication management  Earney Navy   PMHNO-BC 06/11/2013 3:20 PM  Reviewed the information documented and agree with the treatment plan.  Jeris Easterly,JANARDHAHA R. 06/11/2013 5:00 PM

## 2013-06-11 NOTE — ED Notes (Signed)
Josephine the NP in to eval

## 2013-06-11 NOTE — ED Notes (Signed)
Pt requesting detox from [facility]. Has been drinking for the past 6 days. Last drink was last night, drank 3 pints MD22, 4 40's, and 2 large jugs of ENJ liquor. Pt reports recent breakup with his girlfriend, states he has been very depressed and needs help.

## 2013-06-12 DIAGNOSIS — F1010 Alcohol abuse, uncomplicated: Secondary | ICD-10-CM

## 2013-06-12 DIAGNOSIS — F329 Major depressive disorder, single episode, unspecified: Secondary | ICD-10-CM

## 2013-06-12 MED ORDER — CITALOPRAM HYDROBROMIDE 20 MG PO TABS
20.0000 mg | ORAL_TABLET | Freq: Every day | ORAL | Status: DC
  Filled 2013-06-12 (×2): qty 1

## 2013-06-12 MED ORDER — SERTRALINE HCL 25 MG PO TABS
25.0000 mg | ORAL_TABLET | Freq: Every day | ORAL | Status: DC
  Administered 2013-06-12 – 2013-06-14 (×3): 25 mg via ORAL
  Filled 2013-06-12 (×5): qty 1

## 2013-06-12 MED ORDER — LISINOPRIL 10 MG PO TABS
10.0000 mg | ORAL_TABLET | Freq: Every day | ORAL | Status: DC
  Administered 2013-06-12 – 2013-06-15 (×4): 10 mg via ORAL
  Filled 2013-06-12 (×5): qty 1

## 2013-06-12 MED ORDER — RISPERIDONE 0.25 MG PO TABS
0.2500 mg | ORAL_TABLET | Freq: Two times a day (BID) | ORAL | Status: DC
  Administered 2013-06-12: 0.25 mg via ORAL
  Filled 2013-06-12 (×4): qty 1

## 2013-06-12 NOTE — Progress Notes (Signed)
Patient ID: Malek Skog, male   DOB: 12-27-1960, 52 y.o.   MRN: [id number] Psychoeducational Group Note  Date:  06/12/2013 Time:  0900am  Group Topic/Focus:  Making Healthy Choices:   The focus of this group is to help patients identify negative/unhealthy choices they were using prior to admission and identify positive/healthier coping strategies to replace them upon discharge.  Participation Level:  Active  Participation Quality:  Appropriate  Affect:  Appropriate  Cognitive:  Appropriate  Insight:  Supportive  Engagement in Group:  Supportive  Additional Comments:  Inventory sheet group   Valente David 06/12/2013,9:45 AM

## 2013-06-12 NOTE — Progress Notes (Signed)
Psychoeducational Group Note  Date:  06/12/2013 Time:  0945 am  Group Topic/Focus:  Making Healthy Choices:   The focus of this group is to help patients identify negative/unhealthy choices they were using prior to admission and identify positive/healthier coping strategies to replace them upon discharge.  Participation Level:  Active  Participation Quality:  Appropriate  Affect:  Appropriate  Cognitive:  Alert and Appropriate  Insight:  Developing/Improving  Engagement in Group:  Developing/Improving  Additional Comments:    Willadean Guyton J 06/12/2013, 10:29 AM 

## 2013-06-12 NOTE — Progress Notes (Signed)
Patient ID: Angel Phillips, male   DOB: [birth year], 52 y.o.   MRN: [id number] 06-12-13 nursing shift note: d: pt has a hx of alcoholism, htn and depression. He has been visible in the milieu, going to groups and interacting with his peers. He had complaints of a headache this am and a general body ache. He denied any si/hi/av. He also needs frequent small meals due to a past bariatric procedure. A: his pain issues have been addressed with tylenol. Staff continues to encourage and support this patient. R: on his inventory sheet he wrote: he requested sleep medication, appetite improving, energy low, attention good with his depression at 6 and his hopelessness at 3. He listed no withdrawal symptoms. His pain goal is a 4 today. After discharge he plans to "go to the gym, join a church, join AA and try to get back in school". RN will continue to monitor and Q 15 min ck's continue.
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
## 2013-06-12 NOTE — [facility] Suicide Risk Assessment (Signed)
Suicide Risk Assessment  Admission Assessment     Nursing information obtained from:   EMR Demographic factors:   Loss of relationship Current Mental Status:    Psychiatric Specialty Exam:  Blood pressure 144/86, pulse 94, temperature 97.9 F (36.6 C), temperature source Oral, resp. rate 19, height 5\' 6"  (1.676 m), weight 78.926 kg (174 lb), SpO2 98.00%.Body mass index is 28.1 kg/(m^2).   General Appearance: Disheveled   Eye Contact:: Poor   Speech: Clear and Coherent   Volume: Normal   Mood: Depressed and Hopeless   Affect: Congruent and Tearful   Thought Process: Circumstantial   Orientation: Full (Time, Place, and Person)   Thought Content: Obsessions and Rumination   Suicidal Thoughts: No   Homicidal Thoughts: No   Memory: Immediate; Fair  Recent; Fair  Remote; Fair   Judgement: Impaired   Insight: Lacking   Psychomotor Activity: Normal   Concentration: Good   Recall: Good   Akathisia: No   Handed:   AIMS (if indicated):   Assets: Communication Skills  Desire for Improvement  Social Support  Vocational/Educational   Sleep: Number of Hours: 6    Loss Factors:    Historical Factors:    Risk Reduction Factors:   social support  CLINICAL FACTORS:   Depression:   Anhedonia Comorbid alcohol abuse/dependence Severe Alcohol/Substance Abuse/Dependencies  COGNITIVE FEATURES THAT CONTRIBUTE TO RISK:  Closed-mindedness Polarized thinking Thought constriction (tunnel vision)    SUICIDE RISK:   Minimal: No identifiable suicidal ideation.  Patients presenting with no risk factors but with morbid ruminations; may be classified as minimal risk based on the severity of the depressive symptoms  PLAN OF CARE: Observation Level/Precautions: Detox  15 minute checks   Laboratory: Per emergency department   Psychotherapy: Attend groups   Medications: Trazodone 50 mg at bedtime, Celexa 20 mg at bedtime, Risperdal 0.25 mg twice a day   Consultations: None   Discharge  Concerns: Risk for relapse on alcohol   Estimated LOS: 5-7 days   Other:     I certify that inpatient services furnished can reasonably be expected to improve the patient's condition.  Alverna Fawley 06/12/2013, 3:53 PM

## 2013-06-12 NOTE — [facility] Group Notes (Signed)
[facility] Group Notes:  (Clinical Social Work)  06/12/2013  10:00-11:00AM  Summary of Progress/Problems:   The main focus of today's process group was to   identify the patient's current support system and decide on other supports that can be put in place.  The picture on workbook was used to discuss why additional supports are needed, and a hand-out was distributed with four definitions/levels of support, then used to talk about how patients have given and received all different kinds of support.  An emphasis was placed on using counselor, doctor, therapy groups, 12-step groups, and problem-specific support groups to expand supports.  The patient identified himself as a veteran who trying to return to services with the [facility] specifically group therapy.  He is nervous about going to an individual counselor but is willing to try.  Type of Therapy:  Process Group with Motivational Interviewing  Participation Level:  Active  Participation Quality:  Attentive and Sharing  Affect:  Anxious and Blunted  Cognitive:  Oriented  Insight:  Engaged  Engagement in Therapy:  Engaged  Modes of Intervention:   Education, Support and Processing, Activity  [employer], LCSW 06/12/2013, 12:23 PM
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
## 2013-06-13 DIAGNOSIS — F4321 Adjustment disorder with depressed mood: Secondary | ICD-10-CM

## 2013-06-13 DIAGNOSIS — F1020 Alcohol dependence, uncomplicated: Secondary | ICD-10-CM | POA: Diagnosis present

## 2013-06-13 DIAGNOSIS — Z634 Disappearance and death of family member: Secondary | ICD-10-CM | POA: Diagnosis not present

## 2013-06-13 DIAGNOSIS — F4329 Adjustment disorder with other symptoms: Secondary | ICD-10-CM | POA: Diagnosis present

## 2013-06-13 LAB — GLUCOSE, CAPILLARY: Glucose-Capillary: 119 mg/dL — ABNORMAL HIGH (ref 70–99)

## 2013-06-13 MED ORDER — DIPHENHYDRAMINE HCL 25 MG PO CAPS
50.0000 mg | ORAL_CAPSULE | Freq: Every evening | ORAL | Status: DC | PRN
  Administered 2013-06-13 – 2013-06-14 (×2): 50 mg via ORAL

## 2013-06-13 NOTE — [facility] Suicide Risk Assessment (Signed)
[facility] INPATIENT: Family/Significant Other Suicide Prevention Education  Suicide Prevention Education:  Education Completed; No one has been identified by the patient as the family member/significant other with whom the patient will be residing, and identified as the person(s) who will aid the patient in the event of a mental health crisis (suicidal ideations/suicide attempt).   Pt did not c/o SI at admission, nor have they endorsed SI during their stay here. SPE not required. SPI pamphlet provided to pt and he was encouraged to share this information with his support network.   Dayanis Bergquist Smart, LCSWA 06/13/2013 12:47 PM   

## 2013-06-13 NOTE — [facility] Group Notes (Signed)
[facility] LCSW Aftercare Discharge Planning Group Note   06/13/2013 11:02 AM  Participation Quality:  Appropriate   Mood/Affect:  Appropriate  Depression Rating:  6  Anxiety Rating:  6  Thoughts of Suicide:  No Will you contract for safety?   NA  Current AVH:  No  Plan for Discharge/Comments:  Pt reports that he decided to come to [facility] for detox and for treatment for depression. He reports no SI/HI and states that he is set up through [facility] but has not been able to get appt due to waitlist for services. Pt reports that w/d symptoms are subsiding but that he still feels shaky. Pt interested in [facility] and will f/u with [facility] for o/p if appt cannot be scheduled at [facility] for the next week.   Transportation Means: sponsor  Supports: sponsor/ no family identified.   Smart, Evamaria Detore 06/13/2013 11:05 AM

## 2013-06-13 NOTE — Progress Notes (Signed)
Patient ID: Angel Phillips, male   DOB: May 11, 1961, 52 y.o.   MRN: [id number] D)  Has been out and about on the hall this evening, watched some football before group started, in good spirits.  Attended group, came to med window afterward, states has some discomfort in rt hip, rated at 8, and was given tylenol and heat packs which brought some relief.  Has been pleasant and cooperative, appreciative of care and meds, compliant.  Denies thoughts of self harm. A)  Will continue to monitor for safety, continue POC R)  Safety maintained.

## 2013-06-13 NOTE — Progress Notes (Addendum)
NUTRITION ASSESSMENT  Pt identified as at risk on the Malnutrition Screen Tool  INTERVENTION: 1. Educated patient on the importance of nutrition and encouraged intake of food and beverages.  Educated on protein sources and protein goals with Bariatric meal plan.  Provided written information on protein sources.  Teach back method used.   2. Discussed weight goals.  Discussed more appropriate weight of 170 lbs.   3.  3 meals and 3 high protein snacks daily 4.  MVI and thiamine daily.  NUTRITION DIAGNOSIS: Unintentional weight loss related to sub-optimal intake as evidenced by pt report.   Goal: Pt to meet >/= 90% of their estimated nutrition needs.  Monitor:  PO intake  Assessment:  Patient admitted for etoh abuse and depression.  Hx includes DM currently receiving Glucophage. HgbA1C 6/16 was 7.8.  Hx of Gastric Bypass surgery in 2001 with >220 lb weight loss.  Patient with a 60 lb weight loss (25%) in the last 3 months.  Patient reports that this weight loss has been partially intentional as his stated goal weight is "140 lbs".    52 y.o. male  Height: Ht Readings from Last 1 Encounters:  06/11/13 5\' 6"  (1.676 m)  5'10" per patient.    Weight: Wt Readings from Last 1 Encounters:  06/11/13 174 lb (78.926 kg)    Weight Hx: Wt Readings from Last 10 Encounters:  06/11/13 174 lb (78.926 kg)  03/17/13 234 lb 14.4 oz (106.55 kg)    BMI:  25 based on patient's stated height. Patient's weight is wnl based on current BMI.  Estimated Nutritional Needs: Kcal: 25-30 kcal/kg Protein: > 1 gram protein/kg Fluid: 1 ml/kcal  Diet Order: Carb Control Pt is also offered choice of unit snacks mid-morning and mid-afternoon.  Pt is eating as desired.   Lab results and medications reviewed.   Oran Rein, RD, LDN Clinical Inpatient Dietitian Pager:  [phone] Weekend and after hours pager:  [phone]

## 2013-06-13 NOTE — [facility] Group Notes (Signed)
[facility] LCSW Group Therapy  06/13/2013 4:15 PM  Type of Therapy:  Group Therapy  Participation Level:  Did Not Attend  Smart, Herbert Seta 06/13/2013, 4:15 PM

## 2013-06-13 NOTE — Progress Notes (Signed)
D) Pt. C/o lower right back pain early in shift. Pt appears depressed, but interaction is pleasant.  Denies SI per self inventory. Pt. Attending groups. A)  Patch applied mid-morning for pain. Support measures offered.  R) Pt. Receptive and reports pain decrease to 3 or 4 from a 7.  Pt. Expressing appreciation for care.

## 2013-06-13 NOTE — Progress Notes (Signed)
[facility] MD Progress Note  06/13/2013 2:15 PM Angel Phillips  MRN:  [id number] Subjective:  Angel Phillips endorses that he is having a hard time. States he got alcohol toxic, wanted to kill himself. States that he has had a hard time dealing wit the death of his mother 9 months ago. 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 He has not been able to finish. He has three  classes with incomplete. He got increasingly more depressed, started isolating and start drinking. He states that he wants to get better. States he now realizes he needs help but was too proud to ask for it.  Diagnosis:   DSM5: Schizophrenia Disorders:   Obsessive-Compulsive Disorders:   Trauma-Stressor Disorders:   Substance/Addictive Disorders:  Alcohol Related Disorder - Severe (303.90) Depressive Disorders:  Major Depressive Disorder - Severe (296.23)  Axis I: grief-bereavement  ADL's:  Intact  Sleep: Poor  Appetite:  Fair  Suicidal Ideation:  Plan:  denies Intent:  denies Means:  denies Homicidal Ideation:  Plan:  denies Intent:  denies Means:  denies AEB (as evidenced by):  Psychiatric Specialty Exam: Review of Systems  Constitutional: Positive for malaise/fatigue.  Eyes: Negative.   Respiratory: Negative.   Cardiovascular: Negative.   Gastrointestinal: Negative.   Genitourinary: Negative.   Musculoskeletal: Positive for back pain.  Skin: Negative.   Neurological: Positive for weakness.  Endo/Heme/Allergies: Negative.   Psychiatric/Behavioral: Positive for depression and substance abuse. The patient is nervous/anxious and has insomnia.     Blood pressure 102/64, pulse 115, temperature 99 F (37.2 C), temperature source Oral, resp. rate 20, height 5\' 6"  (1.676 m), weight 78.926 kg (174 lb), SpO2 98.00%.Body mass index is 28.1 kg/(m^2).  General Appearance: Fairly Groomed  Patent attorney::  Fair  Speech:  Clear and Coherent   Volume:  Decreased  Mood:  Anxious and Depressed  Affect:  Depressed and Tearful  Thought Process:  Coherent and Goal Directed  Orientation:  Full (Time, Place, and Person)  Thought Content:  worries, concerns, feelings associated to mother's death, anger towards her for dying  Suicidal Thoughts:  No  Homicidal Thoughts:  No  Memory:  Immediate;   Fair Recent;   Fair Remote;   Fair  Judgement:  Fair  Insight:  Present  Psychomotor Activity:  Restlessness  Concentration:  Fair  Recall:  Fair  Akathisia:  No  Handed:  Right  AIMS (if indicated):     Assets:  Desire for Improvement Housing Social Support Vocational/Educational  Sleep:  Number of Hours: 2.75   Current Medications: Current Facility-Administered Medications  Medication Dose Route Frequency Provider Last Rate Last Dose  . acetaminophen (TYLENOL) tablet 650 mg  650 mg Oral Q6H PRN Fransisca Kaufmann, NP   650 mg at 06/13/13 0507  . alum & mag hydroxide-simeth (MAALOX/MYLANTA) 200-200-20 MG/5ML suspension 30 mL  30 mL Oral Q4H PRN Fransisca Kaufmann, NP      . amLODipine (NORVASC) tablet 10 mg  10 mg Oral Daily Fransisca Kaufmann, NP   10 mg at 06/13/13 0820  . chlordiazePOXIDE (LIBRIUM) capsule 25 mg  25 mg Oral Q6H PRN Fransisca Kaufmann, NP   25 mg at 06/11/13 2305  . chlorthalidone (HYGROTON) tablet 25 mg  25 mg Oral Daily Fransisca Kaufmann, NP   25 mg at 06/13/13 4098  . diphenhydrAMINE (BENADRYL) capsule 50 mg  50 mg Oral QHS PRN,MR X 1 Rachael Fee, MD      .  gabapentin (NEURONTIN) capsule 600 mg  600 mg Oral BID Fransisca Kaufmann, NP   600 mg at 06/13/13 0820  . hydrOXYzine (ATARAX/VISTARIL) tablet 25 mg  25 mg Oral Q6H PRN Fransisca Kaufmann, NP   25 mg at 06/13/13 0330  . lidocaine (LIDODERM) 5 % 1 patch  1 patch Transdermal Q24H Fransisca Kaufmann, NP   1 patch at 06/13/13 [phone]  . lisinopril (PRINIVIL,ZESTRIL) tablet 10 mg  10 mg Oral Daily Fransisca Kaufmann, NP   10 mg at 06/13/13 0819  . loperamide (IMODIUM) capsule 2-4 mg  2-4 mg Oral PRN Fransisca Kaufmann, NP      .  magnesium hydroxide (MILK OF MAGNESIA) suspension 30 mL  30 mL Oral Daily PRN Fransisca Kaufmann, NP      . metFORMIN (GLUCOPHAGE) tablet 1,000 mg  1,000 mg Oral Q breakfast Fransisca Kaufmann, NP   1,000 mg at 06/13/13 6644  . miconazole (MICOTIN) 2 % cream   Topical BID PRN Audrea Muscat, NP   1 application at 06/12/13 1420  . multivitamin with minerals tablet 1 tablet  1 tablet Oral Daily Fransisca Kaufmann, NP   1 tablet at 06/13/13 0820  . ondansetron (ZOFRAN-ODT) disintegrating tablet 4 mg  4 mg Oral Q6H PRN Fransisca Kaufmann, NP      . sertraline (ZOLOFT) tablet 25 mg  25 mg Oral QHS Larena Sox, MD   25 mg at 06/12/13 2147  . simvastatin (ZOCOR) tablet 10 mg  10 mg Oral q1800 Fransisca Kaufmann, NP   10 mg at 06/12/13 1724  . thiamine (VITAMIN B-1) tablet 100 mg  100 mg Oral Daily Fransisca Kaufmann, NP   100 mg at 06/13/13 0347    Lab Results:  Results for orders placed during the hospital encounter of 06/11/13 (from the past 48 hour(s))  GLUCOSE, CAPILLARY     Status: None   Collection Time    06/12/13  6:05 AM      Result Value Range   Glucose-Capillary 98  70 - 99 mg/dL    Physical Findings: AIMS: Facial and Oral Movements Muscles of Facial Expression: None, normal Lips and Perioral Area: None, normal Jaw: None, normal Tongue: None, normal,Extremity Movements Upper (arms, wrists, hands, fingers): None, normal Lower (legs, knees, ankles, toes): None, normal, Trunk Movements Neck, shoulders, hips: None, normal, Overall Severity Severity of abnormal movements (highest score from questions above): None, normal Incapacitation due to abnormal movements: None, normal Patient's awareness of abnormal movements (rate only patient's report): No Awareness, Dental Status Current problems with teeth and/or dentures?: No Does patient usually wear dentures?: No  CIWA:  CIWA-Ar Total: 7 COWS:  COWS Total Score: 5  Treatment Plan Summary: Daily contact with patient to assess and evaluate symptoms and progress in  treatment Medication management  Plan: Supportive approach/coping skills/relapse prevention           Continue the detox/reassess the co morbidities           CBT/mindfulness/ Grief-loss           Consult chaplain Medical Decision Making Problem Points:  Review of psycho-social stressors (1) Data Points:  Review of medication regiment & side effects (2)  I certify that inpatient services furnished can reasonably be expected to improve the patient's condition.   Daryll Spisak A 06/13/2013, 2:15 PM

## 2013-06-13 NOTE — Progress Notes (Signed)
D: Patient in his room on approach.  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  Patient states he has been gaining knowledge while being here.  Patient states he has learned coping skills.  Patient states he has to stop drinking.  Patient denies SI/HI and denies AVH.   A: Staff to monitor Q 15 mins for safety.  Encouragement and support offered.  Scheduled medications administered per orders. R: Patient remains safe on the unit.  Patient attended group tonight.  Patient taking administered medications.  Patient taking administered medications.

## 2013-06-13 NOTE — Progress Notes (Signed)
Patient did attend the evening speaker AA meeting.  

## 2013-06-13 NOTE — Tx Team (Signed)
Interdisciplinary Treatment Plan Update (Adult)  Date: 06/13/2013   Time Reviewed: 11:06 AM  Progress in Treatment:  Attending groups: Yes Participating in groups: Yes  Taking medication as prescribed: Yes  Tolerating medication: Yes  Family/Significant othe contact made: No, SPE not required for this pt.  Patient understands diagnosis: Yes, AEB seeking treatment for ETOH detox, and depression.  Discussing patient identified problems/goals with staff: Yes  Medical problems stabilized or resolved: Yes  Denies suicidal/homicidal ideation: Yes  Patient has not harmed self or Others: Yes  New problem(s) identified: Trazodone causing problems and not helping with sleep according to pt. Pt asked to be taken off and switched to different sleep aid.  Discharge Plan or Barriers: Pt interested in [facility]. He reports being set up with [facility] but unalbe to get appt scheduled with psychiatrist/therapist. CSW assessing for appropriate referrals. Additional comments: Angel Phillips is a 52 year old divorced African American male who presented to the emergency department with complaints of depression and excessive alcohol consumption. He reports that he drank 13 bottles of liquor, 13 40-ounce bottles of beer, and 2 bottles of wine over a 6 day period. Prior to this then she, he had 55 days sober going to [employer] He reports that the death of his mother 9 months ago, and a falling out with his girlfriend have sent him into a spiral of depression. He reports that he drinks to treat his depression. He denies any suicidal or homicidal ideation, or auditory or visual hallucinations, but he does endorse significant ruminating thoughts and severe anxiety. He reports that he has been quick tempered and demanding. Reason for Continuation of Hospitalization: Withdrawals Medication management Mood stabilization Estimated length of stay: 3-5 days For review of initial/current patient goals, please see plan of care.   Attendees:  Patient:    Family:    Physician: Geoffery Lyons MD 06/13/2013 11:05 AM   Nursing: Lupita Leash RN  06/13/2013 11:05 AM   Clinical Social Worker Onesimo Lingard Smart, LCSWA  06/13/2013 11:05 AM   Other: Sue Lush PA  06/13/2013 11:05 AM   Other: Darden Dates Nurse CM 06/13/2013 11:05 AM   Other: Massie Kluver, Community Care Coordinator  06/13/2013 11:05 AM  Aggie PA 06/13/2013 11:05 AM   Scribe for Treatment Team:  Herbert Seta Smart LCSWA  06/13/2013 11:06 AM

## 2013-06-14 DIAGNOSIS — F4321 Adjustment disorder with depressed mood: Secondary | ICD-10-CM | POA: Diagnosis not present

## 2013-06-14 DIAGNOSIS — Z634 Disappearance and death of family member: Secondary | ICD-10-CM | POA: Diagnosis not present

## 2013-06-14 LAB — GLUCOSE, CAPILLARY
Glucose-Capillary: 131 mg/dL — ABNORMAL HIGH (ref 70–99)
Glucose-Capillary: 165 mg/dL — ABNORMAL HIGH (ref 70–99)
Glucose-Capillary: 104 mg/dL — ABNORMAL HIGH (ref 70–99)
Glucose-Capillary: 48 mg/dL — ABNORMAL LOW (ref 70–99)

## 2013-06-14 MED ORDER — INSULIN ASPART 100 UNIT/ML ~~LOC~~ SOLN
0.0000 [IU] | Freq: Three times a day (TID) | SUBCUTANEOUS | Status: DC
  Administered 2013-06-14: 3 [IU] via SUBCUTANEOUS
  Administered 2013-06-14: 2 [IU] via SUBCUTANEOUS

## 2013-06-14 MED ORDER — INSULIN ASPART 100 UNIT/ML ~~LOC~~ SOLN
4.0000 [IU] | Freq: Three times a day (TID) | SUBCUTANEOUS | Status: DC
  Administered 2013-06-14 (×2): 4 [IU] via SUBCUTANEOUS

## 2013-06-14 MED ORDER — METFORMIN HCL 500 MG PO TABS
1000.0000 mg | ORAL_TABLET | Freq: Two times a day (BID) | ORAL | Status: DC
  Administered 2013-06-14 – 2013-06-15 (×2): 1000 mg via ORAL
  Filled 2013-06-14 (×4): qty 2

## 2013-06-14 MED ORDER — CYCLOBENZAPRINE HCL 10 MG PO TABS
5.0000 mg | ORAL_TABLET | Freq: Two times a day (BID) | ORAL | Status: DC
  Administered 2013-06-14 – 2013-06-15 (×2): 5 mg via ORAL
  Filled 2013-06-14 (×4): qty 0.5

## 2013-06-14 NOTE — Progress Notes (Signed)
D:  Patient up and active in the milieu this shift.  Has attended and participated in all groups.  He is tolerating his medications well.  He denies suicidal thoughts.  He reports that his appetite is improving and his depression is lessening every day.   A:  Medications given as prescribed.  Offered support and encouragement.   R:  Cooperative on the unit.  Interacting well with staff and peers.  Affect brighter.

## 2013-06-14 NOTE — Progress Notes (Addendum)
Recreation Therapy Notes  Date: 09.16.2014 Time: 2:30pm Location: [address] Dayroom  Group Topic: Software engineer Activities (AAA)  Behavioral Response: Engaged, Approrpaite  Affect: Euthymic to bright at times.   Clinical Observations/Feedback: Dog Team: [employer] Patient interacted appropriately with peer, dog team, LRT and MHT.   Marykay Lex Garielle Mroz, LRT/CTRS  Jearl Klinefelter 06/14/2013 4:33 PM

## 2013-06-14 NOTE — Progress Notes (Signed)
Adult Psychoeducational Group Note  Date:  06/14/2013 Time:  1:35 PM  Group Topic/Focus:  Recovery Goals:   The focus of this group is to identify appropriate goals for recovery and establish a plan to achieve them.  Participation Level:  Active  Participation Quality:  Appropriate and Attentive  Affect:  Appropriate  Cognitive:  Alert and Appropriate  Insight: Good  Engagement in Group:  Engaged  Modes of Intervention:  Discussion, Socialization and Support  Additional Comments:  Pt came to group and shared that isolation and depression were standing between him and recovery. Pt plans on changing this by volunteering, going to [employer], and going to [employer]. Pt plans on changing his depression by going to grief counseling and going to therapy.   Cathlean Cower 06/14/2013, 1:35 PM

## 2013-06-14 NOTE — Progress Notes (Signed)
D: Patient in his room on approach.  Patient states he had a good day.  Patient states he was upset today because the doctor ordered insulin for him and he states it made his blood sugar drop drastically.  Patient also states he is elevating his feet tonight because he states he noticed his lower extremities have been swelling.  Patient denies SI/HI and denies AVH A: Staff to monitor Q 15 mins for safety.  Encouragement and support offered.  Scheduled medications administered per orders. R: Patient remains safe on the unit.  Patient taking administered medications.  Patient visible on the unit and interacting with peers.

## 2013-06-14 NOTE — Progress Notes (Signed)
The focus of this group is to educate the patient on the purpose and policies of crisis stabilization and provide a format to answer questions about their admission.  The group details unit policies and expectations of patients while admitted.  Patient attended and actively participated in the group this morning.  He was interactive and respectful of others in the group.

## 2013-06-14 NOTE — [facility] Group Notes (Signed)
[facility] LCSW Group Therapy  06/14/2013 3:39 PM  Type of Therapy:  Group Therapy  Participation Level:  Active  Participation Quality:  Attentive  Affect:  Appropriate  Cognitive:  Alert  Insight:  Engaged  Engagement in Therapy:  Engaged  Modes of Intervention:  Discussion, Education and Support  Summary of Progress/Problems: MHA Speaker came to talk about his personal journey with substance abuse and addiction. The pt processed ways by which to relate to the speaker. MHA speaker provided handouts and educational information pertaining to groups and services offered by the [facility]. Pt was attentive and engaged throughout the day's group. He listened as speaker talked about his personal story and reviewed groups offered by [facility]. Ruari thanked the speaker after group ended.    Smart, Cheetara Hoge 06/14/2013, 3:39 PM

## 2013-06-14 NOTE — Progress Notes (Signed)
Patient ID: Angel Phillips, male   DOB: 1960-12-05, 52 y.o.   MRN: [id number] [facility] MD Progress Note  06/14/2013 1:15 PM Angel Phillips  MRN:  [id number]  Subjective:  Demarus endorses that his detox treatment is going well. However says that he is still dealing with a lot of grief related to the death of his mother 9 months ago. He has met with the chaplin who gave him some tools to work with when dealing with his mother's death. He adds that he has shot temper and is currently learning ways to have some control over his emotions. He rates his depression at #6 and anxiety at #5. He complains of muscle spasms, which keeps him up at night. Declines to have Trazodone for sleep, makes him drowsy during the day.  Diagnosis:   DSM5: Schizophrenia Disorders:   Obsessive-Compulsive Disorders:   Trauma-Stressor Disorders:   Substance/Addictive Disorders:  Alcohol Related Disorder - Severe (303.90) Depressive Disorders:  Major Depressive Disorder - Severe (296.23)  Axis I: grief-bereavement  ADL's:  Intact  Sleep: Poor  Appetite:  Fair  Suicidal Ideation:  Plan:  denies Intent:  denies Means:  denies  Homicidal Ideation:  Plan:  denies Intent:  denies Means:  denies  AEB (as evidenced by): per patient's reports.  Psychiatric Specialty Exam: Review of Systems  Constitutional: Positive for malaise/fatigue.  Eyes: Negative.   Respiratory: Negative.   Cardiovascular: Negative.   Gastrointestinal: Negative.   Genitourinary: Negative.   Musculoskeletal: Positive for back pain.  Skin: Negative.   Neurological: Positive for weakness.  Endo/Heme/Allergies: Negative.   Psychiatric/Behavioral: Positive for depression and substance abuse. The patient is nervous/anxious and has insomnia.     Blood pressure 131/86, pulse 103, temperature 98.8 F (37.1 C), temperature source Oral, resp. rate 18, height 5\' 6"  (1.676 m), weight 78.926 kg (174 lb), SpO2 98.00%.Body mass index is 28.1 kg/(m^2).   General Appearance: Fairly Groomed,   Patent attorney::  Fair  Speech:  Clear and Coherent  Volume:  Decreased  Mood:  Anxious and Depressed  Affect:  Depressed and Tearful  Thought Process:  Coherent and Goal Directed  Orientation:  Full (Time, Place, and Person)  Thought Content:  worries, concerns, feelings associated to mother's death, anger towards her for dying  Suicidal Thoughts:  No  Homicidal Thoughts:  No  Memory:  Immediate;   Fair Recent;   Fair Remote;   Fair  Judgement:  Fair  Insight:  Present  Psychomotor Activity:  Restlessness  Concentration:  Fair  Recall:  Fair  Akathisia:  No  Handed:  Right  AIMS (if indicated):     Assets:  Desire for Improvement Housing Social Support Vocational/Educational  Sleep:  Number of Hours: 5   Current Medications: Current Facility-Administered Medications  Medication Dose Route Frequency Provider Last Rate Last Dose  . acetaminophen (TYLENOL) tablet 650 mg  650 mg Oral Q6H PRN Fransisca Kaufmann, NP   650 mg at 06/14/13 0830  . alum & mag hydroxide-simeth (MAALOX/MYLANTA) 200-200-20 MG/5ML suspension 30 mL  30 mL Oral Q4H PRN Fransisca Kaufmann, NP      . amLODipine (NORVASC) tablet 10 mg  10 mg Oral Daily Fransisca Kaufmann, NP   10 mg at 06/14/13 0828  . chlordiazePOXIDE (LIBRIUM) capsule 25 mg  25 mg Oral Q6H PRN Fransisca Kaufmann, NP   25 mg at 06/11/13 2305  . chlorthalidone (HYGROTON) tablet 25 mg  25 mg Oral Daily Fransisca Kaufmann, NP   25 mg at 06/14/13 0827  .  diphenhydrAMINE (BENADRYL) capsule 50 mg  50 mg Oral QHS PRN,MR X 1 Rachael Fee, MD   50 mg at 06/13/13 2126  . gabapentin (NEURONTIN) capsule 600 mg  600 mg Oral BID Fransisca Kaufmann, NP   600 mg at 06/14/13 0827  . hydrOXYzine (ATARAX/VISTARIL) tablet 25 mg  25 mg Oral Q6H PRN Fransisca Kaufmann, NP   25 mg at 06/13/13 0330  . insulin aspart (novoLOG) injection 0-15 Units  0-15 Units Subcutaneous TID WC Sanjuana Kava, NP   2 Units at 06/14/13 1152  . insulin aspart (novoLOG) injection 4 Units  4 Units  Subcutaneous TID WC Sanjuana Kava, NP   4 Units at 06/14/13 1154  . lidocaine (LIDODERM) 5 % 1 patch  1 patch Transdermal Q24H Fransisca Kaufmann, NP   1 patch at 06/14/13 0957  . lisinopril (PRINIVIL,ZESTRIL) tablet 10 mg  10 mg Oral Daily Fransisca Kaufmann, NP   10 mg at 06/14/13 7846  . loperamide (IMODIUM) capsule 2-4 mg  2-4 mg Oral PRN Fransisca Kaufmann, NP      . magnesium hydroxide (MILK OF MAGNESIA) suspension 30 mL  30 mL Oral Daily PRN Fransisca Kaufmann, NP      . metFORMIN (GLUCOPHAGE) tablet 1,000 mg  1,000 mg Oral Q breakfast Fransisca Kaufmann, NP   1,000 mg at 06/14/13 9629  . miconazole (MICOTIN) 2 % cream   Topical BID PRN Audrea Muscat, NP   1 application at 06/12/13 1420  . multivitamin with minerals tablet 1 tablet  1 tablet Oral Daily Fransisca Kaufmann, NP   1 tablet at 06/14/13 0827  . ondansetron (ZOFRAN-ODT) disintegrating tablet 4 mg  4 mg Oral Q6H PRN Fransisca Kaufmann, NP      . sertraline (ZOLOFT) tablet 25 mg  25 mg Oral QHS Larena Sox, MD   25 mg at 06/13/13 2126  . simvastatin (ZOCOR) tablet 10 mg  10 mg Oral q1800 Fransisca Kaufmann, NP   10 mg at 06/13/13 1702  . thiamine (VITAMIN B-1) tablet 100 mg  100 mg Oral Daily Fransisca Kaufmann, NP   100 mg at 06/14/13 5284    Lab Results:  Results for orders placed during the hospital encounter of 06/11/13 (from the past 48 hour(s))  GLUCOSE, CAPILLARY     Status: Abnormal   Collection Time    06/13/13  8:03 PM      Result Value Range   Glucose-Capillary 119 (*) 70 - 99 mg/dL  GLUCOSE, CAPILLARY     Status: Abnormal   Collection Time    06/14/13  6:20 AM      Result Value Range   Glucose-Capillary 131 (*) 70 - 99 mg/dL  GLUCOSE, CAPILLARY     Status: Abnormal   Collection Time    06/14/13 11:38 AM      Result Value Range   Glucose-Capillary 141 (*) 70 - 99 mg/dL    Physical Findings: AIMS: Facial and Oral Movements Muscles of Facial Expression: None, normal Lips and Perioral Area: None, normal Jaw: None, normal Tongue: None, normal,Extremity  Movements Upper (arms, wrists, hands, fingers): None, normal Lower (legs, knees, ankles, toes): None, normal, Trunk Movements Neck, shoulders, hips: None, normal, Overall Severity Severity of abnormal movements (highest score from questions above): None, normal Incapacitation due to abnormal movements: None, normal Patient's awareness of abnormal movements (rate only patient's report): No Awareness, Dental Status Current problems with teeth and/or dentures?: No Does patient usually wear dentures?: No  CIWA:  CIWA-Ar Total: 7 COWS:  COWS Total Score: 5  Treatment Plan Summary: Daily contact with patient to assess and evaluate symptoms and progress in treatment Medication management  Plan: Supportive approach/coping skills/relapse prevention. Initiate Flexeril 10 bid for muscle spasms. Continue the detox/reassess the co morbidities CBT/mindfulness/ Grief-loss Consulted chaplain- patient given tools to work with grief.  Medical Decision Making Problem Points:  Review of psycho-social stressors (1) Data Points:  Review of medication regiment & side effects (2)  I certify that inpatient services furnished can reasonably be expected to improve the patient's condition.   Armandina Stammer I, PMHNP-BC 06/14/2013, 1:15 PM

## 2013-06-14 NOTE — Progress Notes (Signed)
Patient ID: Angel Phillips, male   DOB: 07-Mar-1961, 52 y.o.   MRN: [id number] PER STATE REGULATIONS 482.30  THIS CHART WAS REVIEWED FOR MEDICAL NECESSITY WITH RESPECT TO THE PATIENT'S ADMISSION/ DURATION OF STAY.  NEXT REVIEW DATE: 06/18/2013  Willa Rough, RN, BSN CASE MANAGER

## 2013-06-14 NOTE — Progress Notes (Signed)
Recreation Therapy Notes  Date: 09.15.2014 Time: 3:00pm Location: [address] Dayroom  Group Topic: Coping Skills  Goal Area(s) Addresses:  Patient will verbalize importance of recognizing emotions. Patient will identify at least one emotion. Patient will successfully represent varying emotions in pictures or words.   Behavioral Response: Appropriate, Engaged, Attentive  Intervention: Art  Activity: Emotion Wheel. As a group patients identified 8 emotions they associate with recovery. Using the provided worksheet patients were asked to represent emotions identified by group in pictures of words.    Education: Emotional Recognition, Emotional Regulation, Coping Skills  Education Outcome: Acknowledges Understanding & In group clarification offered.    Clinical Observations/Feedback: Patient actively participated in group session, identifying and defining emotions with group members. Emotions ranged from anxious to happy and frustrated, as well as grace and humility. Patient used pictures to represent each emotion. Patient identified mediation as a coping mechanism he can use when he experiences stress, anxiety, frustration. Patient verbalized understanding of importance of recognizing emotions prior to substance abuse.  Marykay Lex Angla Delahunt, LRT/CTRS  Jearl Klinefelter 06/14/2013 4:58 PM

## 2013-06-15 DIAGNOSIS — F1020 Alcohol dependence, uncomplicated: Secondary | ICD-10-CM

## 2013-06-15 DIAGNOSIS — F10239 Alcohol dependence with withdrawal, unspecified: Principal | ICD-10-CM

## 2013-06-15 DIAGNOSIS — F321 Major depressive disorder, single episode, moderate: Secondary | ICD-10-CM

## 2013-06-15 LAB — GLUCOSE, CAPILLARY: Glucose-Capillary: 91 mg/dL (ref 70–99)

## 2013-06-15 MED ORDER — SERTRALINE HCL 25 MG PO TABS: 25.0000 mg | ORAL_TABLET | Freq: Every day | ORAL | Status: DC

## 2013-06-15 MED ORDER — LISINOPRIL 20 MG PO TABS: 10.0000 mg | ORAL_TABLET | Freq: Every day | ORAL | Status: DC

## 2013-06-15 MED ORDER — SIMVASTATIN 10 MG PO TABS: 10.0000 mg | ORAL_TABLET | Freq: Every day | ORAL | Status: DC

## 2013-06-15 MED ORDER — METFORMIN HCL 1000 MG PO TABS: 1000.0000 mg | ORAL_TABLET | Freq: Two times a day (BID) | ORAL | Status: DC

## 2013-06-15 MED ORDER — CYCLOBENZAPRINE HCL 5 MG PO TABS: 5.0000 mg | ORAL_TABLET | Freq: Two times a day (BID) | ORAL | Status: DC

## 2013-06-15 MED ORDER — CHLORTHALIDONE 25 MG PO TABS: 25.0000 mg | ORAL_TABLET | Freq: Every day | ORAL | Status: DC

## 2013-06-15 MED ORDER — AMLODIPINE BESYLATE 10 MG PO TABS: 10.0000 mg | ORAL_TABLET | Freq: Every day | ORAL | Status: DC

## 2013-06-15 MED ORDER — GABAPENTIN 300 MG PO CAPS: 600.0000 mg | ORAL_CAPSULE | Freq: Two times a day (BID) | ORAL | Status: DC

## 2013-06-15 MED ORDER — LIDOCAINE 5 % EX PTCH: 1.0000 | MEDICATED_PATCH | CUTANEOUS | Status: DC

## 2013-06-15 NOTE — Progress Notes (Signed)
Pt d/c from hospital to lobby. Pt called a cab. All items returned. D/c instructions given and prescriptions given. Pt denies si and hi.

## 2013-06-15 NOTE — [facility] Suicide Risk Assessment (Signed)
Suicide Risk Assessment  Discharge Assessment     Demographic Factors:  Male  Mental Status Per Nursing Assessment::   On Admission:     Current Mental Status by Physician: In full contact with reality. There are no suicidal ideas, plans or intent. His mood is euthymic. His affect is appropriate. There are no suicidal ideas, plans or intent.. He states he was broken when he came in but now he feels renewed, validated as far as his feelings. He is looking forward to pursue continue his journey. He plans to work on his grief trough a church support group.   Loss Factors: Loss of significant relationship  Historical Factors: NA  Risk Reduction Factors:   Religious beliefs about death and Positive social support  Continued Clinical Symptoms:  Depression:   Comorbid alcohol abuse/dependence Alcohol/Substance Abuse/Dependencies  Cognitive Features That Contribute To Risk:  Polarized thinking Thought constriction (tunnel vision)    Suicide Risk:  Minimal: No identifiable suicidal ideation.  Patients presenting with no risk factors but with morbid ruminations; may be classified as minimal risk based on the severity of the depressive symptoms  Discharge Diagnoses:   AXIS I:  Alcohol dependence, Major depression AXIS II:  Deferred AXIS III:   Past Medical History  Diagnosis Date  . Hypertension   . Diabetes mellitus without complication   . Arthritis   . Tuberculosis     tb positive 1989 ?   Marland Kitchen Depression   . PONV (postoperative nausea and vomiting)   . Chronic kidney disease 02/2013    ACUTE RENAL FAILURE   AXIS IV:  other psychosocial or environmental problems AXIS V:  61-70 mild symptoms  Plan Of Care/Follow-up recommendations:  Activity:  as tolerated Diet:  regular Follow up outpatient basis Is patient on multiple antipsychotic therapies at discharge:  No   Has Patient had three or more failed trials of antipsychotic monotherapy by history:  No  Recommended Plan  for Multiple Antipsychotic Therapies: NA  Davonne Jarnigan A 06/15/2013, 12:35 PM

## 2013-06-15 NOTE — Discharge Summary (Signed)
Physician Discharge Summary Note  Patient:  Angel Phillips is an 52 y.o., male MRN:  [id number] DOB:  [birth year] Patient phone:  [phone] (home)  Patient address:   [address] [place] [address],   Date of Admission:  06/11/2013 Date of Discharge: 06/14/13  Reason for Admission: Alcohol detox  Discharge Diagnoses: Active Problems:   Depression, major   Alcohol dependence   Complicated bereavement  Review of Systems  Constitutional: Negative.   HENT: Negative.   Eyes: Negative.   Respiratory: Negative.   Cardiovascular: Negative.   Gastrointestinal: Negative.   Genitourinary: Negative.   Musculoskeletal: Negative.   Skin: Negative.   Neurological: Negative.   Endo/Heme/Allergies: Negative.   Psychiatric/Behavioral: Positive for depression (Stabilized with medication prior to discharge) and substance abuse (Alcoholism). Negative for suicidal ideas, hallucinations and memory loss. The patient is nervous/anxious (Stabilized with medication prior to discharge) and has insomnia (Stabilized with medication.).     DSM5:  Schizophrenia Disorders:  NA Obsessive-Compulsive Disorders:  NA Trauma-Stressor Disorders:  Complicated bereavement  Substance/Addictive Disorders:  Alcohol Withdrawal (291.81), Alcohol dependence Depressive Disorders:  Major Depressive Disorder - Moderate (296.22)  Axis Diagnosis:   AXIS I:  Alcohol Withdrawal (291.81), Alcohol dependence, Major dpressive disorder, moderate AXIS II:  Deferred AXIS III:   Past Medical History  Diagnosis Date  . Hypertension   . Diabetes mellitus without complication   . Arthritis   . Tuberculosis     tb positive 1989 ?   Marland Kitchen Depression   . PONV (postoperative nausea and vomiting)   . Chronic kidney disease 02/2013    ACUTE RENAL FAILURE   AXIS IV:  other psychosocial or environmental problems and Alcoholism, chronic mental illness AXIS V:  63  Level of Care:  OP  Hospital Course: Angel Phillips is a  52 year old divorced African American male who presented to the emergency department with complaints of depression and excessive alcohol consumption. He reports that he drank 13 bottles of liquor, 13 40-ounce bottles of beer, and 2 bottles of wine over a 6 day period. Prior to this then she, he had 55 days sober going to [employer] He reports that the death of his mother 9 months ago, and a falling out with his girlfriend have sent him into a spiral of depression. He reports that he drinks to treat his depression. He denies any suicidal or homicidal ideation, or auditory or visual hallucinations, but he does endorse significant ruminating thoughts and severe anxiety. He reports that he has been quick tempered and demanding.  While a patient in this hospital and after admission assessment/evaluation, it was determined based on patient's symptoms that he will need medication management to stabilize his current depressive mood symptoms. Although he has history of cocaine and alcohol dependency, his UDS/toxicology reports indicated no positive result of any of such substances, contrary to his reports of excessive alcohol consumption. And because of the above mentioned reasons, Angel Phillips did not receive any detoxification treatment protocols. However, he did receive Librium 25 mg capsules on a prn doses to combat any anxiety related symptoms that he may experience during his stay here. He was ordered and received Setraline 25 mg daily for depression and Neurontin 600 mg twice daily for anxiety/pain management.  Patient did respond positively to his treatment regimen. This is evidenced by his daily reports of gradual mood improvement, reduction of symptoms and presentation of good affect/eye contact. He attended treatment team meeting this am and met with his treatment team members. His reason  for admission, present symptoms, response to treatment and discharge plans discussed with patient. Angel Phillips endorsed  that his symptoms has stabilized and that he is ready for discharge to pursue psychiatric care on outpatient basis. It was then agreed upon that patient will follow-up care at [facility] here in [place], [place] between on 06/20/13 at 2:00 PM and at [facility] on 07/08/13 at 11:30 am.The addresses, dates, times and contact information for these clinic provided for patient in writing.  Upon discharge, Angel Phillips adamantly denies any suicidal, homicidal ideations, auditory, visual hallucinations, paranoia, withdrawal symptoms and or delusional thoughts. He was provided with 4 days worth supply samples of his [facility] discharge medications. He left [facility] with all personal belongings via a taxi cab transport in no apparent distress.  Consults:  psychiatry  Significant Diagnostic Studies:  labs: CBC with diff, CMP, UDS, toxicology tests, U/A  Discharge Vitals:   Blood pressure 163/99, pulse 94, temperature 98.8 F (37.1 C), temperature source Oral, resp. rate 18, height 5\' 6"  (1.676 m), weight 78.926 kg (174 lb), SpO2 98.00%. Body mass index is 28.1 kg/(m^2). Lab Results:   Results for orders placed during the hospital encounter of 06/11/13 (from the past 72 hour(s))  GLUCOSE, CAPILLARY     Status: Abnormal   Collection Time    06/13/13  8:03 PM      Result Value Range   Glucose-Capillary 119 (*) 70 - 99 mg/dL  GLUCOSE, CAPILLARY     Status: Abnormal   Collection Time    06/14/13  6:20 AM      Result Value Range   Glucose-Capillary 131 (*) 70 - 99 mg/dL  GLUCOSE, CAPILLARY     Status: Abnormal   Collection Time    06/14/13 11:38 AM      Result Value Range   Glucose-Capillary 141 (*) 70 - 99 mg/dL  GLUCOSE, CAPILLARY     Status: Abnormal   Collection Time    06/14/13  4:45 PM      Result Value Range   Glucose-Capillary 165 (*) 70 - 99 mg/dL  GLUCOSE, CAPILLARY     Status: Abnormal   Collection Time    06/14/13  6:56 PM      Result Value Range    Glucose-Capillary 48 (*) 70 - 99 mg/dL  GLUCOSE, CAPILLARY     Status: Abnormal   Collection Time    06/14/13  7:18 PM      Result Value Range   Glucose-Capillary 104 (*) 70 - 99 mg/dL  GLUCOSE, CAPILLARY     Status: Abnormal   Collection Time    06/14/13  8:50 PM      Result Value Range   Glucose-Capillary 103 (*) 70 - 99 mg/dL  GLUCOSE, CAPILLARY     Status: None   Collection Time    06/15/13  6:09 AM      Result Value Range   Glucose-Capillary 91  70 - 99 mg/dL    Physical Findings: AIMS: Facial and Oral Movements Muscles of Facial Expression: None, normal Lips and Perioral Area: None, normal Jaw: None, normal Tongue: None, normal,Extremity Movements Upper (arms, wrists, hands, fingers): None, normal Lower (legs, knees, ankles, toes): None, normal, Trunk Movements Neck, shoulders, hips: None, normal, Overall Severity Severity of abnormal movements (highest score from questions above): None, normal Incapacitation due to abnormal movements: None, normal Patient's awareness of abnormal movements (rate only patient's report): No Awareness, Dental Status Current problems with teeth and/or dentures?: No Does patient  usually wear dentures?: No  CIWA:  CIWA-Ar Total: 7 COWS:  COWS Total Score: 5  Psychiatric Specialty Exam: See Psychiatric Specialty Exam and Suicide Risk Assessment completed by Attending Physician prior to discharge.  Discharge destination:  Home  Is patient on multiple antipsychotic therapies at discharge:  No   Has Patient had three or more failed trials of antipsychotic monotherapy by history:  No  Recommended Plan for Multiple Antipsychotic Therapies: NA     Medication List    STOP taking these medications       alprostadil 1000 MCG pellet  Commonly known as:  MUSE     gabapentin 600 MG tablet  Commonly known as:  NEURONTIN  Replaced by:  gabapentin 300 MG capsule     VITAMIN B COMPLEX IJ      TAKE these medications     Indication    amLODipine 10 MG tablet  Commonly known as:  NORVASC  Take 1 tablet (10 mg total) by mouth daily. For high blood pressure control   Indication:  High Blood Pressure     chlorthalidone 25 MG tablet  Commonly known as:  HYGROTON  Take 1 tablet (25 mg total) by mouth daily. For control of HTN/diabetes mellitus   Indication:  Diabetes Insipidus, High Blood Pressure     cyclobenzaprine 5 MG tablet  Commonly known as:  FLEXERIL  Take 1 tablet (5 mg total) by mouth 2 (two) times daily. For muscle spasm   Indication:  Muscle Spasm     gabapentin 300 MG capsule  Commonly known as:  NEURONTIN  Take 2 capsules (600 mg total) by mouth 2 (two) times daily. For pain/anxiety   Indication:  Pain, Anxiety     lidocaine 5 %  Commonly known as:  LIDODERM  Place 1 patch onto the skin daily. Remove & Discard patch within 12 hours or as directed by MD: For pain management   Indication:  Nerve Pain After Herpes Zoster or Shingles     lisinopril 20 MG tablet  Commonly known as:  PRINIVIL,ZESTRIL  Take 0.5 tablets (10 mg total) by mouth daily. For high blood pressure control   Indication:  High Blood Pressure     metFORMIN 1000 MG tablet  Commonly known as:  GLUCOPHAGE  Take 1 tablet (1,000 mg total) by mouth 2 (two) times daily with a meal. For diabetes management   Indication:  Type 2 Diabetes     sertraline 25 MG tablet  Commonly known as:  ZOLOFT  Take 1 tablet (25 mg total) by mouth at bedtime. For depression   Indication:  Major Depressive Disorder     simvastatin 10 MG tablet  Commonly known as:  ZOCOR  Take 1 tablet (10 mg total) by mouth daily at 6 PM. For high cholesterol control   Indication:  Inherited Homozygous Hypercholesterolemia, Nonfamilial Heterozygous Hypercholesterolemia       Follow-up Information   Follow up with [facility] On 07/08/2013. (Appt. at 11:30AM for medication management with Dr. Stephannie Peters)    Contact information:   [address] [place], [place] [address]  Phone: [phone] Fax: [phone]      Follow up with [facility] On 06/20/2013. (Appt. at [facility] for therapy with Lorelee Market. Be sure to make this appt or call within 48 hours to cancel/reschedule. )    Contact information:   [facility] [address] [place] [address] Phone: [phone] Fax: [phone]     Follow-up recommendations: Activity:  As tolerated  Diet: As recommended by your primary care doctor. Keep all scheduled follow-up appointments as recommended.  Continue to work your relapse prevention plan Comments:  Take all your medications as prescribed by your mental healthcare provider. Report any adverse effects and or reactions from your medicines to your outpatient provider promptly. Patient is instructed and cautioned to not engage in alcohol and or illegal drug use while on prescription medicines. In the event of worsening symptoms, patient is instructed to call the crisis hotline, 911 and or go to the nearest ED for appropriate evaluation and treatment of symptoms. Follow-up with your primary care provider for your other medical issues, concerns and or health care needs.   Total Discharge Time:  Greater than 30 minutes.  Signed: Sanjuana Kava, PMHNP-BC 06/15/2013, 9:35 AM Agree with assessment and plan Reymundo Poll. Dub Mikes, M.D.

## 2013-06-15 NOTE — BHH Group Notes (Signed)
[facility] LCSW Aftercare Discharge Planning Group Note   06/15/2013 9:19 AM  Participation Quality:  Appropriate   Mood/Affect:  Appropriate  Depression Rating:  1  Anxiety Rating:  1  Thoughts of Suicide:  No Will you contract for safety?   NA  Current AVH:  No  Plan for Discharge/Comments:  Pt reports that he is happy to be d/cing last night. He stated that he will be going to [facility] for the rest of this week in order to spend time with his family. He will come back to [facility] on Sunday in order to begin grief counseling Sun evening. Pt has therapy appt on Monday and med management followup at [facility] Oct 10th.   Transportation Means: Cab  Supports: family  Smart, Research scientist (physical sciences)

## 2013-06-15 NOTE — Progress Notes (Signed)
[facility] Adult Case Management Discharge Plan :  Will you be returning to the same living situation after discharge: Yes,  home At discharge, do you have transportation home?:Yes,  cab Do you have the ability to pay for your medications:Yes,  Medicare  Release of information consent forms completed and in the chart;  Patient's signature needed at discharge.  Patient to Follow up at: Follow-up Information   Follow up with [facility] On 07/08/2013. (Appt. at 11:30AM for medication management with Dr. Stephannie Peters)    Contact information:   [address] [place], [place] [address] Phone: [phone] Fax: [phone]      Follow up with [facility] On 06/20/2013. (Appt. at [facility] for therapy with Lorelee Market. Be sure to make this appt or call within 48 hours to cancel/reschedule. )    Contact information:   [facility] [address], [place] [address] Phone: [phone] Fax: [phone]      Patient denies SI/HI:   Yes,  during admission, group, and self report.    Safety Planning and Suicide Prevention discussed:  Yes,  SPE not required for this pt. He was provided with SPI pamphlet and encourage to share this information with his support network.  Smart, Tashawnda Bleiler 06/15/2013, 10:35 AM

## 2013-06-15 NOTE — ED Provider Notes (Signed)
Medical screening examination/treatment/procedure(s) were performed by non-physician practitioner and as supervising physician I was immediately available for consultation/collaboration.   Kaelin Bonelli, MD 06/15/13 2250 

## 2013-06-20 NOTE — Progress Notes (Signed)
Patient Discharge Instructions:  After Visit Summary (AVS):   Faxed to:  06/20/13 Discharge Summary Note:   Faxed to:  06/20/13 Psychiatric Admission Assessment Note:   Faxed to:  06/20/13 Suicide Risk Assessment - Discharge Assessment:   Faxed to:  06/20/13 Faxed/Sent to the Next Level Care provider:  06/20/13 Faxed to [facility] [place] @ [phone] Faxed to [facility] @ [phone]  Jerelene Redden, 06/20/2013, 1:40 PM

## 2014-09-19 ENCOUNTER — Ambulatory Visit: Payer: Medicare Other | Admitting: Physical Therapy

## 2014-10-25 ENCOUNTER — Other Ambulatory Visit: Payer: Self-pay | Admitting: Family Medicine

## 2014-10-25 DIAGNOSIS — M25561 Pain in right knee: Secondary | ICD-10-CM

## 2014-10-25 DIAGNOSIS — M25562 Pain in left knee: Principal | ICD-10-CM

## 2014-10-31 ENCOUNTER — Ambulatory Visit
Admission: RE | Admit: 2014-10-31 | Discharge: 2014-10-31 | Disposition: A | Discharge status: Home / Self Care | Payer: Non-veteran care | Source: Ambulatory Visit | Attending: Family Medicine | Admitting: Family Medicine

## 2014-10-31 DIAGNOSIS — M25561 Pain in right knee: Secondary | ICD-10-CM

## 2014-10-31 DIAGNOSIS — M25562 Pain in left knee: Principal | ICD-10-CM

## 2014-11-02 DIAGNOSIS — M170 Bilateral primary osteoarthritis of knee: Secondary | ICD-10-CM | POA: Diagnosis not present

## 2014-11-10 ENCOUNTER — Other Ambulatory Visit: Payer: Self-pay | Admitting: Orthopedic Surgery

## 2014-11-15 DIAGNOSIS — M25561 Pain in right knee: Secondary | ICD-10-CM | POA: Diagnosis not present

## 2014-11-15 DIAGNOSIS — M1712 Unilateral primary osteoarthritis, left knee: Secondary | ICD-10-CM | POA: Diagnosis not present

## 2014-11-21 ENCOUNTER — Emergency Department (HOSPITAL_COMMUNITY)
Admission: EM | Admit: 2014-11-21 | Discharge: 2014-11-21 | Disposition: A | Discharge status: Home / Self Care | Payer: Medicare Other | Attending: Emergency Medicine | Admitting: Emergency Medicine

## 2014-11-21 ENCOUNTER — Encounter (HOSPITAL_COMMUNITY): Payer: Self-pay

## 2014-11-21 DIAGNOSIS — M1990 Unspecified osteoarthritis, unspecified site: Secondary | ICD-10-CM | POA: Insufficient documentation

## 2014-11-21 DIAGNOSIS — I129 Hypertensive chronic kidney disease with stage 1 through stage 4 chronic kidney disease, or unspecified chronic kidney disease: Secondary | ICD-10-CM | POA: Diagnosis not present

## 2014-11-21 DIAGNOSIS — Z79899 Other long term (current) drug therapy: Secondary | ICD-10-CM | POA: Diagnosis not present

## 2014-11-21 DIAGNOSIS — T391X1A Poisoning by 4-Aminophenol derivatives, accidental (unintentional), initial encounter: Secondary | ICD-10-CM

## 2014-11-21 DIAGNOSIS — I10 Essential (primary) hypertension: Secondary | ICD-10-CM | POA: Diagnosis not present

## 2014-11-21 DIAGNOSIS — E1165 Type 2 diabetes mellitus with hyperglycemia: Secondary | ICD-10-CM | POA: Insufficient documentation

## 2014-11-21 DIAGNOSIS — F329 Major depressive disorder, single episode, unspecified: Secondary | ICD-10-CM | POA: Diagnosis not present

## 2014-11-21 DIAGNOSIS — R739 Hyperglycemia, unspecified: Secondary | ICD-10-CM

## 2014-11-21 DIAGNOSIS — K0889 Other specified disorders of teeth and supporting structures: Secondary | ICD-10-CM

## 2014-11-21 DIAGNOSIS — K088 Other specified disorders of teeth and supporting structures: Secondary | ICD-10-CM | POA: Insufficient documentation

## 2014-11-21 DIAGNOSIS — N189 Chronic kidney disease, unspecified: Secondary | ICD-10-CM | POA: Diagnosis not present

## 2014-11-21 DIAGNOSIS — F419 Anxiety disorder, unspecified: Secondary | ICD-10-CM | POA: Diagnosis not present

## 2014-11-21 DIAGNOSIS — E785 Hyperlipidemia, unspecified: Secondary | ICD-10-CM | POA: Diagnosis not present

## 2014-11-21 LAB — CBG MONITORING, ED
GLUCOSE-CAPILLARY: 461 mg/dL — AB (ref 70–99)
GLUCOSE-CAPILLARY: 436 mg/dL — AB (ref 70–99)
GLUCOSE-CAPILLARY: 394 mg/dL — AB (ref 70–99)
GLUCOSE-CAPILLARY: 250 mg/dL — AB (ref 70–99)
GLUCOSE-CAPILLARY: 167 mg/dL — AB (ref 70–99)
Glucose-Capillary: 384 mg/dL — ABNORMAL HIGH (ref 70–99)
Glucose-Capillary: 408 mg/dL — ABNORMAL HIGH (ref 70–99)

## 2014-11-21 LAB — COMPREHENSIVE METABOLIC PANEL
ALBUMIN: 3.5 g/dL (ref 3.5–5.2)
ALK PHOS: 62 U/L (ref 39–117)
ALT: 60 U/L — ABNORMAL HIGH (ref 0–53)
ALT: 63 U/L — ABNORMAL HIGH (ref 0–53)
ANION GAP: 13 (ref 5–15)
ANION GAP: 14 (ref 5–15)
AST: 46 U/L — ABNORMAL HIGH (ref 0–37)
AST: 42 U/L — ABNORMAL HIGH (ref 0–37)
Albumin: 3.5 g/dL (ref 3.5–5.2)
Alkaline Phosphatase: 67 U/L (ref 39–117)
BUN: 25 mg/dL — ABNORMAL HIGH (ref 6–23)
BUN: 21 mg/dL (ref 6–23)
CALCIUM: 9 mg/dL (ref 8.4–10.5)
CO2: 22 mmol/L (ref 19–32)
CO2: 23 mmol/L (ref 19–32)
Calcium: 8.9 mg/dL (ref 8.4–10.5)
Chloride: 95 mmol/L — ABNORMAL LOW (ref 96–112)
Chloride: 97 mmol/L (ref 96–112)
Creatinine, Ser: 1.43 mg/dL — ABNORMAL HIGH (ref 0.50–1.35)
Creatinine, Ser: 1.27 mg/dL (ref 0.50–1.35)
GFR calc non Af Amer: 62 mL/min — ABNORMAL LOW (ref >90)
GFR, EST AFRICAN AMERICAN: 62 mL/min — AB (ref >90)
GFR, EST AFRICAN AMERICAN: 72 mL/min — AB (ref >90)
GFR, EST NON AFRICAN AMERICAN: 54 mL/min — AB (ref >90)
GLUCOSE: 397 mg/dL — AB (ref 70–99)
Glucose, Bld: 479 mg/dL — ABNORMAL HIGH (ref 70–99)
POTASSIUM: 2.8 mmol/L — AB (ref 3.5–5.1)
POTASSIUM: 2.8 mmol/L — AB (ref 3.5–5.1)
SODIUM: 130 mmol/L — AB (ref 135–145)
SODIUM: 134 mmol/L — AB (ref 135–145)
TOTAL PROTEIN: 6.9 g/dL (ref 6.0–8.3)
Total Bilirubin: 0.8 mg/dL (ref 0.3–1.2)
Total Bilirubin: 0.7 mg/dL (ref 0.3–1.2)
Total Protein: 7.5 g/dL (ref 6.0–8.3)

## 2014-11-21 LAB — SALICYLATE LEVEL: Salicylate Lvl: <4 mg/dL (ref 2.8–20.0)

## 2014-11-21 LAB — TSH: TSH: 0.859 u[IU]/mL (ref 0.350–4.500)

## 2014-11-21 LAB — PROTIME-INR
INR: 0.94 (ref 0.00–1.49)
Prothrombin Time: 12.7 seconds (ref 11.6–15.2)

## 2014-11-21 LAB — CBC
HEMATOCRIT: 41.8 % (ref 39.0–52.0)
HEMOGLOBIN: 14.2 g/dL (ref 13.0–17.0)
MCH: 27.1 pg (ref 26.0–34.0)
MCHC: 34 g/dL (ref 30.0–36.0)
MCV: 79.8 fL (ref 78.0–100.0)
Platelets: 363 10*3/uL (ref 150–400)
RBC: 5.24 MIL/uL (ref 4.22–5.81)
RDW: 13.1 % (ref 11.5–15.5)
WBC: 6.5 10*3/uL (ref 4.0–10.5)

## 2014-11-21 LAB — ACETAMINOPHEN LEVEL: ACETAMINOPHEN (TYLENOL), SERUM: 22.7 ug/mL (ref 10–30)

## 2014-11-21 MED ORDER — HYDROMORPHONE HCL 1 MG/ML IJ SOLN
1.0000 mg | Freq: Once | INTRAMUSCULAR | Status: AC
  Administered 2014-11-21: 1 mg via INTRAVENOUS
  Filled 2014-11-21: qty 1

## 2014-11-21 MED ORDER — BUPIVACAINE-EPINEPHRINE (PF) 0.5% -1:200000 IJ SOLN
1.8000 mL | Freq: Once | INTRAMUSCULAR | Status: AC
  Administered 2014-11-21: 1.8 mL
  Filled 2014-11-21: qty 1.8

## 2014-11-21 MED ORDER — ACETYLCYSTEINE 20 % IN SOLN
140.0000 mg/kg | Freq: Once | RESPIRATORY_TRACT | Status: AC
  Administered 2014-11-21: 14980 mg via ORAL
  Filled 2014-11-21: qty 90

## 2014-11-21 MED ORDER — POTASSIUM CHLORIDE CRYS ER 20 MEQ PO TBCR
40.0000 meq | EXTENDED_RELEASE_TABLET | Freq: Once | ORAL | Status: AC
  Administered 2014-11-21: 40 meq via ORAL
  Filled 2014-11-21: qty 2

## 2014-11-21 MED ORDER — OXYCODONE HCL 5 MG PO TABS
10.0000 mg | ORAL_TABLET | Freq: Once | ORAL | Status: AC
  Administered 2014-11-21: 10 mg via ORAL
  Filled 2014-11-21: qty 2

## 2014-11-21 MED ORDER — INSULIN REGULAR HUMAN 100 UNIT/ML IJ SOLN
INTRAMUSCULAR | Status: DC
  Administered 2014-11-21: 3.2 [IU]/h via INTRAVENOUS
  Filled 2014-11-21: qty 2.5

## 2014-11-21 MED ORDER — HYDROMORPHONE HCL 2 MG PO TABS: 1.0000 mg | ORAL_TABLET | Freq: Four times a day (QID) | ORAL | Status: DC | PRN

## 2014-11-21 MED ORDER — DEXTROSE-NACL 5-0.45 % IV SOLN
INTRAVENOUS | Status: DC
  Administered 2014-11-21: 14:00:00 via INTRAVENOUS

## 2014-11-21 MED ORDER — SODIUM CHLORIDE 0.9 % IV BOLUS (SEPSIS)
1000.0000 mL | Freq: Once | INTRAVENOUS | Status: AC
  Administered 2014-11-21: 1000 mL via INTRAVENOUS

## 2014-11-21 MED ORDER — ONDANSETRON 4 MG PO TBDP
4.0000 mg | ORAL_TABLET | Freq: Once | ORAL | Status: AC
  Administered 2014-11-21: 4 mg via ORAL
  Filled 2014-11-21: qty 1

## 2014-11-21 MED ORDER — AMOXICILLIN 500 MG PO TABS: 500.0000 mg | ORAL_TABLET | Freq: Three times a day (TID) | ORAL | Status: DC

## 2014-11-21 NOTE — ED Notes (Signed)
Pt advised upon discharge that he is unable to drive until around 5pm because of the narcotic medication he received in the ER. Pt is agreeable to this plan and will wait in the lobby until he is able to drive.

## 2014-11-21 NOTE — ED Provider Notes (Signed)
CSN: [id number]     Arrival date & time 11/21/14  [id number] History   First MD Initiated Contact with Patient 11/21/14 [phone]     Chief Complaint  Patient presents with  . Dental Pain     (Consider location/radiation/quality/duration/timing/severity/associated sxs/prior Treatment) Patient is a 54 y.o. male presenting with tooth pain.  Dental Pain   Angel Starrhomas Bisono Is a [age] year old male with a past medical history of hypertension, diabetes, chronic arthritis of the knee, depression, anxiety, and chronic kidney disease who presents to the emergency department with chief complaint of tooth pain. The patient is [facility] the [facility]. He has a previous surgical history of gastric bypass, knee arthroscopy and spinal fusion surgery. The patient states that for 1 week. He has had worsening tooth pain in the upper left lateral incisor. He describes it as pounding, severe, at times sharp, radiating to the left jaw and ear. He denies any fevers, chills, difficulty breathing or swallowing. The patient has been under significant stress. 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 Patient states that he was presents to turn and a project yesterday but had difficulty turning it and because it wasn't perfect. He states that he felt significant anxiety, palpitations. The patient states that his tooth pain and was also contributing to this. The patient began taking 500 mg Tylenols about 8:30 PM last night to help with his pain. He had minimal improvement, however, the patient took between 60 and 70, 500 mg Tylenols between 8:30 PM last night and 5:30 AM this morning. The patient states that he also had several episodes of nausea and vomiting in between last night and this morning. He has not taken any of his regular medications. Patient denies suicidal ideation or intentional self-harm. Denies fevers, chills, myalgias, arthralgias. Denies DOE, SOB, chest tightness or pressure, radiation to left  arm, jaw or back, or diaphoresis. Denies dysuria, flank pain, suprapubic pain, frequency, urgency, or hematuria. Denies headaches, light headedness, weakness, visual disturbances.    Past Medical History  Diagnosis Date  . Hypertension   . Diabetes mellitus without complication   . Arthritis   . Tuberculosis     tb positive 1989 ?   Marland Kitchen. Depression   . PONV (postoperative nausea and vomiting)   . Chronic kidney disease 02/2013    ACUTE RENAL FAILURE   Past Surgical History  Procedure Laterality Date  . Knee arthroscopy    . Gastric bypass    . Spinal fusion      Harrington Rod   History reviewed. No pertinent family history. History  Substance Use Topics  . Smoking status: Never Smoker   . Smokeless tobacco: Never Used  . Alcohol Use: Yes     Comment: hx of   " anything "   trying quit right now "    Review of Systems  Ten systems reviewed and are negative for acute change, except as noted in the HPI.    Allergies  Review of patient's allergies indicates no known allergies.  Home Medications   Prior to Admission medications   Medication Sig Start Date End Date Taking? Authorizing Provider  amLODipine (NORVASC) 10 MG tablet Take 1 tablet (10 mg total) by mouth daily. For high blood pressure control 06/15/13   Sanjuana KavaAgnes I Nwoko, NP  chlorthalidone (HYGROTON) 25 MG tablet Take 1 tablet (25 mg total) by mouth daily. For control of HTN/diabetes mellitus 06/15/13   Sanjuana KavaAgnes I Nwoko, NP  cyclobenzaprine (FLEXERIL) 5 MG tablet  Take 1 tablet (5 mg total) by mouth 2 (two) times daily. For muscle spasm 06/15/13   Sanjuana Kava, NP  gabapentin (NEURONTIN) 300 MG capsule Take 2 capsules (600 mg total) by mouth 2 (two) times daily. For pain/anxiety 06/15/13   Sanjuana Kava, NP  lidocaine (LIDODERM) 5 % Place 1 patch onto the skin daily. Remove & Discard patch within 12 hours or as directed by MD: For pain management 06/15/13   Sanjuana Kava, NP  lisinopril (PRINIVIL,ZESTRIL) 20 MG tablet Take 0.5  tablets (10 mg total) by mouth daily. For high blood pressure control 06/15/13   Sanjuana Kava, NP  metFORMIN (GLUCOPHAGE) 1000 MG tablet Take 1 tablet (1,000 mg total) by mouth 2 (two) times daily with a meal. For diabetes management 06/15/13   Sanjuana Kava, NP  sertraline (ZOLOFT) 25 MG tablet Take 1 tablet (25 mg total) by mouth at bedtime. For depression 06/15/13   Sanjuana Kava, NP  simvastatin (ZOCOR) 10 MG tablet Take 1 tablet (10 mg total) by mouth daily at 6 PM. For high cholesterol control 06/15/13   Sanjuana Kava, NP   BP 166/98 mmHg  Pulse 111  Temp(Src) 97.7 F (36.5 C) (Oral)  Resp 18  Ht  (1.803 m)  Wt 236 lb (107.049 kg)  BMI 32.93 kg/m2  SpO2 100% Physical Exam  Constitutional: He appears well-developed and well-nourished. No distress.  Anxious 54 year old male in no acute distress.  HENT:  Head: Normocephalic and atraumatic.  Mouth/Throat:    Multiple decaying teeth. Previous dental work present. No gingival fluctuance or signs of infection. Oropharynx is clear and moist without swelling. No cervical tenderness.  Eyes: Conjunctivae are normal. No scleral icterus.  Neck: Normal range of motion. Neck supple.  Cardiovascular: Normal rate, regular rhythm and normal heart sounds.   Pulmonary/Chest: Effort normal and breath sounds normal. No respiratory distress.  Abdominal: Soft. There is no tenderness.  Musculoskeletal: He exhibits no edema.  Neurological: He is alert.  Skin: Skin is warm and dry. He is not diaphoretic.  Psychiatric: His behavior is normal.  Nursing note and vitals reviewed.   ED Course  Procedures (including critical care time) Labs Review Labs Reviewed  COMPREHENSIVE METABOLIC PANEL - Abnormal; Notable for the following:    Sodium 130 (*)    Potassium 2.8 (*)    Chloride 95 (*)    Glucose, Bld 479 (*)    BUN 25 (*)    Creatinine, Ser 1.43 (*)    AST 46 (*)    ALT 60 (*)    GFR calc non Af Amer 54 (*)    GFR calc Af Amer 62 (*)     All other components within normal limits  CBG MONITORING, ED - Abnormal; Notable for the following:    Glucose-Capillary 461 (*)    All other components within normal limits  CBC  ACETAMINOPHEN LEVEL  SALICYLATE LEVEL  TSH    Imaging Review No results found.   EKG Interpretation   Date/Time:  Tuesday November 21 2014 07:18:26 EST Ventricular Rate:  93 PR Interval:  156 QRS Duration: 78 QT Interval:  377 QTC Calculation: 469 R Axis:   -18 Text Interpretation:  Sinus rhythm Abnormal R-wave progression, early  transition LVH with secondary repolarization abnormality Confirmed by  ZAMMIT  MD, Jomarie Longs ([id number]) on 11/21/2014 7:24:42 AM       Dental Performed by: Bobbye Riggs PA-S (under my direct supervision) Authorized by: Arthor Captain Consent: Verbal  consent obtained. Patient understanding: patient states understanding of the procedure being performed Patient identity confirmed: verbally with patient Local anesthesia used: yes Local anesthetic: bupivacaine 0.5% with epinephrine Anesthetic total: 0.8 ml Patient sedated: no Patient tolerance: Patient tolerated the procedure well with no immediate complications.    MDM   Final diagnoses:  None    9:13 AM BP 187/100 mmHg  Pulse 79  Temp(Src) 97.7 F (36.5 C) (Oral)  Resp 16  Ht  (1.803 m)  Wt 236 lb (107.049 kg)  BMI 32.93 kg/m2  SpO2 96% Patient with continued tooth pain despite oxy. Will perform a dental block.  I have been in contact with [place] poison control center. The patient has already been given an initial loading dose of N-acetylcysteine. Patient has hyperglycemia at 461. His potassium is 2.8, which I suspect is likely pseudohyponatremia secondary to his elevated glucose. His salicylate level is undetectable and he has a Tylenol level of 22.7.  EKG is without QT prolongation.  .10:15 AM BP 174/85 mmHg  Pulse 94  Temp(Src) 97.4 F (36.3 C) (Oral)  Resp 22  Ht  (1.803 m)   Wt 236 lb (107.049 kg)  BMI 32.93 kg/m2  SpO2 97% Allergies spoken with Corrie Dandy and the nurse at [facility] poison control center. According to their calculations and after discussion with the toxicologist. The patient does not appear to need further treatment with N-acetylcysteine. However, because he has a history of diabetes and may have symptoms to bleed gut motility. Do recommend a repeat hepatic function panel 4 hours from the initial draw. I will repeat a CMP to also check the patient's potassium levels at that time.  Patient repeat CMP shows not sig elevation in AST/ALT. I have discussed with [facility] Dr[facility] and no further treatment is needed.  Patient will be discharged with pain management for his tooth. F/U with dentist. DC use of tylenol. Hyperglycemia is resolved with insulin and fluids. Pt stable in ED with no significant deterioration in condition. I personally reviewed the imaging tests through PACS system. I have reviewed and interpreted Lab values. I reviewed available ER/hospitalization records through the EMR     Arthor Captain, PA-C 11/23/14 1742  Arthor Captain, PA-C 11/23/14 1742  Benny Lennert, MD 11/24/14 [phone]

## 2014-11-21 NOTE — Discharge Instructions (Signed)
Accidental Overdose A drug overdose occurs when a chemical substance (drug or medication) is used in amounts large enough to overcome a person. This may result in severe illness or death. This is a type of poisoning. Accidental overdoses of medications or other substances come from a variety of reasons. When this happens accidentally, it is often because the person taking the substance does not know enough about what they have taken. Drugs which commonly cause overdose deaths are alcohol, psychotropic medications (medications which affect the mind), pain medications, illegal drugs (street drugs) such as cocaine and heroin, and multiple drugs taken at the same time. It may result from careless behavior (such as over-indulging at a party). Other causes of overdose may include multiple drug use, a lapse in memory, or drug use after a period of no drug use.  Sometimes overdosing occurs because a person cannot remember if they have taken their medication.  A common unintentional overdose in young children involves multi-vitamins containing iron. Iron is a part of the hemoglobin molecule in blood. It is used to transport oxygen to living cells. When taken in small amounts, iron allows the body to restock hemoglobin. In large amounts, it causes problems in the body. If this overdose is not treated, it can lead to death. Never take medicines that show signs of tampering or do not seem quite right. Never take medicines in the dark or in poor lighting. Read the label and check each dose of medicine before you take it. When adults are poisoned, it happens most often through carelessness or lack of information. Taking medicines in the dark or taking medicine prescribed for someone else to treat the same type of problem is a dangerous practice. SYMPTOMS  Symptoms of overdose depend on the medication and amount taken. They can vary from over-activity with stimulant over-dosage, to sleepiness from depressants such as  alcohol, narcotics and tranquilizers. Confusion, dizziness, nausea and vomiting may be present. If problems are severe enough coma and death may result. DIAGNOSIS  Diagnosis and management are generally straightforward if the drug is known. Otherwise it is more difficult. At times, certain symptoms and signs exhibited by the patient, or blood tests, can reveal the drug in question.  TREATMENT  In an emergency department, most patients can be treated with supportive measures. Antidotes may be available if there has been an overdose of opioids or benzodiazepines. A rapid improvement will often occur if this is the cause of overdose. At home or away from medical care:  There may be no immediate problems or warning signs in children.  Not everything works well in all cases of poisoning.  Take immediate action. Poisons may act quickly.  If you think someone has swallowed medicine or a household product, and the person is unconscious, having seizures (convulsions), or is not breathing, immediately call for an ambulance. IF a person is conscious and appears to be doing OK but has swallowed a poison:  Do not wait to see what effect the poison will have. Immediately call a poison control center (listed in the white pages of your telephone book under "Poison Control" or inside the front cover with other emergency numbers). Some poison control centers have TTY capability for the deaf. Check with your local center if you or someone in your family requires this service.  Keep the container so you can read the label on the product for ingredients.  Describe what, when, and how much was taken and the age and condition of the person poisoned.  Inform them if the person is vomiting, choking, drowsy, shows a change in color or temperature of skin, is conscious or unconscious, or is convulsing.  Do not cause vomiting unless instructed by medical personnel. Do not induce vomiting or force liquids into a person who  is convulsing, unconscious, or very drowsy. Stay calm and in control.   Activated charcoal also is sometimes used in certain types of poisoning and you may wish to add a supply to your emergency medicines. It is available without a prescription. Call a poison control center before using this medication. PREVENTION  Thousands of children die every year from unintentional poisoning. This may be from household chemicals, poisoning from carbon monoxide in a car, taking their parent's medications, or simply taking a few iron pills or vitamins with iron. Poisoning comes from unexpected sources.  Store medicines out of the sight and reach of children, preferably in a locked cabinet. Do not keep medications in a food cabinet. Always store your medicines in a secure place. Get rid of expired medications.  If you have children living with you or have them as occasional guests, you should have child-resistant caps on your medicine containers. Keep everything out of reach. Child proof your home.  If you are called to the telephone or to answer the door while you are taking a medicine, take the container with you or put the medicine out of the reach of small children.  Do not take your medication in front of children. Do not tell your child how good a medication is and how good it is for them. They may get the idea it is more of a treat.  If you are an adult and have accidentally taken an overdose, you need to consider how this happened and what can be done to prevent it from happening again. If this was from a street drug or alcohol, determine if there is a problem that needs addressing. If you are not sure a problems exists, it is easy to talk to a professional and ask them if they think you have a problem. It is better to handle this problem in this way before it happens again and has a much worse consequence. Document Released: 11/29/2004 Document Revised: 12/08/2011 Document Reviewed: 05/07/2009 [facility]  Patient Information 2015 [place], [place]. This information is not intended to replace advice given to you by your health care provider. Make sure you discuss any questions you have with your health care provider.   You have been diagnosed with Dental pain. Please call the follow up dentist first thing in the morning on Monday for a follow up appointment. Keep your discharge paperwork from today's visit to bring to the dentist office. You may also use the resource guide listed below to help you find a dentist if you do not already have one to followup with. It is very important that you get evaluated by a dentist as soon as possible.  Use your pain medication as prescribed and do not operate heavy machinery while on pain medication. Note that your pain medication contains acetaminophen (Tylenol) & its is not reccommended that you use additional acetaminophen (Tylenol) while taking this medication. Take your full course of antibiotics. Read the instructions below.  Eat a soft or liquid diet and rinse your mouth out after meals with warm water. You should see a dentist or return here at once if you have increased swelling, increased pain or uncontrolled bleeding from the site of your injury.   SEEK MEDICAL  CARE IF:   You have increased pain not controlled with medicines.   You have swelling around your tooth, in your face or neck.   You have bleeding which starts, continues, or gets worse.   You have a fever >101  If you are unable to open your mouth Soft Diet  The soft diet may be recommended after you were put on a full liquid diet. A normal diet may follow. The soft diet can also be used after surgery if you are too ill to keep down a normal diet. The soft diet may also be needed if you have a hard time chewing foods.  DESCRIPTION  Tender foods are used. Foods do not need to be ground or pureed. Most raw fruits and vegetables and coarse breads and cereals should be avoided. Fried foods and  highly seasoned foods may cause discomfort.  NUTRITIONAL ADEQUACY  A healthy diet is possible if foods from each of the basic food groups are eaten daily.  SOFT DIET FOOD LISTS  Milk/Dairy  Allowed: Milk and milk drinks, milk shakes, cream cheese, cottage cheese, mild cheeses.  Avoid: Sharp or highly seasoned cheese. Meat/Meat Substitutes  Allowed: Broiled, roasted, baked, or stewed tender lean beef, mutton, lamb, veal, chicken, [place]key, liver, ham, crisp bacon, white fish, tuna, salmon. Eggs, smooth peanut butter.  Avoid: All fried meats, fish, or fowl. Rich gravies and sauces. Lunch meats, sausages, hot dogs. Meats with gristle, chunky peanut butter. Breads/Grains  Allowed: Rice, noodles, spaghetti, macaroni. Dry or cooked refined cereals, such as farina, cream of wheat, oatmeal, grits, whole-wheat cereals. Plain or toasted white or wheat blend or whole-grain breads, soda crackers or saltines, flour tortillas.  Avoid: Wild rice, coarse cereals, such as bran. Seed in or on breads and crackers. Bread or bread products with nuts or seeds. Fruits/Vegetables  Allowed: Fruit and vegetable juices, well-cooked or canned fruits and vegetables, any dried fruit. One citrus fruit daily, 1 vitamin A source daily. Well-ripened, easy to chew fruits, sweet potatoes. Baked, boiled, mashed, creamed, scalloped, or au gratin potatoes. Broths or creamed soups made with allowed vegetables, strained tomatoes.  Avoid: All gas-forming vegetables (corn, radishes, Brussels sprouts, onions, broccoli, cabbage, parsnips, turnips, chili peppers, pinto beans, split peas, dried beans). Fruits containing seeds and skin. Potato chips and corn chips. All others that are not made with allowed vegetables. Highly seasoned soups. Desserts/Sweets  Allowed: Simple desserts, such as custard, junkets, gelatin desserts, plain ice cream and sherbets, simple cakes and cookies, allowed fruits, sugar, syrup, jelly, honey, plain hard candy, and  molasses.  Avoid: Rich pastries, any dessert containing dates, nuts, raisins, or coconut. Fried pastries, such as doughnuts. Chocolate. Beverages  Allowed: Fruit and vegetable juices. Caffeine-free carbonated drinks, coffee, and tea.  Avoid: Caffeinated beverages: coffee, tea, soda or pop. Miscellaneous  Allowed: Butter, cream, margarine, mayonnaise, oil. Cream sauces, salt, and mild spices.  Avoid: Highly spiced salad dressings. Highly seasoned foods, hot sauce, mustard, horseradish, and pepper. SAMPLE MENU  Breakfast  Orange juice.  Oatmeal.  Soft cooked egg.  Toast and margarine.  2% milk.  Coffee. Lunch  Meatloaf.  Mashed potato.  Green beans.  Lemon pudding.  Bread and margarine.  Coffee. Dinner  Consomm or apricot nectar.  Chicken breast.  Rice, peas, and carrots.  Applesauce.  Bread and margarine.  2% milk. To cut the amount of fat in your diet, omit margarine and use 1% or skim milk.  NUTRIENT ANALYSIS  Calories........................1953 Kcal.  Protein.........................102 gm.  Carbohydrate...............247 gm.  Fat................................65 gm.  Cholesterol...................449 mg.  Dietary fiber.................19 gm.  Vitamin A.....................2944 RE.  Vitamin C.....................79 mg.  Niacin..........................25 mg.  Riboflavin....................2.0 mg.  Thiamin.......................1.5 mg.  Folate..........................249 mcg.  Calcium.......................1030 mg.  Phosphorus.................1782 mg.  Zinc..............................12 mg.  Iron..............................13 mg.  Sodium.........................299 mg.  Potassium....................3046 mg. Document Released: 12/23/2007 Document Revised: 12/08/2011 Document Reviewed: 12/23/2007  [facility] Patient Information 2014 [place], [place].   RESOURCE GUIDE   Dental Problems  Dr. Grandville Silos $200 dollar visit [address] [place], [place] [address]  [phone]    Patients with Medicaid: [facility] [phone] [address]                                           [phone] [address] Phone:  [phone]                                                  Phone:  [phone]  If unable to pay or uninsured, contact:  [facility] or [facility]. to become qualified for the adult dental clinic.  Chronic Pain Problems Contact [facility]  [phone] Patients need to be referred by their primary care doctor.  Insufficient Money for Medicine Contact United Way:  call "211" or [facility] [phone].  No Primary Care Doctor Call [facility]  [phone] Other agencies that provide inexpensive medical care    [facility]  [phone]    [facility]  [phone]    [facility]  [phone]    [facility]  [phone]    [facility]  [phone]    [facility]  [phone]  Psychological Services [facility] Behavior[facility]  [phone] [facility]  [phone] [facility]   [phone] (emergency services [phone])  Substance Abuse Resources [facility]  [phone] [facility] [phone] [facility] [place] [phone] Floydene Flock [phone] Residential & Outpatient Substance Abuse Program  [phone]  Abuse/Neglect [facility] Child Abuse Hotline 220-718-0727 [facility] Child Abuse Hotline 256-565-6659 (After Hours)  Emergency Shelter [facility] [phone]  Maternity Homes Room at the [place] [facility] [place] [phone] [facility] [phone]  MRSA Hotline #:   361-106-3866    [facility] Resources  [facility] [place]     [employer]                          [facility] Dept. [address] [place]                       [address]      [address] [place] [address]  Patrecia Pace  [facility] Phone:  [phone]                                   Phone:  [phone]                 Phone:  [phone]  [facility] Phone:  [phone]  [facility] Child Abuse Hotline [phone](336) [phone](336) [phone] (After Hours)

## 2014-11-21 NOTE — ED Notes (Signed)
Pt [facility] because we do not have a dental clinic in the ER. Explained to pt that we have given him 2 doses of pain medication for the pain. Pt would like to have his tooth pulled. Explained to pt that this is not something we do in the ER.

## 2014-11-21 NOTE — ED Notes (Signed)
Pt complains of a toothache for 4 days, he states that he had a panic attack yesterday and [place] [facility], he also states that he took almost a whole bottle of tylenol with no relief. Pt is hyperventilating in triage

## 2014-11-29 ENCOUNTER — Other Ambulatory Visit (HOSPITAL_COMMUNITY): Payer: Self-pay | Admitting: *Deleted

## 2014-11-29 NOTE — Pre-Procedure Instructions (Signed)
Angel Phillips  11/29/2014   Your procedure is scheduled on:  Monday, December 11, 2014 at 10:00 AM.   [facility] at 8:00 AM.   Call this number if you have problems the morning of surgery: [phone]               Any questions prior to day of surgery, please call [phone] between 8 & 4 PM.   Remember:   Do not eat food or drink liquids after midnight Sunday, 12/10/14.   Take these medicines the morning of surgery with A SIP OF WATER: Amlodipine (Norvasc), Gabapentin (Neurontin), Sertraline (Zoloft), Tylenol - if needed   Do not wear jewelry.  Do not wear lotions, powders, or cologne. You may wear deodorant.  Men may shave face and neck.  Do not bring valuables to the hospital.  [facility] is not responsible                  for any belongings or valuables.               Contacts, dentures or bridgework may not be worn into surgery.  Leave suitcase in the car. After surgery it may be brought to your room.  For patients admitted to the hospital, discharge time is determined by your                treatment team.              Special Instructions:  See "Preparing for Surgery" Instruction sheet.   Please read over the following fact sheets that you were given: Pain Booklet, Coughing and Deep Breathing, Blood Transfusion Information, MRSA Information and Surgical Site Infection Prevention

## 2014-11-30 ENCOUNTER — Inpatient Hospital Stay (HOSPITAL_COMMUNITY)
Admission: RE | Admit: 2014-11-30 | Discharge: 2014-11-30 | Disposition: A | Discharge status: Home / Self Care | Payer: Self-pay | Source: Ambulatory Visit

## 2014-12-06 ENCOUNTER — Encounter (HOSPITAL_COMMUNITY): Payer: Self-pay

## 2014-12-06 ENCOUNTER — Encounter (HOSPITAL_COMMUNITY)
Admission: RE | Admit: 2014-12-06 | Discharge: 2014-12-06 | Disposition: A | Discharge status: Home / Self Care | Payer: Medicare Other | Source: Ambulatory Visit | Attending: Orthopedic Surgery | Admitting: Orthopedic Surgery

## 2014-12-06 DIAGNOSIS — M179 Osteoarthritis of knee, unspecified: Secondary | ICD-10-CM | POA: Diagnosis not present

## 2014-12-06 DIAGNOSIS — Z01818 Encounter for other preprocedural examination: Secondary | ICD-10-CM

## 2014-12-06 DIAGNOSIS — J209 Acute bronchitis, unspecified: Secondary | ICD-10-CM | POA: Diagnosis not present

## 2014-12-06 DIAGNOSIS — J9811 Atelectasis: Secondary | ICD-10-CM | POA: Diagnosis not present

## 2014-12-06 LAB — URINALYSIS, ROUTINE W REFLEX MICROSCOPIC
Glucose, UA: NEGATIVE mg/dL
Hgb urine dipstick: NEGATIVE
Ketones, ur: 15 mg/dL — AB
LEUKOCYTES UA: NEGATIVE
NITRITE: NEGATIVE
PH: 5 (ref 5.0–8.0)
PROTEIN: 30 mg/dL — AB
Specific Gravity, Urine: 1.026 (ref 1.005–1.030)
Urobilinogen, UA: 1 mg/dL (ref 0.0–1.0)

## 2014-12-06 LAB — TYPE AND SCREEN
ABO/RH(D): A POS
ANTIBODY SCREEN: NEGATIVE

## 2014-12-06 LAB — CBC WITH DIFFERENTIAL/PLATELET
BASOS PCT: 0 % (ref 0–1)
Basophils Absolute: 0 10*3/uL (ref 0.0–0.1)
EOS ABS: 0.1 10*3/uL (ref 0.0–0.7)
Eosinophils Relative: 2 % (ref 0–5)
HCT: 40.3 % (ref 39.0–52.0)
Hemoglobin: 13.3 g/dL (ref 13.0–17.0)
Lymphocytes Relative: 26 % (ref 12–46)
Lymphs Abs: 1.8 10*3/uL (ref 0.7–4.0)
MCH: 27.5 pg (ref 26.0–34.0)
MCHC: 33 g/dL (ref 30.0–36.0)
MCV: 83.3 fL (ref 78.0–100.0)
Monocytes Absolute: 0.4 10*3/uL (ref 0.1–1.0)
Monocytes Relative: 6 % (ref 3–12)
NEUTROS PCT: 66 % (ref 43–77)
Neutro Abs: 4.4 10*3/uL (ref 1.7–7.7)
Platelets: 362 10*3/uL (ref 150–400)
RBC: 4.84 MIL/uL (ref 4.22–5.81)
RDW: 13.9 % (ref 11.5–15.5)
WBC: 6.7 10*3/uL (ref 4.0–10.5)

## 2014-12-06 LAB — URINE MICROSCOPIC-ADD ON

## 2014-12-06 LAB — BASIC METABOLIC PANEL
Anion gap: 8 (ref 5–15)
BUN: 27 mg/dL — ABNORMAL HIGH (ref 6–23)
CO2: 30 mmol/L (ref 19–32)
CREATININE: 1.28 mg/dL (ref 0.50–1.35)
Calcium: 9 mg/dL (ref 8.4–10.5)
Chloride: 103 mmol/L (ref 96–112)
GFR calc Af Amer: 71 mL/min — ABNORMAL LOW (ref >90)
GFR calc non Af Amer: 61 mL/min — ABNORMAL LOW (ref >90)
Glucose, Bld: 144 mg/dL — ABNORMAL HIGH (ref 70–99)
Potassium: 3.7 mmol/L (ref 3.5–5.1)
Sodium: 141 mmol/L (ref 135–145)

## 2014-12-06 LAB — SURGICAL PCR SCREEN
MRSA, PCR: NEGATIVE
STAPHYLOCOCCUS AUREUS: NEGATIVE

## 2014-12-06 LAB — PROTIME-INR
INR: 0.98 (ref 0.00–1.49)
PROTHROMBIN TIME: 13.1 s (ref 11.6–15.2)

## 2014-12-06 LAB — ABO/RH: ABO/RH(D): A POS

## 2014-12-06 LAB — APTT: APTT: 36 s (ref 24–37)

## 2014-12-06 NOTE — Progress Notes (Signed)
Pt. States he has no one to stay with him after surgery and will need to go to rehab center. Pt. Instructed to call Dr. Wadie Lessenowan's office tomorrow to let them know.

## 2014-12-06 NOTE — Progress Notes (Signed)
This pt. Has tested at an evevated risk for obstructive sleep apnea during a pre-surgical visit,using the STOP BANG tool A score of 4 or greater is considered an elevated risk

## 2014-12-08 NOTE — H&P (Signed)
TOTAL KNEE ADMISSION H&P  Patient is being admitted for left total knee arthroplasty.  Subjective:  Chief Complaint:left knee pain.  HPI: Angel Phillips, 54 y.o. male, has a history of pain and functional disability in the left knee due to arthritis and has failed non-surgical conservative treatments for greater than 12 weeks to includeNSAID's and/or analgesics, corticosteriod injections, flexibility and strengthening excercises, use of assistive devices, weight reduction as appropriate and activity modification.  Onset of symptoms was gradual, starting several years ago with gradually worsening course since that time. The patient noted no past surgery on the left knee(s).  Patient currently rates pain in the left knee(s) at 10 out of 10 with activity. Patient has night pain, worsening of pain with activity and weight bearing and pain that interferes with activities of daily living.  Patient has evidence of subchondral sclerosis, joint subluxation and joint space narrowing by imaging studies. . There is no active infection.  Patient Active Problem List   Diagnosis Date Noted  . Alcohol dependence 06/13/2013  . 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 ETOH abuse 03/16/2013  . Depression, major 03/16/2013  . Acute encephalopathy 03/12/2013  . Acute respiratory failure 03/12/2013  . Metabolic acidosis 03/12/2013  . Lactic acid acidosis 03/12/2013  . Acute renal failure (ARF) 03/12/2013  . Sleep apnea (presumed) 03/12/2013  . Hyperglycemia 03/12/2013   Past Medical History  Diagnosis Date  . Hypertension   . Diabetes mellitus without complication   . Arthritis   . Tuberculosis     tb positive 1989 ?   Marland Kitchen Depression   . PONV (postoperative nausea and vomiting)   . Chronic kidney disease 02/2013    ACUTE RENAL FAILURE    Past Surgical History  Procedure Laterality Date  . Knee arthroscopy    . Gastric bypass    . Spinal fusion      Harrington Rod    No prescriptions prior to admission    Allergies  Allergen Reactions  . Oxycodone Itching and Other (See Comments)    "Makes me feel Bad"    History  Substance Use Topics  . Smoking status: Never Smoker   . Smokeless tobacco: Never Used  . Alcohol Use: No     Comment: no longer drinks alcohol"    No family history on file.   Review of Systems  Eyes: Positive for blurred vision.  Respiratory: Positive for shortness of breath.   Musculoskeletal: Positive for myalgias and joint pain.  Skin: Positive for rash.  Neurological: Positive for dizziness and headaches.  Endo/Heme/Allergies: Bruises/bleeds easily.  Psychiatric/Behavioral: Positive for depression. The patient has insomnia.     Objective:  Physical Exam  Constitutional: He is oriented to person, place, and time. He appears well-developed and well-nourished.  HENT:  Head: Normocephalic and atraumatic.  Eyes: Pupils are equal, round, and reactive to light.  Neck: Normal range of motion. Neck supple.  Cardiovascular: Intact distal pulses.   Respiratory: Effort normal.  Musculoskeletal: He exhibits tenderness.  The patient lacks 5 of full extension of both knees is had good flexion to 140.  His collateral ligaments are stable, but there is discomfort with varus and valgus stress.  There is also subpatellar crepitus as you taken through range of motion  Neurological: He is alert and oriented to person, place, and time.  Skin: Skin is warm and dry.  Psychiatric: He has a normal mood and affect. His behavior is normal. Judgment and thought content normal.    Vital signs in last 24  hours:    Labs:   Estimated body mass index is 38.11 kg/(m^2) as calculated from the following:   Height as of 06/11/13: 5\' 6"  (1.676 m).   Weight as of 10/31/14: 107.049 kg (236 lb).   Imaging Review Plain radiographs demonstrate  loss of cartilage height to both knees medially and laterally as well as lateral subluxation of the tibia beneath the femur 8 mm on the left and 5  mm on the right.  Assessment/Plan:  End stage arthritis, left knee   The patient history, physical examination, clinical judgment of the provider and imaging studies are consistent with end stage degenerative joint disease of the left knee(s) and total knee arthroplasty is deemed medically necessary. The treatment options including medical management, injection therapy arthroscopy and arthroplasty were discussed at length. The risks and benefits of total knee arthroplasty were presented and reviewed. The risks due to aseptic loosening, infection, stiffness, patella tracking problems, thromboembolic complications and other imponderables were discussed. The patient acknowledged the explanation, agreed to proceed with the plan and consent was signed. Patient is being admitted for inpatient treatment for surgery, pain control, PT, OT, prophylactic antibiotics, VTE prophylaxis, progressive ambulation and ADL's and discharge planning. The patient is planning to be discharged to skilled nursing facility

## 2014-12-10 MED ORDER — CEFAZOLIN SODIUM-DEXTROSE 2-3 GM-% IV SOLR
2.0000 g | INTRAVENOUS | Status: AC
  Administered 2014-12-11: 2 g via INTRAVENOUS
  Filled 2014-12-10: qty 50

## 2014-12-10 MED ORDER — TRANEXAMIC ACID 100 MG/ML IV SOLN
1000.0000 mg | INTRAVENOUS | Status: DC
  Filled 2014-12-10: qty 10

## 2014-12-10 NOTE — Anesthesia Preprocedure Evaluation (Addendum)
Anesthesia Evaluation  Patient identified by MRN, date of birth, ID band Patient awake    Reviewed: Allergy & Precautions, NPO status , Patient's Chart, lab work & pertinent test results  History of Anesthesia Complications (+) PONV  Airway Mallampati: II   Neck ROM: Full    Dental  (+) Edentulous Upper, Edentulous Lower   Pulmonary sleep apnea ,  breath sounds clear to auscultation        Cardiovascular hypertension, Pt. on medications  EKG LVH   Neuro/Psych Depression    GI/Hepatic   Endo/Other  diabetes, Poorly Controlled, Type 2  Renal/GU Renal InsufficiencyRenal diseaseCreat 1.28     Musculoskeletal   Abdominal (+) + obese,   Peds  Hematology   Anesthesia Other Findings   Reproductive/Obstetrics                            Anesthesia Physical Anesthesia Plan  ASA: III  Anesthesia Plan: Spinal   Post-op Pain Management:    Induction:   Airway Management Planned: Nasal Cannula  Additional Equipment:   Intra-op Plan:   Post-operative Plan: Extubation in OR  Informed Consent: I have reviewed the patients History and Physical, chart, labs and discussed the procedure including the risks, benefits and alternatives for the proposed anesthesia with the patient or authorized representative who has indicated his/her understanding and acceptance.     Plan Discussed with:   Anesthesia Plan Comments:         Anesthesia Quick Evaluation

## 2014-12-11 ENCOUNTER — Encounter (HOSPITAL_COMMUNITY): Payer: Self-pay | Admitting: *Deleted

## 2014-12-11 ENCOUNTER — Inpatient Hospital Stay (HOSPITAL_COMMUNITY): Payer: Medicare Other | Admitting: Anesthesiology

## 2014-12-11 ENCOUNTER — Encounter (HOSPITAL_COMMUNITY)
Admission: RE | Disposition: A | Discharge status: Skilled Nursing Facility | Payer: Self-pay | Source: Ambulatory Visit | Attending: Orthopedic Surgery

## 2014-12-11 ENCOUNTER — Inpatient Hospital Stay (HOSPITAL_COMMUNITY)
Admission: RE | Admit: 2014-12-11 | Discharge: 2014-12-14 | DRG: 470 | Disposition: A | Discharge status: Skilled Nursing Facility | Payer: Medicare Other | Source: Ambulatory Visit | Attending: Orthopedic Surgery | Admitting: Orthopedic Surgery

## 2014-12-11 DIAGNOSIS — N189 Chronic kidney disease, unspecified: Secondary | ICD-10-CM | POA: Diagnosis not present

## 2014-12-11 DIAGNOSIS — I129 Hypertensive chronic kidney disease with stage 1 through stage 4 chronic kidney disease, or unspecified chronic kidney disease: Secondary | ICD-10-CM | POA: Diagnosis present

## 2014-12-11 DIAGNOSIS — E119 Type 2 diabetes mellitus without complications: Secondary | ICD-10-CM | POA: Diagnosis not present

## 2014-12-11 DIAGNOSIS — Z9884 Bariatric surgery status: Secondary | ICD-10-CM | POA: Diagnosis not present

## 2014-12-11 DIAGNOSIS — F1020 Alcohol dependence, uncomplicated: Secondary | ICD-10-CM | POA: Diagnosis present

## 2014-12-11 DIAGNOSIS — S83105A Unspecified dislocation of left knee, initial encounter: Secondary | ICD-10-CM | POA: Diagnosis not present

## 2014-12-11 DIAGNOSIS — M179 Osteoarthritis of knee, unspecified: Secondary | ICD-10-CM | POA: Diagnosis not present

## 2014-12-11 DIAGNOSIS — Z96652 Presence of left artificial knee joint: Secondary | ICD-10-CM | POA: Diagnosis not present

## 2014-12-11 DIAGNOSIS — R531 Weakness: Secondary | ICD-10-CM | POA: Diagnosis not present

## 2014-12-11 DIAGNOSIS — M6281 Muscle weakness (generalized): Secondary | ICD-10-CM | POA: Diagnosis not present

## 2014-12-11 DIAGNOSIS — M1712 Unilateral primary osteoarthritis, left knee: Secondary | ICD-10-CM | POA: Diagnosis not present

## 2014-12-11 DIAGNOSIS — R278 Other lack of coordination: Secondary | ICD-10-CM | POA: Diagnosis not present

## 2014-12-11 DIAGNOSIS — M25662 Stiffness of left knee, not elsewhere classified: Secondary | ICD-10-CM | POA: Diagnosis not present

## 2014-12-11 DIAGNOSIS — F329 Major depressive disorder, single episode, unspecified: Secondary | ICD-10-CM | POA: Diagnosis not present

## 2014-12-11 DIAGNOSIS — Z981 Arthrodesis status: Secondary | ICD-10-CM | POA: Diagnosis not present

## 2014-12-11 DIAGNOSIS — Z888 Allergy status to other drugs, medicaments and biological substances status: Secondary | ICD-10-CM

## 2014-12-11 DIAGNOSIS — M25562 Pain in left knee: Secondary | ICD-10-CM | POA: Diagnosis present

## 2014-12-11 DIAGNOSIS — E785 Hyperlipidemia, unspecified: Secondary | ICD-10-CM | POA: Diagnosis not present

## 2014-12-11 DIAGNOSIS — R2681 Unsteadiness on feet: Secondary | ICD-10-CM | POA: Diagnosis not present

## 2014-12-11 DIAGNOSIS — G8918 Other acute postprocedural pain: Secondary | ICD-10-CM | POA: Diagnosis not present

## 2014-12-11 DIAGNOSIS — Z471 Aftercare following joint replacement surgery: Secondary | ICD-10-CM | POA: Diagnosis not present

## 2014-12-11 DIAGNOSIS — I10 Essential (primary) hypertension: Secondary | ICD-10-CM | POA: Diagnosis not present

## 2014-12-11 HISTORY — PX: TOTAL KNEE ARTHROPLASTY: SHX125

## 2014-12-11 LAB — GLUCOSE, CAPILLARY
GLUCOSE-CAPILLARY: 145 mg/dL — AB (ref 70–99)
GLUCOSE-CAPILLARY: 140 mg/dL — AB (ref 70–99)
Glucose-Capillary: 183 mg/dL — ABNORMAL HIGH (ref 70–99)
Glucose-Capillary: 121 mg/dL — ABNORMAL HIGH (ref 70–99)
Glucose-Capillary: 111 mg/dL — ABNORMAL HIGH (ref 70–99)

## 2014-12-11 SURGERY — ARTHROPLASTY, KNEE, TOTAL
Anesthesia: Spinal | Site: Knee | Laterality: Left

## 2014-12-11 MED ORDER — HYDROMORPHONE HCL 2 MG PO TABS: 2.0000 mg | ORAL_TABLET | Freq: Four times a day (QID) | ORAL | Status: DC | PRN

## 2014-12-11 MED ORDER — PROPOFOL 10 MG/ML IV BOLUS
INTRAVENOUS | Status: DC | PRN
  Administered 2014-12-11: 200 mg via INTRAVENOUS

## 2014-12-11 MED ORDER — METHOCARBAMOL 500 MG PO TABS
500.0000 mg | ORAL_TABLET | Freq: Four times a day (QID) | ORAL | Status: DC | PRN
  Administered 2014-12-11 – 2014-12-14 (×7): 500 mg via ORAL
  Filled 2014-12-11 (×5): qty 1

## 2014-12-11 MED ORDER — ALUM & MAG HYDROXIDE-SIMETH 200-200-20 MG/5ML PO SUSP: 30.0000 mL | ORAL | Status: DC | PRN

## 2014-12-11 MED ORDER — ASPIRIN EC 325 MG PO TBEC: 325.0000 mg | DELAYED_RELEASE_TABLET | Freq: Two times a day (BID) | ORAL | Status: DC

## 2014-12-11 MED ORDER — ROCURONIUM BROMIDE 100 MG/10ML IV SOLN
INTRAVENOUS | Status: DC | PRN
  Administered 2014-12-11: 40 mg via INTRAVENOUS
  Administered 2014-12-11: 10 mg via INTRAVENOUS

## 2014-12-11 MED ORDER — ONDANSETRON HCL 4 MG PO TABS: 4.0000 mg | ORAL_TABLET | Freq: Four times a day (QID) | ORAL | Status: DC | PRN

## 2014-12-11 MED ORDER — FLEET ENEMA 7-19 GM/118ML RE ENEM: 1.0000 | ENEMA | Freq: Once | RECTAL | Status: AC | PRN

## 2014-12-11 MED ORDER — MEPERIDINE HCL 25 MG/ML IJ SOLN: 6.2500 mg | INTRAMUSCULAR | Status: DC | PRN

## 2014-12-11 MED ORDER — TRANEXAMIC ACID 100 MG/ML IV SOLN
2000.0000 mg | INTRAVENOUS | Status: DC | PRN
  Administered 2014-12-11: 2000 mg via TOPICAL

## 2014-12-11 MED ORDER — ASPIRIN EC 325 MG PO TBEC
325.0000 mg | DELAYED_RELEASE_TABLET | Freq: Every day | ORAL | Status: DC
  Administered 2014-12-12 – 2014-12-14 (×3): 325 mg via ORAL
  Filled 2014-12-11 (×3): qty 1

## 2014-12-11 MED ORDER — NEOSTIGMINE METHYLSULFATE 10 MG/10ML IV SOLN
INTRAVENOUS | Status: DC | PRN
  Administered 2014-12-11: 2.5 mg via INTRAVENOUS

## 2014-12-11 MED ORDER — SODIUM CHLORIDE 0.9 % IJ SOLN
INTRAMUSCULAR | Status: DC | PRN
  Administered 2014-12-11: 40 mL

## 2014-12-11 MED ORDER — MIDAZOLAM HCL 2 MG/2ML IJ SOLN
INTRAMUSCULAR | Status: AC
  Administered 2014-12-11: 1 mg via INTRAVENOUS
  Filled 2014-12-11: qty 2

## 2014-12-11 MED ORDER — GLIPIZIDE 5 MG PO TABS
2.5000 mg | ORAL_TABLET | Freq: Two times a day (BID) | ORAL | Status: DC
  Administered 2014-12-12 – 2014-12-14 (×5): 2.5 mg via ORAL
  Filled 2014-12-11 (×5): qty 1

## 2014-12-11 MED ORDER — NEOSTIGMINE METHYLSULFATE 10 MG/10ML IV SOLN
INTRAVENOUS | Status: AC
  Filled 2014-12-11: qty 1

## 2014-12-11 MED ORDER — DIPHENHYDRAMINE HCL 12.5 MG/5ML PO ELIX: 12.5000 mg | ORAL_SOLUTION | ORAL | Status: DC | PRN

## 2014-12-11 MED ORDER — ARTIFICIAL TEARS OP OINT
TOPICAL_OINTMENT | OPHTHALMIC | Status: AC
  Filled 2014-12-11: qty 3.5

## 2014-12-11 MED ORDER — PROMETHAZINE HCL 25 MG/ML IJ SOLN: 6.2500 mg | INTRAMUSCULAR | Status: DC | PRN

## 2014-12-11 MED ORDER — LIDOCAINE HCL (CARDIAC) 20 MG/ML IV SOLN
INTRAVENOUS | Status: DC | PRN
  Administered 2014-12-11: 60 mg via INTRAVENOUS
  Administered 2014-12-11: 50 mg via INTRAVENOUS

## 2014-12-11 MED ORDER — HYDROCODONE-ACETAMINOPHEN 10-325 MG PO TABS
1.0000 | ORAL_TABLET | ORAL | Status: DC | PRN
  Administered 2014-12-11 – 2014-12-12 (×5): 2 via ORAL
  Administered 2014-12-12: 1 via ORAL
  Administered 2014-12-13 – 2014-12-14 (×7): 2 via ORAL
  Filled 2014-12-11 (×15): qty 2

## 2014-12-11 MED ORDER — METOCLOPRAMIDE HCL 5 MG/ML IJ SOLN: 5.0000 mg | Freq: Three times a day (TID) | INTRAMUSCULAR | Status: DC | PRN

## 2014-12-11 MED ORDER — METHOCARBAMOL 1000 MG/10ML IJ SOLN
500.0000 mg | Freq: Four times a day (QID) | INTRAVENOUS | Status: DC | PRN
  Filled 2014-12-11: qty 5

## 2014-12-11 MED ORDER — MENTHOL 3 MG MT LOZG: 1.0000 | LOZENGE | OROMUCOSAL | Status: DC | PRN

## 2014-12-11 MED ORDER — ARTIFICIAL TEARS OP OINT
TOPICAL_OINTMENT | OPHTHALMIC | Status: DC | PRN
  Administered 2014-12-11: 1 via OPHTHALMIC

## 2014-12-11 MED ORDER — LIDOCAINE HCL (CARDIAC) 20 MG/ML IV SOLN
INTRAVENOUS | Status: AC
  Filled 2014-12-11: qty 10

## 2014-12-11 MED ORDER — SUCCINYLCHOLINE CHLORIDE 20 MG/ML IJ SOLN
INTRAMUSCULAR | Status: DC | PRN
  Administered 2014-12-11: 140 mg via INTRAVENOUS

## 2014-12-11 MED ORDER — ROCURONIUM BROMIDE 50 MG/5ML IV SOLN
INTRAVENOUS | Status: AC
  Filled 2014-12-11: qty 1

## 2014-12-11 MED ORDER — GLYCOPYRROLATE 0.2 MG/ML IJ SOLN
INTRAMUSCULAR | Status: AC
  Filled 2014-12-11: qty 3

## 2014-12-11 MED ORDER — MIDAZOLAM HCL 5 MG/5ML IJ SOLN
INTRAMUSCULAR | Status: DC | PRN
  Administered 2014-12-11: 2 mg via INTRAVENOUS

## 2014-12-11 MED ORDER — CYCLOBENZAPRINE HCL 5 MG PO TABS: 5.0000 mg | ORAL_TABLET | Freq: Three times a day (TID) | ORAL | Status: DC | PRN

## 2014-12-11 MED ORDER — CEFUROXIME SODIUM 1.5 G IJ SOLR
INTRAMUSCULAR | Status: DC | PRN
  Administered 2014-12-11: 1.5 g

## 2014-12-11 MED ORDER — LISINOPRIL 20 MG PO TABS
20.0000 mg | ORAL_TABLET | Freq: Every day | ORAL | Status: DC
  Administered 2014-12-12 – 2014-12-14 (×3): 20 mg via ORAL
  Filled 2014-12-11 (×3): qty 1

## 2014-12-11 MED ORDER — DEXMEDETOMIDINE HCL 200 MCG/2ML IV SOLN
INTRAVENOUS | Status: DC | PRN
  Administered 2014-12-11: 8 ug via INTRAVENOUS
  Administered 2014-12-11: 12 ug via INTRAVENOUS

## 2014-12-11 MED ORDER — KCL IN DEXTROSE-NACL 20-5-0.45 MEQ/L-%-% IV SOLN
INTRAVENOUS | Status: DC
  Filled 2014-12-11 (×12): qty 1000

## 2014-12-11 MED ORDER — GLYCOPYRROLATE 0.2 MG/ML IJ SOLN
INTRAMUSCULAR | Status: DC | PRN
  Administered 2014-12-11: 0.3 mg via INTRAVENOUS

## 2014-12-11 MED ORDER — BUPIVACAINE LIPOSOME 1.3 % IJ SUSP
20.0000 mL | Freq: Once | INTRAMUSCULAR | Status: DC
  Filled 2014-12-11: qty 20

## 2014-12-11 MED ORDER — AMLODIPINE BESYLATE 10 MG PO TABS
10.0000 mg | ORAL_TABLET | Freq: Every day | ORAL | Status: DC
  Administered 2014-12-12 – 2014-12-14 (×3): 10 mg via ORAL
  Filled 2014-12-11 (×3): qty 1

## 2014-12-11 MED ORDER — CHLORHEXIDINE GLUCONATE 4 % EX LIQD
60.0000 mL | Freq: Once | CUTANEOUS | Status: DC
Start: 2014-12-11 — End: 2014-12-11
  Filled 2014-12-11: qty 60

## 2014-12-11 MED ORDER — FENTANYL CITRATE 0.05 MG/ML IJ SOLN
INTRAMUSCULAR | Status: DC | PRN
  Administered 2014-12-11: 100 ug via INTRAVENOUS
  Administered 2014-12-11: 50 ug via INTRAVENOUS
  Administered 2014-12-11 (×3): 100 ug via INTRAVENOUS
  Administered 2014-12-11: 50 ug via INTRAVENOUS

## 2014-12-11 MED ORDER — FENTANYL CITRATE 0.05 MG/ML IJ SOLN
INTRAMUSCULAR | Status: AC
  Filled 2014-12-11: qty 4

## 2014-12-11 MED ORDER — MIDAZOLAM HCL 2 MG/2ML IJ SOLN
INTRAMUSCULAR | Status: AC
  Filled 2014-12-11: qty 2

## 2014-12-11 MED ORDER — METHOCARBAMOL 500 MG PO TABS
ORAL_TABLET | ORAL | Status: AC
  Filled 2014-12-11: qty 1

## 2014-12-11 MED ORDER — ONDANSETRON HCL 4 MG/2ML IJ SOLN
INTRAMUSCULAR | Status: AC
  Filled 2014-12-11: qty 2

## 2014-12-11 MED ORDER — LACTATED RINGERS IV SOLN
INTRAVENOUS | Status: DC
  Administered 2014-12-11: 09:00:00 via INTRAVENOUS

## 2014-12-11 MED ORDER — FENTANYL CITRATE 0.05 MG/ML IJ SOLN
25.0000 ug | INTRAMUSCULAR | Status: DC | PRN
  Administered 2014-12-11 (×2): 50 ug via INTRAVENOUS

## 2014-12-11 MED ORDER — PROPOFOL 10 MG/ML IV BOLUS
INTRAVENOUS | Status: AC
  Filled 2014-12-11: qty 20

## 2014-12-11 MED ORDER — HYDROMORPHONE HCL 1 MG/ML IJ SOLN
INTRAMUSCULAR | Status: AC
  Filled 2014-12-11: qty 1

## 2014-12-11 MED ORDER — FENTANYL CITRATE 0.05 MG/ML IJ SOLN
50.0000 ug | Freq: Once | INTRAMUSCULAR | Status: AC
  Administered 2014-12-11: 50 ug via INTRAVENOUS

## 2014-12-11 MED ORDER — DOCUSATE SODIUM 100 MG PO CAPS
100.0000 mg | ORAL_CAPSULE | Freq: Two times a day (BID) | ORAL | Status: DC
  Administered 2014-12-11 – 2014-12-14 (×6): 100 mg via ORAL
  Filled 2014-12-11 (×6): qty 1

## 2014-12-11 MED ORDER — ALBUTEROL SULFATE HFA 108 (90 BASE) MCG/ACT IN AERS
INHALATION_SPRAY | RESPIRATORY_TRACT | Status: DC | PRN
  Administered 2014-12-11: 4 via RESPIRATORY_TRACT

## 2014-12-11 MED ORDER — EPHEDRINE SULFATE 50 MG/ML IJ SOLN
INTRAMUSCULAR | Status: DC | PRN
  Administered 2014-12-11 (×3): 5 mg via INTRAVENOUS

## 2014-12-11 MED ORDER — INSULIN GLARGINE 100 UNIT/ML ~~LOC~~ SOLN
10.0000 [IU] | Freq: Every day | SUBCUTANEOUS | Status: DC
  Administered 2014-12-11 – 2014-12-14 (×4): 10 [IU] via SUBCUTANEOUS
  Filled 2014-12-11 (×4): qty 0.1

## 2014-12-11 MED ORDER — SERTRALINE HCL 100 MG PO TABS
100.0000 mg | ORAL_TABLET | Freq: Every day | ORAL | Status: DC
  Administered 2014-12-11 – 2014-12-14 (×4): 100 mg via ORAL
  Filled 2014-12-11 (×4): qty 1

## 2014-12-11 MED ORDER — BUPIVACAINE LIPOSOME 1.3 % IJ SUSP
INTRAMUSCULAR | Status: DC | PRN
  Administered 2014-12-11: 20 mL

## 2014-12-11 MED ORDER — HYDROMORPHONE HCL 1 MG/ML IJ SOLN
0.5000 mg | INTRAMUSCULAR | Status: AC | PRN
  Administered 2014-12-11 (×4): 0.5 mg via INTRAVENOUS

## 2014-12-11 MED ORDER — METOCLOPRAMIDE HCL 10 MG PO TABS: 5.0000 mg | ORAL_TABLET | Freq: Three times a day (TID) | ORAL | Status: DC | PRN

## 2014-12-11 MED ORDER — BUPIVACAINE-EPINEPHRINE (PF) 0.5% -1:200000 IJ SOLN
INTRAMUSCULAR | Status: DC | PRN
  Administered 2014-12-11: 30 mL via PERINEURAL

## 2014-12-11 MED ORDER — FENTANYL CITRATE 0.05 MG/ML IJ SOLN
INTRAMUSCULAR | Status: AC
  Filled 2014-12-11: qty 5

## 2014-12-11 MED ORDER — BISACODYL 5 MG PO TBEC: 5.0000 mg | DELAYED_RELEASE_TABLET | Freq: Every day | ORAL | Status: DC | PRN

## 2014-12-11 MED ORDER — ONDANSETRON HCL 4 MG/2ML IJ SOLN
4.0000 mg | Freq: Four times a day (QID) | INTRAMUSCULAR | Status: DC | PRN
  Administered 2014-12-14: 4 mg via INTRAVENOUS
  Filled 2014-12-11: qty 2

## 2014-12-11 MED ORDER — EPHEDRINE SULFATE 50 MG/ML IJ SOLN
INTRAMUSCULAR | Status: AC
  Filled 2014-12-11: qty 1

## 2014-12-11 MED ORDER — LACTATED RINGERS IV SOLN
INTRAVENOUS | Status: DC | PRN
  Administered 2014-12-11 (×2): via INTRAVENOUS

## 2014-12-11 MED ORDER — GABAPENTIN 300 MG PO CAPS
600.0000 mg | ORAL_CAPSULE | Freq: Two times a day (BID) | ORAL | Status: DC
  Administered 2014-12-11 – 2014-12-14 (×6): 600 mg via ORAL
  Filled 2014-12-11 (×6): qty 2

## 2014-12-11 MED ORDER — TRANEXAMIC ACID 100 MG/ML IV SOLN
2000.0000 mg | Freq: Once | INTRAVENOUS | Status: DC
  Filled 2014-12-11: qty 20

## 2014-12-11 MED ORDER — MIDAZOLAM HCL 2 MG/2ML IJ SOLN
1.0000 mg | Freq: Once | INTRAMUSCULAR | Status: AC
  Administered 2014-12-11: 1 mg via INTRAVENOUS

## 2014-12-11 MED ORDER — ONDANSETRON HCL 4 MG/2ML IJ SOLN
INTRAMUSCULAR | Status: DC | PRN
  Administered 2014-12-11 (×2): 4 mg via INTRAVENOUS

## 2014-12-11 MED ORDER — METFORMIN HCL 500 MG PO TABS
750.0000 mg | ORAL_TABLET | Freq: Two times a day (BID) | ORAL | Status: DC
  Administered 2014-12-12 – 2014-12-14 (×5): 750 mg via ORAL
  Filled 2014-12-11 (×5): qty 2

## 2014-12-11 MED ORDER — FENTANYL CITRATE 0.05 MG/ML IJ SOLN
INTRAMUSCULAR | Status: AC
  Administered 2014-12-11: 50 ug via INTRAVENOUS
  Filled 2014-12-11: qty 2

## 2014-12-11 MED ORDER — SCOPOLAMINE 1 MG/3DAYS TD PT72
1.0000 | MEDICATED_PATCH | TRANSDERMAL | Status: DC
  Administered 2014-12-11: 1.5 mg via TRANSDERMAL
  Filled 2014-12-11: qty 1

## 2014-12-11 MED ORDER — SENNOSIDES-DOCUSATE SODIUM 8.6-50 MG PO TABS: 1.0000 | ORAL_TABLET | Freq: Every evening | ORAL | Status: DC | PRN

## 2014-12-11 MED ORDER — ACETAMINOPHEN 650 MG RE SUPP: 650.0000 mg | Freq: Four times a day (QID) | RECTAL | Status: DC | PRN

## 2014-12-11 MED ORDER — HYDROMORPHONE HCL 1 MG/ML IJ SOLN
INTRAMUSCULAR | Status: AC
  Administered 2014-12-12: 1 mg
  Filled 2014-12-11: qty 1

## 2014-12-11 MED ORDER — PHENOL 1.4 % MT LIQD: 1.0000 | OROMUCOSAL | Status: DC | PRN

## 2014-12-11 MED ORDER — CHLORTHALIDONE 25 MG PO TABS
25.0000 mg | ORAL_TABLET | Freq: Every day | ORAL | Status: DC
  Administered 2014-12-12 – 2014-12-14 (×3): 25 mg via ORAL
  Filled 2014-12-11 (×4): qty 1

## 2014-12-11 MED ORDER — CEFUROXIME SODIUM 1.5 G IJ SOLR
INTRAMUSCULAR | Status: AC
  Filled 2014-12-11: qty 1.5

## 2014-12-11 MED ORDER — SODIUM CHLORIDE 0.9 % IR SOLN
Status: DC | PRN
  Administered 2014-12-11: 3000 mL
  Administered 2014-12-11: 1000 mL

## 2014-12-11 MED ORDER — INSULIN ASPART 100 UNIT/ML ~~LOC~~ SOLN
0.0000 [IU] | Freq: Three times a day (TID) | SUBCUTANEOUS | Status: DC
  Administered 2014-12-12: 3 [IU] via SUBCUTANEOUS
  Administered 2014-12-12: 2 [IU] via SUBCUTANEOUS
  Administered 2014-12-12: 5 [IU] via SUBCUTANEOUS
  Administered 2014-12-13 (×2): 3 [IU] via SUBCUTANEOUS
  Administered 2014-12-14: 2 [IU] via SUBCUTANEOUS

## 2014-12-11 MED ORDER — ACETAMINOPHEN 325 MG PO TABS: 650.0000 mg | ORAL_TABLET | Freq: Four times a day (QID) | ORAL | Status: DC | PRN

## 2014-12-11 MED ORDER — HYDROMORPHONE HCL 1 MG/ML IJ SOLN
1.0000 mg | INTRAMUSCULAR | Status: DC | PRN
  Administered 2014-12-12 – 2014-12-14 (×10): 1 mg via INTRAVENOUS
  Filled 2014-12-11 (×10): qty 1

## 2014-12-11 SURGICAL SUPPLY — 61 items
BANDAGE ELASTIC 6 VELCRO ST LF (GAUZE/BANDAGES/DRESSINGS) ×3 IMPLANT
BANDAGE ESMARK 6X9 LF (GAUZE/BANDAGES/DRESSINGS) ×1 IMPLANT
BLADE SAG 18X100X1.27 (BLADE) ×3 IMPLANT
BLADE SAW SGTL 13X75X1.27 (BLADE) ×3 IMPLANT
BLADE SURG ROTATE 9660 (MISCELLANEOUS) IMPLANT
BNDG ELASTIC 6X10 VLCR STRL LF (GAUZE/BANDAGES/DRESSINGS) ×3 IMPLANT
BNDG ESMARK 6X9 LF (GAUZE/BANDAGES/DRESSINGS) ×3
BOWL SMART MIX CTS (DISPOSABLE) ×3 IMPLANT
CAPT KNEE TOTAL 3 ATTUNE ×3 IMPLANT
CEMENT HV SMART SET (Cement) ×6 IMPLANT
COVER SURGICAL LIGHT HANDLE (MISCELLANEOUS) ×3 IMPLANT
CUFF TOURNIQUET SINGLE 34IN LL (TOURNIQUET CUFF) ×3 IMPLANT
CUFF TOURNIQUET SINGLE 44IN (TOURNIQUET CUFF) IMPLANT
DRAPE EXTREMITY T 121X128X90 (DRAPE) ×3 IMPLANT
DRAPE IMP U-DRAPE 54X76 (DRAPES) ×3 IMPLANT
DRAPE U-SHAPE 47X51 STRL (DRAPES) ×3 IMPLANT
DURAPREP 26ML APPLICATOR (WOUND CARE) ×6 IMPLANT
ELECT REM PT RETURN 9FT ADLT (ELECTROSURGICAL) ×3
ELECTRODE REM PT RTRN 9FT ADLT (ELECTROSURGICAL) ×1 IMPLANT
EVACUATOR 1/8 PVC DRAIN (DRAIN) ×3 IMPLANT
GAUZE SPONGE 4X4 12PLY STRL (GAUZE/BANDAGES/DRESSINGS) ×6 IMPLANT
GAUZE XEROFORM 1X8 LF (GAUZE/BANDAGES/DRESSINGS) ×3 IMPLANT
GLOVE BIO SURGEON STRL SZ7.5 (GLOVE) ×3 IMPLANT
GLOVE BIO SURGEON STRL SZ8.5 (GLOVE) ×3 IMPLANT
GLOVE BIOGEL PI IND STRL 8 (GLOVE) ×1 IMPLANT
GLOVE BIOGEL PI IND STRL 9 (GLOVE) ×1 IMPLANT
GLOVE BIOGEL PI INDICATOR 8 (GLOVE) ×2
GLOVE BIOGEL PI INDICATOR 9 (GLOVE) ×2
GOWN STRL REUS W/ TWL LRG LVL3 (GOWN DISPOSABLE) ×1 IMPLANT
GOWN STRL REUS W/ TWL XL LVL3 (GOWN DISPOSABLE) ×2 IMPLANT
GOWN STRL REUS W/TWL LRG LVL3 (GOWN DISPOSABLE) ×2
GOWN STRL REUS W/TWL XL LVL3 (GOWN DISPOSABLE) ×4
HANDPIECE INTERPULSE COAX TIP (DISPOSABLE) ×2
HOOD PEEL AWAY FACE SHEILD DIS (HOOD) ×6 IMPLANT
KIT BASIN OR (CUSTOM PROCEDURE TRAY) ×3 IMPLANT
KIT ROOM TURNOVER OR (KITS) ×3 IMPLANT
MANIFOLD NEPTUNE II (INSTRUMENTS) ×3 IMPLANT
NDL SAFETY ECLIPSE 18X1.5 (NEEDLE) IMPLANT
NEEDLE 22X1 1/2 (OR ONLY) (NEEDLE) ×3 IMPLANT
NEEDLE HYPO 18GX1.5 SHARP (NEEDLE)
NEEDLE SPNL 18GX3.5 QUINCKE PK (NEEDLE) IMPLANT
NS IRRIG 1000ML POUR BTL (IV SOLUTION) ×3 IMPLANT
PACK TOTAL JOINT (CUSTOM PROCEDURE TRAY) ×3 IMPLANT
PACK UNIVERSAL I (CUSTOM PROCEDURE TRAY) ×3 IMPLANT
PAD ARMBOARD 7.5X6 YLW CONV (MISCELLANEOUS) ×6 IMPLANT
PADDING CAST COTTON 6X4 STRL (CAST SUPPLIES) ×3 IMPLANT
SET HNDPC FAN SPRY TIP SCT (DISPOSABLE) ×1 IMPLANT
SPONGE GAUZE 4X4 12PLY STER LF (GAUZE/BANDAGES/DRESSINGS) ×3 IMPLANT
STAPLER VISISTAT 35W (STAPLE) ×3 IMPLANT
SUCTION FRAZIER TIP 10 FR DISP (SUCTIONS) ×3 IMPLANT
SUT VIC AB 0 CT1 27 (SUTURE) ×2
SUT VIC AB 0 CT1 27XBRD ANBCTR (SUTURE) ×1 IMPLANT
SUT VIC AB 1 CTX 36 (SUTURE) ×2
SUT VIC AB 1 CTX36XBRD ANBCTR (SUTURE) ×1 IMPLANT
SUT VIC AB 2-0 CT1 27 (SUTURE) ×2
SUT VIC AB 2-0 CT1 TAPERPNT 27 (SUTURE) ×1 IMPLANT
SYR 30ML LL (SYRINGE) ×3 IMPLANT
SYR 50ML LL SCALE MARK (SYRINGE) ×3 IMPLANT
TOWEL OR 17X24 6PK STRL BLUE (TOWEL DISPOSABLE) ×3 IMPLANT
TOWEL OR 17X26 10 PK STRL BLUE (TOWEL DISPOSABLE) ×3 IMPLANT
WATER STERILE IRR 1000ML POUR (IV SOLUTION) ×6 IMPLANT

## 2014-12-11 NOTE — Progress Notes (Signed)
Patient reports having a tablet, wallet with debit with no money, cell phone with charger, dentures upper and lower, and a large bag. Offered to send valuables to security patient refused. Notified patient that [facility] could not be responsible for belongings if not sent to security.

## 2014-12-11 NOTE — Evaluation (Signed)
Physical Therapy Evaluation Patient Details Name: Angel Phillips MRN: [id number] DOB: 1960/10/21 Today's Date: 12/11/2014   History of Present Illness  Patient is a 54 yo male admitted 12/11/14 now s/p Lt TKA.  PMH:  ETOH abuse, depression, OSA, HTN, DM, arthritis  Clinical Impression  Patient presents with problems listed below.  Will benefit from acute PT to maximize independence prior to discharge.  Recommend ST-SNF for continued therapy prior to return home alone.    Follow Up Recommendations SNF;Supervision/Assistance - 24 hour    Equipment Recommendations  Rolling walker with 5" wheels;3in1 (PT)    Recommendations for Other Services       Precautions / Restrictions Precautions Precautions: Knee;Fall Precaution Booklet Issued: Yes (comment) Precaution Comments: Reviewed precautions with patient. Restrictions Weight Bearing Restrictions: Yes LLE Weight Bearing: Weight bearing as tolerated      Mobility  Bed Mobility Overal bed mobility: Needs Assistance Bed Mobility: Supine to Sit     Supine to sit: Min assist     General bed mobility comments: Verbal cues for technique.  Assist to bring LLE off of bed.  Good sitting balance.  Transfers Overall transfer level: Needs assistance Equipment used: Rolling walker (2 wheeled) Transfers: Sit to/from [employer] Sit to Stand: Mod assist Stand pivot transfers: Mod assist       General transfer comment: Verbal cues for hand placement and technique.  Patient required mod assist to power up to standing from bed and from chair.  Patient able to take several steps to pivot to chair.  Noted Lt knee buckling, requiring mod assist with transfer.  Assist to control descent into chair.  (Patient had BM in the bed without knowing.  Stood patient from chair to clean and change linens/gown.)  Ambulation/Gait                Information systems manager Rankin (Stroke Patients  Only)       Balance Overall balance assessment: Needs assistance         Standing balance support: Bilateral upper extremity supported Standing balance-Leahy Scale: Poor                               Pertinent Vitals/Pain Pain Assessment: 0-10 Pain Score: 8  Pain Location: Lt knee Pain Descriptors / Indicators: Aching;Sore Pain Intervention(s): Limited activity within patient's tolerance;Premedicated before session;Repositioned    Home Living Family/patient expects to be discharged to:: Skilled nursing facility Living Arrangements: Alone             Home Equipment: None Additional Comments: Patient lives in second floor apartment.    Prior Function Level of Independence: Independent               Hand Dominance        Extremity/Trunk Assessment   Upper Extremity Assessment: Overall WFL for tasks assessed           Lower Extremity Assessment: Generalized weakness;LLE deficits/detail   LLE Deficits / Details: Decreased strength and ROM post-op     Communication   Communication: No difficulties  Cognition Arousal/Alertness: Awake/alert Behavior During Therapy: WFL for tasks assessed/performed Overall Cognitive Status: Within Functional Limits for tasks assessed                      General Comments      Exercises Total Joint  Exercises Ankle Circles/Pumps: AROM;Both;10 reps;Seated      Assessment/Plan    PT Assessment Patient needs continued PT services  PT Diagnosis Difficulty walking;Generalized weakness;Acute pain   PT Problem List Decreased strength;Decreased range of motion;Decreased activity tolerance;Decreased balance;Decreased mobility;Decreased knowledge of use of DME;Decreased knowledge of precautions;Obesity;Pain  PT Treatment Interventions DME instruction;Gait training;Functional mobility training;Therapeutic activities;Therapeutic exercise;Patient/family education   PT Goals (Current goals can be found in  the Care Plan section) Acute Rehab PT Goals Patient Stated Goal: To get stronger PT Goal Formulation: With patient Time For Goal Achievement: 12/18/14 Potential to Achieve Goals: Good    Frequency 7X/week   Barriers to discharge Inaccessible home environment;Decreased caregiver support Patient lives in second floor apartment alone.    Co-evaluation               End of Session Equipment Utilized During Treatment: Gait belt Activity Tolerance: Patient limited by pain;Patient limited by fatigue Patient left: in chair;with call bell/phone within reach Nurse Communication: Mobility status (Use +2 for transfers due to knee buckling)         Time: 2027-2050 PT Time Calculation (min) (ACUTE ONLY): 23 min   Charges:   PT Evaluation $Initial PT Evaluation Tier I: 1 Procedure PT Treatments $Therapeutic Activity: 8-22 mins   PT G Codes:        Vena AustriaDavis, Preslei Blakley H 12/11/2014, 9:08 PM Durenda HurtSusan H. Renaldo Fiddleravis, PT, [facility] Acute Rehab Services Pager [phone]

## 2014-12-11 NOTE — Op Note (Signed)
PATIENT ID:      Freddi Starrhomas Walther  MRN:     [id number] DOB/AGE:    1961-08-14 / 54 y.o.       OPERATIVE REPORT    DATE OF PROCEDURE:  12/11/2014       PREOPERATIVE DIAGNOSIS:   LEFT KNEE OSTEOARTHRITIS      Estimated body mass index is 35.44 kg/(m^2) as calculated from the following:   Height as of 12/06/14: 5\' 11"  (1.803 m).   Weight as of this encounter: 115.214 kg (254 lb).                                                        POSTOPERATIVE DIAGNOSIS:   LEFT KNEE OSTEOARTHRITIS                                                                      PROCEDURE:  Procedure(s): TOTAL KNEE ARTHROPLASTY Using DepuyAttune RP implants #9L Femur, #9Tibia, 5mm Attune RP bearing, 41 Patella     SURGEON: Alania Overholt J    ASSISTANT:   Eric K. [employer]iant EnergyPhillips PA-C   (Present and scrubbed throughout the case, critical for assistance with exposure, retraction, instrumentation, and closure.)         ANESTHESIA: GET, ACB, Exparel  DRAINS: 2 medium hemovac in knee   TOURNIQUET TIME: 14min   COMPLICATIONS:  None     SPECIMENS: None   INDICATIONS FOR PROCEDURE: The patient has  LEFT KNEE OSTEOARTHRITIS, varus deformities, XR shows bone on bone arthritis. Patient has failed all conservative measures including anti-inflammatory medicines, narcotics, attempts at  exercise and weight loss, cortisone injections and viscosupplementation.  Risks and benefits of surgery have been discussed, questions answered.   DESCRIPTION OF PROCEDURE: The patient identified by armband, received  IV antibiotics[facility] Patient taken to the operating room, appropriate anesthetic  monitors were attached, and general endotracheal anesthesia induced with  the patient in supine position, Foley catheter was inserted. Tourniquet  applied high to the operative thigh. Lateral post and foot positioner  applied to the table, the lower extremity was then prepped and draped  in usual sterile fashion from the  ankle to the tourniquet. Time-out procedure was performed.  We began the operation by making the anterior midline incision starting at handbreadth above the patella going over the patella 1 cm medial to and  4 cm distal to the tibial tubercle. Small bleeders in the skin and the  subcutaneous tissue identified and cauterized. Transverse retinaculum was incised and reflected medially and a medial parapatellar arthrotomy was accomplished. the patella was everted and theprepatellar fat pad resected. The superficial medial collateral  ligament was then elevated from anterior to posterior along the proximal  flare of the tibia and anterior half of the menisci resected. The knee was hyperflexed exposing bone on bone arthritis. Peripheral and notch osteophytes as well as the cruciate ligaments were then resected. We continued to  work our way around posteriorly along the proximal tibia, and externally  rotated the tibia subluxing it out from underneath the femur.  A McHale  retractor was placed through the notch and a lateral Hohmann retractor  placed, and we then drilled through the proximal tibia in line with the  axis of the tibia followed by an intramedullary guide rod and 2-degree  posterior slope cutting guide. The tibial cutting guide was pinned into place  allowing resection of 5 mm of bone medially and about 10 mm of bone  laterally because of her varus deformity. Satisfied with the tibial resection, we then  entered the distal femur 2 mm anterior to the PCL origin with the  intramedullary guide rod and applied the distal femoral cutting guide  set at 11mm, with 5 degrees of valgus. This was pinned along the  epicondylar axis. At this point, the distal femoral cut was accomplished without difficulty. We then sized for a #9L femoral component and pinned the guide in 3 degrees of external rotation.The chamfer cutting guide was pinned into place. The anterior, posterior, and chamfer cuts were  accomplished without difficulty followed by  the Attune RP box cutting guide and the box cut. We also removed posterior osteophytes from the posterior femoral condyles. At this  time, the knee was brought into full extension. We checked our  extension and flexion gaps and found them symmetric at 10mm.  The patella thickness measured at 25 mm. We set the cutting guide at 15 and removed the posterior 10 mm  of the patella, sized for a 41 button and drilled the lollipop. The knee  was then once again hyperflexed exposing the proximal tibia. We sized for a #9 tibial base plate, applied the smokestack and the conical reamer followed by the the Delta fin keel punch. We then hammered into place the Attune RP trial femoral component, inserted a 10-mm trial bearing, trial patellar button, and took the knee through range of motion from 0-130 degrees. No thumb pressure was required for patellar  Tracking. Topical tranexamic acid was placed in the wound using a soaked sponge. The limb was wrapped with an Esmarch bandage and the tourniquet inflated to 350 mmHg. At this point, all trial components were removed, a double batch of DePuy HV cement with 1500 mg of Zinacef was mixed and applied to all bony metallic mating surfaces except for the posterior condyles of the femur itself. In order, we  hammered into place the tibial tray and removed excess cement, the femoral component and removed excess cement, a 10-mm Sigma RP bearing  was inserted, and the knee brought to full extension with compression.  The patellar button was clamped into place, and excess cement  removed. While the cement cured the wound was irrigated out with normal saline solution pulse lavage, and medium Hemovac drains were placed from an anterolateral  approach. Ligament stability and patellar tracking were checked and found to be excellent. The parapatellar arthrotomy was closed with  running #1 Vicryl suture. The subcutaneous tissue with 0 and  2-0 undyed  Vicryl suture, and the skin with skin staples. A dressing of Xeroform,  4 x 4, dressing sponges, Webril, and Ace wrap applied. The patient  awakened, extubated, and taken to recovery room without difficulty.   Vastie Douty J 12/11/2014, 11:54 AM

## 2014-12-11 NOTE — Interval H&P Note (Signed)
History and Physical Interval Note:  12/11/2014 9:30 AM  Angel Phillips  has presented today for surgery, with the diagnosis of LEFT KNEE OSTEOARTHRITIS  The various methods of treatment have been discussed with the patient and family. After consideration of risks, benefits and other options for treatment, the patient has consented to  Procedure(s): TOTAL KNEE ARTHROPLASTY (Left) as a surgical intervention .  The patient's history has been reviewed, patient examined, no change in status, stable for surgery.  I have reviewed the patient's chart and labs.  Questions were answered to the patient's satisfaction.     Nestor LewandowskyOWAN,Emrys Mceachron J

## 2014-12-11 NOTE — Anesthesia Procedure Notes (Addendum)
Anesthesia Regional Block:  Femoral nerve block  Pre-Anesthetic Checklist: ,, timeout performed, Correct Patient, Correct Site, Correct Laterality, Correct Procedure, Correct Position, site marked, Risks and benefits discussed,  Surgical consent,  Pre-op evaluation,  At surgeon's request and post-op pain management  Laterality: Left and Lower  Prep: Maximum Sterile Barrier Precautions used and chloraprep       Needles:  Injection technique: Single-shot  Needle Type: Echogenic Stimulator Needle     Needle Length: 10cm 10 cm Needle Gauge: 21 and 21 G    Additional Needles:  Procedures: ultrasound guided (picture in chart) and nerve stimulator Femoral nerve block  Nerve Stimulator or Paresthesia:  Response: 0.4 mA,   Additional Responses:   Narrative:  Start time: 12/11/2014 9:30 AM End time: 12/11/2014 9:40 AM Injection made incrementally with aspirations every 5 mL.  Performed by: Personally  Anesthesiologist: Sebastian AcheMANNY, THEODORE  Additional Notes: L femoral nerve block, 30ml .5% marcaine with epi, multiple asp, talked to patient throughout, no complications, US and stim   Procedure Name: Intubation Date/Time: 12/11/2014 10:27 AM Performed by: Wray KearnsFOLEY, Angel Phillips Pre-anesthesia Checklist: Patient identified, Emergency Drugs available, Suction available, Patient being monitored and Timeout performed Patient Re-evaluated:Patient Re-evaluated prior to inductionOxygen Delivery Method: Circle system utilized Preoxygenation: Pre-oxygenation with 100% oxygen Intubation Type: IV induction, Rapid sequence and Cricoid Pressure applied Laryngoscope Size: Mac and 4 Grade View: Grade I Tube type: Oral Tube size: 7.5 mm Number of attempts: 1 Airway Equipment and Method: Stylet Placement Confirmation: ETT inserted through vocal cords under direct vision,  breath sounds checked- equal and bilateral and positive ETCO2 Secured at: 23 cm Tube secured with: Tape Dental Injury: Teeth and  Oropharynx as per pre-operative assessment

## 2014-12-11 NOTE — Progress Notes (Signed)
Orthopedic Tech Progress Note Patient Details:  Angel Phillips 1961-09-24 [id number]  CPM Left Knee CPM Left Knee: On Left Knee Flexion (Degrees): 60 Left Knee Extension (Degrees): 0 Additional Comments: trapeze bar patient helper Viewed order from doctor's order list  Nikki DomCrawford, Kariyah Baugh 12/11/2014, 1:20 PM

## 2014-12-11 NOTE — Progress Notes (Signed)
Utilization review completed.  

## 2014-12-11 NOTE — Progress Notes (Signed)
Patient monitored during block. Patient maintained NSR on monitor rate 70- 74. SpO2 96 -100, ETCO2 26- 31. RR 12-16. BP 153/69

## 2014-12-11 NOTE — Anesthesia Postprocedure Evaluation (Signed)
  Anesthesia Post-op Note  Patient: Angel Phillips  Procedure(s) Performed: Procedure(s): TOTAL KNEE ARTHROPLASTY (Left)  Patient Location: PACU  Anesthesia Type:General  Level of Consciousness: awake and alert   Airway and Oxygen Therapy: Patient Spontanous Breathing and Patient connected to nasal cannula oxygen  Post-op Pain: mild  Post-op Assessment: Post-op Vital signs reviewed and Patient's Cardiovascular Status Stable  Post-op Vital Signs: Reviewed and stable  Last Vitals:  Filed Vitals:   12/11/14 0953  BP: 151/68  Pulse: 80  Temp:   Resp: 16    Complications: No apparent anesthesia complications

## 2014-12-11 NOTE — Transfer of Care (Signed)
Immediate Anesthesia Transfer of Care Note  Patient: Angel Phillips  Procedure(s) Performed: Procedure(s): TOTAL KNEE ARTHROPLASTY (Left)  Patient Location: PACU  Anesthesia Type:GA combined with regional for post-op pain  Level of Consciousness: oriented, sedated, patient cooperative and responds to stimulation  Airway & Oxygen Therapy: Patient Spontanous Breathing and Patient connected to face mask oxygen  Post-op Assessment: Report given to RN, Post -op Vital signs reviewed and stable, Patient moving all extremities and Patient moving all extremities X 4  Post vital signs: Reviewed and stable  Last Vitals:  Filed Vitals:   12/11/14 0953  BP: 151/68  Pulse: 80  Temp:   Resp: 16    Complications: No apparent anesthesia complications

## 2014-12-12 ENCOUNTER — Encounter (HOSPITAL_COMMUNITY): Payer: Self-pay | Admitting: Orthopedic Surgery

## 2014-12-12 LAB — CBC
HEMATOCRIT: 30.9 % — AB (ref 39.0–52.0)
Hemoglobin: 10 g/dL — ABNORMAL LOW (ref 13.0–17.0)
MCH: 27 pg (ref 26.0–34.0)
MCHC: 32.4 g/dL (ref 30.0–36.0)
MCV: 83.3 fL (ref 78.0–100.0)
Platelets: 268 10*3/uL (ref 150–400)
RBC: 3.71 MIL/uL — AB (ref 4.22–5.81)
RDW: 13.9 % (ref 11.5–15.5)
WBC: 7.1 10*3/uL (ref 4.0–10.5)

## 2014-12-12 LAB — GLUCOSE, CAPILLARY
GLUCOSE-CAPILLARY: 211 mg/dL — AB (ref 70–99)
Glucose-Capillary: 180 mg/dL — ABNORMAL HIGH (ref 70–99)
Glucose-Capillary: 133 mg/dL — ABNORMAL HIGH (ref 70–99)
Glucose-Capillary: 70 mg/dL (ref 70–99)

## 2014-12-12 MED ORDER — PNEUMOCOCCAL VAC POLYVALENT 25 MCG/0.5ML IJ INJ: 0.5000 mL | INJECTION | INTRAMUSCULAR | Status: DC

## 2014-12-12 NOTE — Progress Notes (Signed)
Physical Therapy Treatment Patient Details Name: Angel Phillips MRN: [id number] DOB: 01/15/1961 Today's Date: 12/12/2014    History of Present Illness Patient is a 54 yo male admitted 12/11/14 now s/p Lt TKA.  PMH:  ETOH abuse, depression, OSA, HTN, DM, arthritis    PT Comments    Session focused on safety with upright activity and activating L quad for stance stability; Still with buckling L knee in stance, likely due to pain  Follow Up Recommendations  SNF;Supervision/Assistance - 24 hour     Equipment Recommendations  Rolling walker with 5" wheels;3in1 (PT)    Recommendations for Other Services       Precautions / Restrictions Precautions Precautions: Knee;Fall Precaution Booklet Issued: Yes (comment) Precaution Comments: Reviewed precautions with patient. Restrictions LLE Weight Bearing: Weight bearing as tolerated    Mobility  Bed Mobility                  Transfers Overall transfer level: Needs assistance Equipment used: Rolling walker (2 wheeled) Transfers: Sit to/from Stand Sit to Stand: Mod assist         General transfer comment: Cues for safety and hand placement; light mod assist for steadiness and close guard for L knee buckle, which continued in this session  Ambulation/Gait             General Gait Details: Held progressive amb due to pain   Stairs            Wheelchair Mobility    Modified Rankin (Stroke Patients Only)       Balance Overall balance assessment: Needs assistance         Standing balance support: Bilateral upper extremity supported Standing balance-Leahy Scale: Poor Standing balance comment: Worked on quad activation for L stance stability while stepping R foot forward and backward; Buckling stil noted, but pt able to maintain balance with bil UE suport on RW                    Cognition Arousal/Alertness: Awake/alert Behavior During Therapy: WFL for tasks assessed/performed Overall  Cognitive Status: Within Functional Limits for tasks assessed                      Exercises      General Comments        Pertinent Vitals/Pain Pain Assessment: 0-10 Pain Score: 8  Pain Location: L knee Pain Descriptors / Indicators: Aching;Grimacing Pain Intervention(s): Limited activity within patient's tolerance;Monitored during session;Patient requesting pain meds-RN notified    Home Living                      Prior Function            PT Goals (current goals can now be found in the care plan section) Acute Rehab PT Goals Patient Stated Goal: To get stronger PT Goal Formulation: With patient Time For Goal Achievement: 12/18/14 Potential to Achieve Goals: Good Progress towards PT goals: Progressing toward goals    Frequency  7X/week    PT Plan Current plan remains appropriate    Co-evaluation             End of Session Equipment Utilized During Treatment: Gait belt Activity Tolerance: Patient limited by pain Patient left: in chair;with call bell/phone within reach     Time: 4098-1191 PT Time Calculation (min) (ACUTE ONLY): 16 min  Charges:  $Therapeutic Exercise: 8-22 mins  G Codes:      [name], [name] 12/12/2014, 3:56 PM  [name], [place]PT  Acute Rehabilitation Services Pager [phone] Office [phone]

## 2014-12-12 NOTE — Clinical Social Work Placement (Cosign Needed)
Clinical Social Work Department CLINICAL SOCIAL WORK PLACEMENT NOTE 12/12/2014  Patient:  Angel Phillips,Angel Phillips  Account Number:  [id number] Admit date:  12/11/2014  Clinical Social Worker:  Hortencia PilarKIERRA Tylisha Danis, CLINICAL SOCIAL WORKER  Date/time:  12/12/2014 12:17 PM  Clinical Social Work is seeking post-discharge placement for this patient at the following level of care:   SKILLED NURSING   (*CSW will update this form in Epic as items are completed)   12/12/2014  Patient/family provided with [facility] of Clinical Social Work's list of facilities offering this level of care within the geographic area requested by the patient (or if unable, by the patient's family).  12/12/2014  Patient/family informed of their freedom to choose among providers that offer the needed level of care, that participate in Medicare, Medicaid or managed care program needed by the patient, have an available bed and are willing to accept the patient.  12/12/2014  Patient/family informed of [facility]' ownership interest in [facility], as well as of the fact that they are under no obligation to receive care at this facility.  PASARR submitted to EDS on 12/12/2014 PASARR number received on 12/12/2014  FL2 transmitted to all facilities in geographic area requested by pt/family on   FL2 transmitted to all facilities within larger geographic area on   Patient informed that his/her managed care company has contracts with or will negotiate with  certain facilities, including the following:     Patient/family informed of bed offers received:   Patient chooses bed at  Physician recommends and patient chooses bed at    Patient to be transferred to  on   Patient to be transferred to facility by  Patient and family notified of transfer on  Name of family member notified:    The following physician request were entered in Epic:   Additional Comments:  Tivis RingerKierra S. Wily, BSW-Intern

## 2014-12-12 NOTE — Clinical Social Work Psychosocial (Cosign Needed)
Clinical Social Work Department BRIEF PSYCHOSOCIAL ASSESSMENT 12/12/2014  Patient:  Angel Phillips, Angel Phillips     Account Number:  [id number]     Admit date:  12/11/2014  Clinical Social Worker:  Durward Fortes, CLINICAL SOCIAL WORKER  Date/Time:  12/12/2014 12:05 PM  Referred by:  Physician  Date Referred:  12/12/2014 Referred for  SNF Placement   Other Referral:   none.   Interview type:  Patient Other interview type:   none.    PSYCHOSOCIAL DATA Living Status:  ALONE Admitted from facility:   Level of care:   Primary support name:  Idamae Lusher Primary support relationship to patient:  FRIEND Degree of support available:   Adequate support.    CURRENT CONCERNS Current Concerns  Post-Acute Placement   Other Concerns:   none.    SOCIAL WORK ASSESSMENT / PLAN CSW and BSW-Intern consulted regarding possible SNF placement for pt once medically stable for discharge.    BSW-Intern met with pt at bedside, where pt informed BSW-Intern that pt was currently living alone before being admitted for surgery in the hospital. BSW-Intern asked pt who would be helping pt once pt is medically stbale for disharge from SNF. Pt stated that pt's friend Idamae Lusher would be willing to assist in any of pt's needs.    Pt is very involved in pt's care. Pt presents no stressors as it relates to time of discharge. Pt looks foward to the return back home with family and friends.    CSW and BSW-Intern to continue to assist with discharge planning needs.   Assessment/plan status:  Psychosocial Support/Ongoing Assessment of Needs Other assessment/ plan:   none.   Information/referral to community resources:   [facility] once medically stable for discharge.    PATIENT'S/FAMILY'S RESPONSE TO PLAN OF CARE: Pt agreeable and understanding of CSW plan of care. Pt expressed no further questions or concerns at this time.       Virgie Dad Rhett Najera, BSW-Intern

## 2014-12-12 NOTE — Progress Notes (Signed)
OT Cancellation Note  Patient Details Name: Angel Phillips MRN: [id number] DOB: [birth year]   Cancelled Treatment:    Reason Eval/Treat Not Completed: Other (comment) Pt is Medicare and current D/C plan is SNF. No apparent immediate acute care OT needs, therefore will defer OT to SNF. If OT eval is needed please call Acute Rehab Dept. at [phone] or text page OT at [phone].    Evette GeorgesLeonard, Tyniya Kuyper Eva [phone] 12/12/2014, 7:23 AM

## 2014-12-12 NOTE — Progress Notes (Signed)
Orthopedic Tech Progress Note Patient Details:  Angel Phillips 1961-08-02 [id number] On cpm at 7:15 pm Patient ID: Angel Phillips, male   DOB: 1961-08-02, 54 y.o.   MRN: [id number]   [name], [name] Craig 12/12/2014, 7:14 PM

## 2014-12-12 NOTE — Progress Notes (Signed)
Physical Therapy Treatment Note  Clinical Impression: Noting very good gains in quad activation for L stance stability with much less knee buckling; Improving L knee extension against gravity; Overall progressing well; Anticipate continuing good progress at post-acute rehabilitation.     12/12/14 1600  PT Visit Information  Last PT Received On 12/12/14  Assistance Needed +1  History of Present Illness Patient is a 54 yo male admitted 12/11/14 now s/p Lt TKA.  PMH:  ETOH abuse, depression, OSA, HTN, DM, arthritis  PT Time Calculation  PT Start Time (ACUTE ONLY) 1446  PT Stop Time (ACUTE ONLY) 1514  PT Time Calculation (min) (ACUTE ONLY) 28 min  Subjective Data  Patient Stated Goal To get stronger  Precautions  Precautions Knee;Fall  Precaution Comments Reviewed precautions with patient.  Restrictions  LLE Weight Bearing WBAT  Pain Assessment  Pain Assessment 0-10  Pain Score 4  Pain Location L knee with therex  Pain Descriptors / Indicators Aching;Discomfort  Pain Intervention(s) Monitored during session;Repositioned  Cognition  Arousal/Alertness Awake/alert  Behavior During Therapy WFL for tasks assessed/performed  Overall Cognitive Status Within Functional Limits for tasks assessed  Bed Mobility  Overal bed mobility Needs Assistance  Bed Mobility Sit to Supine  Sit to supine Min guard  General bed mobility comments Cues for technique; Good knee control while raising LLE onto bed  Transfers  Overall transfer level Needs assistance  Equipment used Rolling walker (2 wheeled)  Transfers Sit to/from Stand  Sit to Stand Min guard  General transfer comment Cues for safety and hand placement; uncontrolled descent to sit   Ambulation/Gait  Ambulation/Gait assistance Min assist;+2 safety/equipment (chair pushed behind)  Ambulation Distance (Feet) 18 Feet  Assistive device Rolling walker (2 wheeled)  Gait Pattern/deviations Step-through pattern  General Gait Details Cues for  gait sequence and to activate quad for L stance stability; one instance fo knee buckle, but pt able to keep balance with UE support on RW  Balance  Standing balance support Bilateral upper extremity supported  Standing balance-Leahy Scale Poor  Exercises  Exercises Total Joint  Total Joint Exercises  Ankle Circles/Pumps AROM;Both;10 reps;Seated  Quad Sets AROM;Left;10 reps  Long [address]Arc [place];Left;10 reps  Knee Flexion AAROM;Left;10 reps;Seated  Goniometric ROM approx 0-75deg  PT - End of Session  Equipment Utilized During Treatment Gait belt  Activity Tolerance Patient tolerated treatment well  Patient left in bed;in CPM;with call bell/phone within reach  Nurse Communication Mobility status  PT - Assessment/Plan  PT Plan Current plan remains appropriate  PT Frequency (ACUTE ONLY) 7X/week  Follow Up Recommendations SNF;Supervision/Assistance - 24 hour  PT equipment Rolling walker with 5" wheels;3in1 (PT)  PT Goal Progression  Progress towards PT goals Progressing toward goals  Acute Rehab PT Goals  PT Goal Formulation With patient  Time For Goal Achievement 12/18/14  Potential to Achieve Goals Good  PT General Charges  $$ ACUTE PT VISIT 1 Procedure  PT Treatments  $Gait Training 8-22 mins  $Therapeutic Exercise 8-22 mins    Van ClinesHolly Rhyan Wolters, PT  Acute Rehabilitation Services Pager [phone] Office [phone]

## 2014-12-12 NOTE — Progress Notes (Signed)
Patient ID: Angel Phillips, male   DOB: 1961-02-12, 54 y.o.   MRN: [id number] PATIENT ID: Angel Phillips  MRN: [id number]  DOB/AGE:  1961-02-12 / 54 y.o.  1 Day Post-Op Procedure(s) (LRB): TOTAL KNEE ARTHROPLASTY (Left)    PROGRESS NOTE Subjective: Patient is alert, oriented, no Nausea, no Vomiting, yes passing gas, no Bowel Movement. Taking PO well. Denies SOB, Chest or Calf Pain. Using Incentive Spirometer, PAS in place. Ambulate WBAT, CPM 0-50 Patient reports pain as 5 on 0-10 scale  .    Objective: Vital signs in last 24 hours: Filed Vitals:   12/12/14 0000 12/12/14 0015 12/12/14 0400 12/12/14 0500  BP:  138/67  123/73  Pulse:  81  75  Temp:  98.5 F (36.9 C)  99.2 F (37.3 C)  TempSrc:      Resp: 17 16 16 16   Weight:      SpO2: 100% 97%  99%      Intake/Output from previous day: I/O last 3 completed shifts: In: 1700 [I.V.:1700] Out: 950 [Urine:650; Blood:300]   Intake/Output this shift:     LABORATORY DATA:  Recent Labs  12/11/14 2027 12/11/14 2218 12/12/14 0515 12/12/14 0622  WBC  --   --  7.1  --   HGB  --   --  10.0*  --   HCT  --   --  30.9*  --   PLT  --   --  268  --   GLUCAP 111* 140*  --  180*    Examination: Neurologically intact ABD soft Neurovascular intact Sensation intact distally Intact pulses distally Dorsiflexion/Plantar flexion intact Incision: dressing C/D/I No cellulitis present Compartment soft}  Assessment:   1 Day Post-Op Procedure(s) (LRB): TOTAL KNEE ARTHROPLASTY (Left) ADDITIONAL DIAGNOSIS: Expected Acute Blood Loss Anemia, Hx ETOH  Plan: PT/OT WBAT, CPM 5/hrs day until ROM 0-90 degrees, then D/C CPM DVT Prophylaxis:  SCDx72hrs, ASA 325 mg BID x 2 weeks DISCHARGE PLAN: Skilled Nursing Facility/Rehab DISCHARGE NEEDS: HHPT, HHRN, CPM, Walker and 3-in-1 comode seat     Freddie Dymek J 12/12/2014, 7:29 AM

## 2014-12-13 ENCOUNTER — Encounter (HOSPITAL_COMMUNITY): Payer: Self-pay | Admitting: General Practice

## 2014-12-13 LAB — GLUCOSE, CAPILLARY
Glucose-Capillary: 114 mg/dL — ABNORMAL HIGH (ref 70–99)
Glucose-Capillary: 173 mg/dL — ABNORMAL HIGH (ref 70–99)
Glucose-Capillary: 180 mg/dL — ABNORMAL HIGH (ref 70–99)
Glucose-Capillary: 139 mg/dL — ABNORMAL HIGH (ref 70–99)

## 2014-12-13 LAB — CBC
HCT: 31.2 % — ABNORMAL LOW (ref 39.0–52.0)
Hemoglobin: 10.2 g/dL — ABNORMAL LOW (ref 13.0–17.0)
MCH: 27.1 pg (ref 26.0–34.0)
MCHC: 32.7 g/dL (ref 30.0–36.0)
MCV: 83 fL (ref 78.0–100.0)
PLATELETS: 264 10*3/uL (ref 150–400)
RBC: 3.76 MIL/uL — ABNORMAL LOW (ref 4.22–5.81)
RDW: 13.8 % (ref 11.5–15.5)
WBC: 9.3 10*3/uL (ref 4.0–10.5)

## 2014-12-13 LAB — HEMOGLOBIN A1C
Hgb A1c MFr Bld: 10.8 % — ABNORMAL HIGH (ref 4.8–5.6)
Mean Plasma Glucose: 263 mg/dL

## 2014-12-13 NOTE — Progress Notes (Signed)
Physical Therapy Treatment Patient Details Name: Angel Phillips MRN: [id number] DOB: 1961-07-17 Today's Date: 12/13/2014    History of Present Illness Patient is a 54 yo male admitted 12/11/14 now s/p Lt TKA.  PMH:  ETOH abuse, depression, OSA, HTN, DM, arthritis    PT Comments    Pt increased ambulatory distance during this session but was limited 2/2 fatigue and pain in his L knee.  Pt is still appropriate for d/c to SNF as he does not have assistance available at home and has 22 steps at home.  Unable to assess stairs 2/2 pt's fatigue after ambulating short distance, not safe to assess at this time.  Pt is progressing toward PT goals and will benefit from further therapy to increase strength and independence w/ functional activities.   Follow Up Recommendations  SNF;Supervision/Assistance - 24 hour     Equipment Recommendations  Rolling walker with 5" wheels;3in1 (PT)    Recommendations for Other Services       Precautions / Restrictions Precautions Precautions: Knee;Fall Precaution Comments: Reviewed precautions with patient. Restrictions Weight Bearing Restrictions: Yes LLE Weight Bearing: Weight bearing as tolerated    Mobility  Bed Mobility                  Transfers Overall transfer level: Needs assistance Equipment used: Rolling walker (2 wheeled) Transfers: Sit to/from Stand Sit to Stand: Min guard         General transfer comment: Cues for safety and hand placement  Ambulation/Gait Ambulation/Gait assistance: Min guard Ambulation Distance (Feet): 40 Feet Assistive device: Rolling walker (2 wheeled) Gait Pattern/deviations: Step-to pattern;Antalgic;Decreased stride length;Decreased stance time - left;Trunk flexed Gait velocity: decreased Gait velocity interpretation: Below normal speed for age/gender General Gait Details: One instance of knee buckle but pt able to maintain balance w/ support of RW. Pt only required min verbal cues for standing  upright and cues to activate quad prior to stepping w/ LLE.  Pt verablizes to himself the proper sequencing while ambulating.   Stairs            Wheelchair Mobility    Modified Rankin (Stroke Patients Only)       Balance Overall balance assessment: Needs assistance Sitting-balance support: Feet supported;No upper extremity supported Sitting balance-Leahy Scale: Fair     Standing balance support: Bilateral upper extremity supported Standing balance-Leahy Scale: Fair                      Cognition Arousal/Alertness: Awake/alert Behavior During Therapy: WFL for tasks assessed/performed Overall Cognitive Status: Within Functional Limits for tasks assessed                      Exercises Total Joint Exercises Ankle Circles/Pumps: AROM;Both;10 reps;Seated Quad Sets: AROM;Left;10 reps;Seated Straight Leg Raises: AROM;Left;5 reps;Seated Knee Flexion: AROM;Left;5 reps;Seated Goniometric ROM: 9-81    General Comments General comments (skin integrity, edema, etc.): unable to assess stairs at this session 2/2 pt's limited ambulatory activity 2/2 fatigue      Pertinent Vitals/Pain Pain Assessment: 0-10 Pain Score: 6  Pain Location: L knee Pain Descriptors / Indicators: Aching;Grimacing;Guarding;Discomfort;Nagging Pain Intervention(s): Limited activity within patient's tolerance;Monitored during session;RN gave pain meds during session;Repositioned    Home Living                      Prior Function            PT Goals (current goals can now be found in  the care plan section) Acute Rehab PT Goals Patient Stated Goal: to walk to the bathroom PT Goal Formulation: With patient Time For Goal Achievement: 12/18/14 Potential to Achieve Goals: Good Progress towards PT goals: Progressing toward goals    Frequency  7X/week    PT Plan Current plan remains appropriate    Co-evaluation             End of Session Equipment Utilized During  Treatment: Gait belt Activity Tolerance: Patient limited by fatigue;Patient limited by pain Patient left: in chair;with call bell/phone within reach     Time: 1610-9604 PT Time Calculation (min) (ACUTE ONLY): 41 min  Charges:  $Gait Training: 8-22 mins $Therapeutic Exercise: 8-22 mins $Therapeutic Activity: 8-22 mins                    G CodesMichail Jewels PT, [place] [phone] 12/13/2014, 2:24 PM

## 2014-12-13 NOTE — Progress Notes (Signed)
PATIENT ID: Angel Phillips  MRN: [id number]  DOB/AGE:  1961/03/05 / 54 y.o.  2 Days Post-Op Procedure(s) (LRB): TOTAL KNEE ARTHROPLASTY (Left)    PROGRESS NOTE Subjective: Patient is alert, oriented, no Nausea, no Vomiting, yes passing gas, yes Bowel Movement. Taking PO Well. Denies SOB, Chest or Calf Pain. Using Incentive Spirometer, PAS in place. Ambulate WBAT, CPM 0-60 Patient reports pain as 7 on 0-10 scale  .    Objective: Vital signs in last 24 hours: Filed Vitals:   12/12/14 2124 12/13/14 0000 12/13/14 0400 12/13/14 0700  BP: 153/76   147/81  Pulse: 82   89  Temp: 99.2 F (37.3 C)   99.9 F (37.7 C)  TempSrc: Oral   Oral  Resp: 18 18 18 18   Weight:      SpO2: 98% 98% 98% 95%      Intake/Output from previous day: I/O last 3 completed shifts: In: 800 [P.O.:800] Out: 2150 [Urine:2150]   Intake/Output this shift:     LABORATORY DATA:  Recent Labs  12/12/14 0515  12/12/14 1641 12/12/14 2126 12/13/14 0700  WBC 7.1  --   --   --   --   HGB 10.0*  --   --   --   --   HCT 30.9*  --   --   --   --   PLT 268  --   --   --   --   GLUCAP  --   < > 133* 70 114*  < > = values in this interval not displayed.  Examination: Neurologically intact Neurovascular intact Sensation intact distally Intact pulses distally Dorsiflexion/Plantar flexion intact Incision: dressing C/D/I No cellulitis present Compartment soft}  Assessment:   2 Days Post-Op Procedure(s) (LRB): TOTAL KNEE ARTHROPLASTY (Left) ADDITIONAL DIAGNOSIS: Expected Acute Blood Loss Anemia, diabetes, HTN, depression  Plan: PT/OT WBAT, CPM 5/hrs day until ROM 0-90 degrees, then D/C CPM DVT Prophylaxis:  SCDx72hrs, ASA 325 mg BID x 2 weeks DISCHARGE PLAN: Home, if pt passes therapy goals. DISCHARGE NEEDS: HHPT, HHRN, CPM, Walker and 3-in-1 comode seat     Angel Phillips R 12/13/2014, 8:16 AM

## 2014-12-13 NOTE — Progress Notes (Signed)
Physical Therapy Treatment Patient Details Name: Angel Phillips Fillingim MRN: [id number] DOB: 07/17/1961 Today's Date: 12/13/2014    History of Present Illness Patient is a 54 yo male admitted 12/11/14 now s/p Lt TKA.  PMH:  ETOH abuse, depression, OSA, HTN, DM, arthritis    PT Comments    Pt demonstrates improved posture w/ ambulation w/o knee buckle and w/o verbal cues for proper sequencing this session.Pt is anticipating d/c to SNF tomorrow.    Follow Up Recommendations  SNF;Supervision/Assistance - 24 hour     Equipment Recommendations  Rolling walker with 5" wheels;3in1 (PT)    Recommendations for Other Services       Precautions / Restrictions Precautions Precautions: Knee;Fall Precaution Comments: Reviewed precautions with patient. Restrictions Weight Bearing Restrictions: Yes LLE Weight Bearing: Weight bearing as tolerated    Mobility  Bed Mobility Overal bed mobility: Needs Assistance Bed Mobility: Sit to Supine       Sit to supine: Min assist   General bed mobility comments: assist w/ bringing LLE into bed  Transfers Overall transfer level: Needs assistance Equipment used: Rolling walker (2 wheeled) Transfers: Sit to/from Stand Sit to Stand: Min guard         General transfer comment: Cues for safety and hand placement  Ambulation/Gait Ambulation/Gait assistance: Min guard Ambulation Distance (Feet): 20 Feet Assistive device: Rolling walker (2 wheeled) Gait Pattern/deviations: Step-to pattern;Decreased stride length;Decreased stance time - left;Antalgic Gait velocity: decreased Gait velocity interpretation: Below normal speed for age/gender General Gait Details: Ambulation improved this session w/o knee buckle and proper sequencing w/o verbal cues.     Stairs            Wheelchair Mobility    Modified Rankin (Stroke Patients Only)       Balance Overall balance assessment: Needs assistance Sitting-balance support: Feet supported;No upper  extremity supported Sitting balance-Leahy Scale: Fair     Standing balance support: Bilateral upper extremity supported Standing balance-Leahy Scale: Fair                      Cognition Arousal/Alertness: Awake/alert Behavior During Therapy: WFL for tasks assessed/performed Overall Cognitive Status: Within Functional Limits for tasks assessed                      Exercises Total Joint Exercises Ankle Circles/Pumps: AROM;Both;10 reps;Seated Quad Sets: AROM;Left;10 reps;Supine Heel Slides: AROM;Left;10 reps;Supine Hip ABduction/ADduction: AROM;Left;10 reps;Supine Knee Flexion: AROM;Left;5 reps;Seated    General Comments        Pertinent Vitals/Pain Pain Assessment: 0-10 Pain Score: 9  Pain Location: L knee Pain Descriptors / Indicators: Grimacing;Discomfort;Moaning Pain Intervention(s): Limited activity within patient's tolerance;Monitored during session;Repositioned    Home Living Family/patient expects to be discharged to:: Other (Comment) (rehab) Living Arrangements: Alone                  Prior Function            PT Goals (current goals can now be found in the care plan section) Acute Rehab PT Goals Patient Stated Goal: to get back to bed PT Goal Formulation: With patient Time For Goal Achievement: 12/18/14 Potential to Achieve Goals: Good Progress towards PT goals: Progressing toward goals    Frequency  7X/week    PT Plan Current plan remains appropriate    Co-evaluation             End of Session Equipment Utilized During Treatment: Gait belt Activity Tolerance: Patient limited by fatigue;Patient limited  by pain Patient left: in bed;in CPM;with call bell/phone within reach     Time: 1510-1528 PT Time Calculation (min) (ACUTE ONLY): 18 min  Charges:  $Therapeutic Exercise: 8-22 mins                    G CodesMichail Jewels PT, [place] [phone] 12/13/2014, 4:39 PM

## 2014-12-13 NOTE — Plan of Care (Signed)
Problem: Consults Goal: Diagnosis- Total Joint Replacement Primary Total Knee left     

## 2014-12-14 DIAGNOSIS — N189 Chronic kidney disease, unspecified: Secondary | ICD-10-CM | POA: Diagnosis not present

## 2014-12-14 DIAGNOSIS — R278 Other lack of coordination: Secondary | ICD-10-CM | POA: Diagnosis not present

## 2014-12-14 DIAGNOSIS — R609 Edema, unspecified: Secondary | ICD-10-CM | POA: Diagnosis not present

## 2014-12-14 DIAGNOSIS — N179 Acute kidney failure, unspecified: Secondary | ICD-10-CM | POA: Diagnosis not present

## 2014-12-14 DIAGNOSIS — D62 Acute posthemorrhagic anemia: Secondary | ICD-10-CM | POA: Diagnosis not present

## 2014-12-14 DIAGNOSIS — M25662 Stiffness of left knee, not elsewhere classified: Secondary | ICD-10-CM | POA: Diagnosis not present

## 2014-12-14 DIAGNOSIS — E1140 Type 2 diabetes mellitus with diabetic neuropathy, unspecified: Secondary | ICD-10-CM | POA: Diagnosis not present

## 2014-12-14 DIAGNOSIS — I10 Essential (primary) hypertension: Secondary | ICD-10-CM | POA: Diagnosis not present

## 2014-12-14 DIAGNOSIS — R2681 Unsteadiness on feet: Secondary | ICD-10-CM | POA: Diagnosis not present

## 2014-12-14 DIAGNOSIS — R7611 Nonspecific reaction to tuberculin skin test without active tuberculosis: Secondary | ICD-10-CM | POA: Diagnosis not present

## 2014-12-14 DIAGNOSIS — K5900 Constipation, unspecified: Secondary | ICD-10-CM | POA: Diagnosis not present

## 2014-12-14 DIAGNOSIS — E119 Type 2 diabetes mellitus without complications: Secondary | ICD-10-CM | POA: Diagnosis not present

## 2014-12-14 DIAGNOSIS — I82402 Acute embolism and thrombosis of unspecified deep veins of left lower extremity: Secondary | ICD-10-CM | POA: Diagnosis not present

## 2014-12-14 DIAGNOSIS — M7989 Other specified soft tissue disorders: Secondary | ICD-10-CM | POA: Diagnosis not present

## 2014-12-14 DIAGNOSIS — F329 Major depressive disorder, single episode, unspecified: Secondary | ICD-10-CM | POA: Diagnosis not present

## 2014-12-14 DIAGNOSIS — Z794 Long term (current) use of insulin: Secondary | ICD-10-CM | POA: Diagnosis not present

## 2014-12-14 DIAGNOSIS — M1990 Unspecified osteoarthritis, unspecified site: Secondary | ICD-10-CM | POA: Diagnosis not present

## 2014-12-14 DIAGNOSIS — G629 Polyneuropathy, unspecified: Secondary | ICD-10-CM | POA: Diagnosis not present

## 2014-12-14 DIAGNOSIS — S83105A Unspecified dislocation of left knee, initial encounter: Secondary | ICD-10-CM | POA: Diagnosis not present

## 2014-12-14 DIAGNOSIS — I129 Hypertensive chronic kidney disease with stage 1 through stage 4 chronic kidney disease, or unspecified chronic kidney disease: Secondary | ICD-10-CM | POA: Diagnosis not present

## 2014-12-14 DIAGNOSIS — M25562 Pain in left knee: Secondary | ICD-10-CM | POA: Diagnosis not present

## 2014-12-14 DIAGNOSIS — M1712 Unilateral primary osteoarthritis, left knee: Secondary | ICD-10-CM | POA: Diagnosis not present

## 2014-12-14 DIAGNOSIS — M6281 Muscle weakness (generalized): Secondary | ICD-10-CM | POA: Diagnosis not present

## 2014-12-14 DIAGNOSIS — Z471 Aftercare following joint replacement surgery: Secondary | ICD-10-CM | POA: Diagnosis not present

## 2014-12-14 DIAGNOSIS — E785 Hyperlipidemia, unspecified: Secondary | ICD-10-CM | POA: Diagnosis not present

## 2014-12-14 DIAGNOSIS — Z96652 Presence of left artificial knee joint: Secondary | ICD-10-CM | POA: Diagnosis not present

## 2014-12-14 DIAGNOSIS — Z79899 Other long term (current) drug therapy: Secondary | ICD-10-CM | POA: Diagnosis not present

## 2014-12-14 DIAGNOSIS — R531 Weakness: Secondary | ICD-10-CM | POA: Diagnosis not present

## 2014-12-14 DIAGNOSIS — M792 Neuralgia and neuritis, unspecified: Secondary | ICD-10-CM | POA: Diagnosis not present

## 2014-12-14 LAB — CBC
HCT: 30.1 % — ABNORMAL LOW (ref 39.0–52.0)
Hemoglobin: 9.9 g/dL — ABNORMAL LOW (ref 13.0–17.0)
MCH: 27 pg (ref 26.0–34.0)
MCHC: 32.9 g/dL (ref 30.0–36.0)
MCV: 82 fL (ref 78.0–100.0)
Platelets: 244 10*3/uL (ref 150–400)
RBC: 3.67 MIL/uL — AB (ref 4.22–5.81)
RDW: 13.8 % (ref 11.5–15.5)
WBC: 8 10*3/uL (ref 4.0–10.5)

## 2014-12-14 LAB — GLUCOSE, CAPILLARY
Glucose-Capillary: 105 mg/dL — ABNORMAL HIGH (ref 70–99)
Glucose-Capillary: 142 mg/dL — ABNORMAL HIGH (ref 70–99)

## 2014-12-14 NOTE — Discharge Planning (Signed)
Patient will discharge today per MD order. [facility] to call report prior to transportation to [phone] Transportation: [facility]- (SNF room ready approx 1:30pm)  CSW sent discharge summary to SNF for review.  Packet is complete.  RN and patient aware of discharge plans.  Admission's paperwork was complete at bedside.    Vickii PennaGina Nichols Corter, LCSWA [phone]  Psychiatric & Orthopedics (5N 1-16) Clinical Social Worker

## 2014-12-14 NOTE — Progress Notes (Signed)
Physical Therapy Treatment Patient Details Name: Angel Phillips MRN: [id number] DOB: 1961-01-16 Today's Date: 12/14/2014    History of Present Illness Patient is a 54 yo male admitted 12/11/14 now s/p Lt TKA.  PMH:  ETOH abuse, depression, OSA, HTN, DM, arthritis    PT Comments    Pt tolerated session well today and improved ambulatory distance; however gait velocity decreased (.17 ft/sec).  Pt is anticipating d/c to SNF today.    Follow Up Recommendations  SNF;Supervision/Assistance - 24 hour     Equipment Recommendations  Rolling walker with 5" wheels;3in1 (PT)    Recommendations for Other Services       Precautions / Restrictions Precautions Precautions: Knee;Fall Precaution Comments: Reviewed precautions with patient. Restrictions Weight Bearing Restrictions: Yes LLE Weight Bearing: Weight bearing as tolerated    Mobility  Bed Mobility Overal bed mobility: Modified Independent Bed Mobility: Supine to Sit     Supine to sit: Modified independent (Device/Increase time)     General bed mobility comments: increased time  Transfers Overall transfer level: Needs assistance Equipment used: Rolling walker (2 wheeled) Transfers: Sit to/from Stand Sit to Stand: Supervision         General transfer comment: cues to push from bed  Ambulation/Gait Ambulation/Gait assistance: Min guard Ambulation Distance (Feet): 70 Feet Assistive device: Rolling walker (2 wheeled) Gait Pattern/deviations: Step-to pattern;Decreased stance time - left;Decreased stride length;Antalgic Gait velocity: .17 ft/sec Gait velocity interpretation: <1.8 ft/sec, indicative of risk for recurrent falls General Gait Details: verbal cues to stand upright   Stairs            Wheelchair Mobility    Modified Rankin (Stroke Patients Only)       Balance Overall balance assessment: Needs assistance Sitting-balance support: No upper extremity supported;Feet supported Sitting balance-Leahy  Scale: Fair     Standing balance support: Bilateral upper extremity supported Standing balance-Leahy Scale: Fair                      Cognition Arousal/Alertness: Awake/alert Behavior During Therapy: WFL for tasks assessed/performed Overall Cognitive Status: Within Functional Limits for tasks assessed                      Exercises      General Comments        Pertinent Vitals/Pain Pain Assessment: 0-10 Pain Score: 8  Pain Location: L knee Pain Descriptors / Indicators: Discomfort;Grimacing;Guarding Pain Intervention(s): Limited activity within patient's tolerance;Monitored during session;Repositioned    Home Living                      Prior Function            PT Goals (current goals can now be found in the care plan section) Acute Rehab PT Goals Patient Stated Goal: to put CPM on after session PT Goal Formulation: With patient Time For Goal Achievement: 12/18/14 Potential to Achieve Goals: Good Progress towards PT goals: Progressing toward goals    Frequency  7X/week    PT Plan Current plan remains appropriate    Co-evaluation             End of Session Equipment Utilized During Treatment: Gait belt Activity Tolerance: Patient limited by fatigue;Patient limited by pain Patient left: in bed;in CPM;with call bell/phone within reach     Time: 1138-1207 PT Time Calculation (min) (ACUTE ONLY): 29 min  Charges:  $Gait Training: 23-37 mins  G CodesMichail Jewels PT, [place] [phone] 12/14/2014, 12:22 PM

## 2014-12-14 NOTE — Progress Notes (Signed)
Patient ID: Angel Phillips, male   DOB: November 19, 1960, 54 y.o.   MRN: [id number] PATIENT ID: Angel Starrhomas Betke  MRN: [id number]  DOB/AGE:  November 19, 1960 / 54 y.o.  3 Days Post-Op Procedure(s) (LRB): TOTAL KNEE ARTHROPLASTY (Left)    PROGRESS NOTE Subjective: Patient is alert, oriented, no Nausea, no Vomiting, yes passing gas, no Bowel Movement. Taking PO well. Denies SOB, Chest or Calf Pain. Using Incentive Spirometer, PAS in place. Ambulate 20 ft, CPM 0-60 Patient reports pain as 3 on 0-10 scale  .    Objective: Vital signs in last 24 hours: Filed Vitals:   12/13/14 2355 12/14/14 0015 12/14/14 0400 12/14/14 0542  BP:  152/62  156/76  Pulse:  95  95  Temp:  97.5 F (36.4 C)  99.8 F (37.7 C)  TempSrc:  Oral  Oral  Resp: 18  18   Weight:      SpO2: 98% 100% 96% 96%      Intake/Output from previous day: I/O last 3 completed shifts: In: 1200 [P.O.:1200] Out: 2070 [Urine:2070]   Intake/Output this shift:     LABORATORY DATA:  Recent Labs  12/12/14 0515  12/13/14 0756  12/13/14 1707 12/13/14 2130 12/14/14 0638  WBC 7.1  --  9.3  --   --   --   --   HGB 10.0*  --  10.2*  --   --   --   --   HCT 30.9*  --  31.2*  --   --   --   --   PLT 268  --  264  --   --   --   --   GLUCAP  --   < >  --   < > 180* 139* 105*  < > = values in this interval not displayed.  Examination: Neurologically intact ABD soft Neurovascular intact Sensation intact distally Intact pulses distally Dorsiflexion/Plantar flexion intact Incision: dressing C/D/I and no drainage No cellulitis present Compartment soft} Dressing change today wound was clean and dry  Assessment:   3 Days Post-Op Procedure(s) (LRB): TOTAL KNEE ARTHROPLASTY (Left) ADDITIONAL DIAGNOSIS: Expected Acute Blood Loss Anemia, history of alcohol abuse  Plan: PT/OT WBAT, CPM 5/hrs day until ROM 0-90 degrees, then D/C CPM DVT Prophylaxis:  SCDx72hrs, ASA 325 mg BID x 2 weeks DISCHARGE PLAN: Skilled Nursing Facility/Rehab, 5 Days  [address] then outpatient PT after discharge DISCHARGE NEEDS: HHPT, HHRN, CPM, Walker and 3-in-1 comode seat     Shanetta Nicolls J 12/14/2014, 7:48 AM

## 2014-12-14 NOTE — Discharge Instructions (Signed)
TOTAL KNEE REPLACEMENT POSTOPERATIVE DIRECTIONS ° ° ° °Knee Rehabilitation, Guidelines Following Surgery  °Results after knee surgery are often greatly improved when you follow the exercise, range of motion and muscle strengthening exercises prescribed by your doctor. Safety measures are also important to protect the knee from further injury. Any time any of these exercises cause you to have increased pain or swelling in your knee joint, decrease the amount until you are comfortable again and slowly increase them. If you have problems or questions, call your caregiver or physical therapist for advice.  ° °HOME CARE INSTRUCTIONS  °Remove items at home which could result in a fall. This includes throw rugs or furniture in walking pathways.  °Continue medications as instructed at time of discharge. °You may have some home medications which will be placed on hold until you complete the course of blood thinner medication.  °You may start showering once you are discharged home but do not submerge the incision under water. Just pat the incision dry and apply a dry gauze dressing on daily. °Walk with walker as instructed.  °You may resume a sexual relationship in one month or when given the OK by  your doctor.  °· Use walker as long as suggested by your caregivers. °· Avoid periods of inactivity such as sitting longer than an hour when not asleep. This helps prevent blood clots.  °You may put full weight on your legs and walk as much as is comfortable.  °You may return to work once you are cleared by your doctor.  °Do not drive a car for 6 weeks or until released by you surgeon.  °· Do not drive while taking narcotics.  °Wear the elastic stockings for three weeks following surgery during the day but you may remove then at night. °Make sure you keep all of your appointments after your operation with all of your doctors and caregivers. You should call the office at the above phone number and make an appointment for  approximately two weeks after the date of your surgery. °Change the dressing daily and reapply a dry dressing each time. °Please pick up a stool softener and laxative for home use as long as you are requiring pain medications. °· Continue to use ice on the knee for pain and swelling from surgery. You may notice swelling that will progress down to the foot and ankle.  This is normal after surgery.  Elevate the leg when you are not up walking on it.   °It is important for you to complete the blood thinner medication as prescribed by your doctor. °· Continue to use the breathing machine which will help keep your temperature down.  It is common for your temperature to cycle up and down following surgery, especially at night when you are not up moving around and exerting yourself.  The breathing machine keeps your lungs expanded and your temperature down. ° °RANGE OF MOTION AND STRENGTHENING EXERCISES  °Rehabilitation of the knee is important following a knee injury or an operation. After just a few days of immobilization, the muscles of the thigh which control the knee become weakened and shrink (atrophy). Knee exercises are designed to build up the tone and strength of the thigh muscles and to improve knee motion. Often times heat used for twenty to thirty minutes before working out will loosen up your tissues and help with improving the range of motion but do not use heat for the first two weeks following surgery. These exercises can be done   on a training (exercise) mat, on the floor, on a table or on a bed. Use what ever works the best and is most comfortable for you Knee exercises include:  °Leg Lifts - While your knee is still immobilized in a splint or cast, you can do straight leg raises. Lift the leg to 60 degrees, hold for 3 sec, and slowly lower the leg. Repeat 10-20 times 2-3 times daily. Perform this exercise against resistance later as your knee gets better.  °Quad and Hamstring Sets - Tighten up the muscle  on the front of the thigh (Quad) and hold for 5-10 sec. Repeat this 10-20 times hourly. Hamstring sets are done by pushing the foot backward against an object and holding for 5-10 sec. Repeat as with quad sets.  °A rehabilitation program following serious knee injuries can speed recovery and prevent re-injury in the future due to weakened muscles. Contact your doctor or a physical therapist for more information on knee rehabilitation.  ° °SKILLED REHAB INSTRUCTIONS: °If the patient is transferred to a skilled rehab facility following release from the hospital, a list of the current medications will be sent to the facility for the patient to continue.  When discharged from the skilled rehab facility, please have the facility set up the patient's Home Health Physical Therapy prior to being released. Also, the skilled facility will be responsible for providing the patient with their medications at time of release from the facility to include their pain medication, the muscle relaxants, and their blood thinner medication. If the patient is still at the rehab facility at time of the two week follow up appointment, the skilled rehab facility will also need to assist the patient in arranging follow up appointment in our office and any transportation needs. ° °MAKE SURE YOU:  °Understand these instructions.  °Will watch your condition.  °Will get help right away if you are not doing well or get worse.  ° ° °Pick up stool softner and laxative for home. °Do not submerge incision under water. °May shower. °Continue to use ice for pain and swelling from surgery. ° °

## 2014-12-14 NOTE — Progress Notes (Signed)
CARE MANAGEMENT NOTE 12/14/2014  Patient:  Angel Phillips,Angel Phillips   Account Number:  [id number]  Date Initiated:  12/12/2014  Documentation initiated by:  Jacquelynn CreeKRIEG,Jenniah Bhavsar  Subjective/Objective Assessment:   s/p left TKA     Action/Plan:   PT/OT evals-recommended SNF   Anticipated DC Date:  12/13/2014   Anticipated DC Plan:  SKILLED NURSING FACILITY  In-house referral  Clinical Social Worker      DC Planning Services  CM consult               Status of service:  In process, will continue to follow Medicare Important Message given?  YES Date Medicare IM given:  12/14/2014 Medicare IM given by:  [employer]RI[employer],Tip Atienza   Discharge Disposition:  SKILLED NURSING FACILITY  Per UR Regulation:  Reviewed for med. necessity/level of care/duration of stay   Comments:  [facility]12[facility]14 [facility] office. PT recommended SNF, referral made to CSW. CSW working on SNF placement.

## 2014-12-14 NOTE — Progress Notes (Signed)
Report called to [facility]. 

## 2014-12-14 NOTE — Clinical Social Work Placement (Signed)
Clinical Social Work Department CLINICAL SOCIAL WORK PLACEMENT NOTE 12/12/2014  Patient:  Angel Phillips,[name]  Account Number:  [id number] Admit date:  12/11/2014  Clinical Social Worker:  Hortencia PilarKIERRA WILEY, CLINICAL SOCIAL WORKER  Date/time:  12/12/2014 12:17 PM  Clinical Social Work is seeking post-discharge placement for this patient at the following level of care:   SKILLED NURSING   (*CSW will update this form in Epic as items are completed)   12/12/2014  [facility]/family provided with [facility] of Clinical Social Work's list of facilities offering this level of care within the geographic area requested by the patient (or if unable, by the patient's family).  12/12/2014  Patient/family informed of their freedom to choose among providers that offer the needed level of care, that participate in Medicare, Medicaid or managed care program needed by the patient, have an available bed and are willing to accept the patient.  12/12/2014  Patient/family informed of [facility]' ownership interest in [facility], as well as of the fact that they are under no obligation to receive care at this facility.  PASARR submitted to EDS on 12/12/2014 PASARR number received on 12/12/2014  FL2 transmitted to all facilities in geographic area requested by pt/family on   FL2 transmitted to all facilities within larger geographic area on   Patient informed that his/her managed care company has contracts with or will negotiate with  certain facilities, including the following:     Patient/family informed of bed offers received:  12/13/2014 Vickii Penna(Gina Tongela Encinas, Theresia MajorsLCSWA)  [facility] Vickii Penna(Gina Geet [place], [place])  Physician recommends and patient [facility] Vickii Penna(Gina Tinamarie Przybylski, [place])   [facility]  [facility]/2014 Patient to be transferred to facility by [facility] Patient and family notified of transfer on 12/14/2014 Name of family  member notified:  Patient is alert and oriented and will notify family as necessary  The following physician request were entered in Epic:   Additional Comments:  Tivis RingerKierra S. Wily, BSW-Intern

## 2014-12-14 NOTE — Discharge Summary (Signed)
Patient ID: Angel Phillips MRN: [id number] DOB/AGE: 09/30/1960 54 y.o.  Admit date: 12/11/2014 Discharge date: 12/14/2014  Admission Diagnoses:  Active Problems:   Osteoarthritis of left knee   Discharge Diagnoses:  Same  Past Medical History  Diagnosis Date  . Hypertension   . Diabetes mellitus without complication   . Arthritis   . Tuberculosis     tb positive 1989 ?   Marland Kitchen Depression   . PONV (postoperative nausea and vomiting)   . Chronic kidney disease 02/2013    ACUTE RENAL FAILURE    Surgeries: Procedure(s): TOTAL KNEE ARTHROPLASTY on 12/11/2014   Consultants:    Discharged Condition: Improved  Hospital Course: Lux Meaders is an 54 y.o. male who was admitted 12/11/2014 for operative treatment of<principal problem not specified>. Patient has severe unremitting pain that affects sleep, daily activities, and work/hobbies. After pre-op clearance the patient was taken to the operating room on 12/11/2014 and underwent  Procedure(s): TOTAL KNEE ARTHROPLASTY.    Patient was given perioperative antibiotics: Anti-infectives    Start     Dose/Rate Route Frequency Ordered Stop   12/11/14 1112  cefUROXime (ZINACEF) injection  Status:  Discontinued       As needed 12/11/14 1112 12/11/14 1231   12/11/14 0600  ceFAZolin (ANCEF) IVPB 2 g/50 mL premix     2 g 100 mL/hr over 30 Minutes Intravenous On call to O.R. 12/10/14 1225 12/11/14 1035       Patient was given sequential compression devices, early ambulation, and chemoprophylaxis to prevent DVT.  Patient benefited maximally from hospital stay and there were no complications.    Recent vital signs: Patient Vitals for the past 24 hrs:  BP Temp Temp src Pulse Resp SpO2  12/14/14 0542 (!) 156/76 mmHg 99.8 F (37.7 C) Oral 95 - 96 %  12/14/14 0400 - - - - 18 96 %  12/14/14 0015 (!) 152/62 mmHg 97.5 F (36.4 C) Oral 95 - 100 %  12/13/14 2355 - - - - 18 98 %  12/13/14 2029 (!) 159/63 mmHg 99.3 F (37.4 C) Oral 94 - 98 %   12/13/14 2000 - - - - 18 98 %  12/13/14 1200 - - - - 18 98 %  12/13/14 0800 - - - - 18 98 %     Recent laboratory studies:  Recent Labs  12/12/14 0515 12/13/14 0756  WBC 7.1 9.3  HGB 10.0* 10.2*  HCT 30.9* 31.2*  PLT 268 264     Discharge Medications:     Medication List    STOP taking these medications        amoxicillin 500 MG tablet  Commonly known as:  AMOXIL      TAKE these medications        acetaminophen 500 MG tablet  Commonly known as:  TYLENOL  Take 2,000 mg by mouth every 2 (two) hours as needed for mild pain or headache.     amLODipine 10 MG tablet  Commonly known as:  NORVASC  Take 1 tablet (10 mg total) by mouth daily. For high blood pressure control     aspirin EC 325 MG tablet  Take 1 tablet (325 mg total) by mouth 2 (two) times daily.     chlorthalidone 25 MG tablet  Commonly known as:  HYGROTON  Take 1 tablet (25 mg total) by mouth daily. For control of HTN/diabetes mellitus     cyclobenzaprine 5 MG tablet  Commonly known as:  FLEXERIL  Take 1 tablet (5 mg  total) by mouth 3 (three) times daily as needed for muscle spasms. For muscle spasm     gabapentin 300 MG capsule  Commonly known as:  NEURONTIN  Take 2 capsules (600 mg total) by mouth 2 (two) times daily. For pain/anxiety     glipiZIDE 10 MG tablet  Commonly known as:  GLUCOTROL  Take 2.5 mg by mouth 2 (two) times daily before a meal.     HYDROmorphone 2 MG tablet  Commonly known as:  DILAUDID  Take 1 tablet (2 mg total) by mouth every 6 (six) hours as needed for severe pain.     insulin glargine 100 UNIT/ML injection  Commonly known as:  LANTUS  Inject 10 Units into the skin daily.     lidocaine 5 %  Commonly known as:  LIDODERM  Place 1 patch onto the skin daily. Remove & Discard patch within 12 hours or as directed by MD: For pain management     lisinopril 20 MG tablet  Commonly known as:  PRINIVIL,ZESTRIL  Take 0.5 tablets (10 mg total) by mouth daily. For high blood  pressure control     metFORMIN 500 MG tablet  Commonly known as:  GLUCOPHAGE  Take 750 mg by mouth 2 (two) times daily with a meal.     sertraline 100 MG tablet  Commonly known as:  ZOLOFT  Take 100 mg by mouth daily.     simvastatin 40 MG tablet  Commonly known as:  ZOCOR  Take 20 mg by mouth at bedtime.     VITAMIN B-12 IJ  Inject as directed every 30 (thirty) days.        Diagnostic Studies: Dg Chest 2 View  12/06/2014   CLINICAL DATA:  Preoperative evaluation for total knee arthroplasty, history hypertension, diabetes, TB  EXAM: CHEST  2 VIEW  COMPARISON:  03/14/2013  FINDINGS: Upper normal heart size.  Normal mediastinal contours and pulmonary vascularity.  RIGHT basilar atelectasis and central peribronchial thickening.  No acute infiltrate, pleural effusion or pneumothorax.  Lung apices clear.  Prior thoracolumbar spinal fixation.  IMPRESSION: Bronchitic changes with RIGHT basilar atelectasis.   Electronically Signed   By: Ulyses SouthwardMark  Boles M.D.   On: 12/06/2014 14:44    Disposition: 01-Home or Self Care      Discharge Instructions    CPM    Complete by:  As directed   Continuous passive motion machine (CPM):      Use the CPM from 0 to 60 for 5 hours per day.      You may increase by 10 per day.  You may break it up into 2 or 3 sessions per day.      Use CPM for 1 weeks or until you are told to stop.     Call MD / Call 911    Complete by:  As directed   If you experience chest pain or shortness of breath, CALL 911 and be transported to the hospital emergency room.  If you develope a fever above 101 F, pus (white drainage) or increased drainage or redness at the wound, or calf pain, call your surgeon's office.     Change dressing    Complete by:  As directed   Change dressing on a when necessary, then change the dressing daily with sterile 4 x 4 inch gauze dressing and apply TED hose.  You may clean the incision with alcohol prior to redressing.     Constipation Prevention     Complete by:  As directed   Drink plenty of fluids.  Prune juice may be helpful.  You may use a stool softener, such as Colace (over the counter) 100 mg twice a day.  Use MiraLax (over the counter) for constipation as needed.     Diet - low sodium heart healthy    Complete by:  As directed      Do not put a pillow under the knee. Place it under the heel.    Complete by:  As directed      Driving restrictions    Complete by:  As directed   No driving for 2 weeks     Increase activity slowly as tolerated    Complete by:  As directed      Lifting restrictions    Complete by:  As directed   No lifting for 2 weeks           Follow-up Information    Follow up with Nestor Lewandowsky, MD In 2 weeks.   Specialty:  Orthopedic Surgery   Contact information:   [address] [place] [address] [phone]       Follow up with Nestor Lewandowsky, MD In 1 week.   Specialty:  Orthopedic Surgery   Contact information:   [address] [place] [address] [phone]        Signed: Nestor Lewandowsky 12/14/2014, 7:53 AM

## 2014-12-15 ENCOUNTER — Non-Acute Institutional Stay (SKILLED_NURSING_FACILITY): Payer: Medicare Other | Admitting: Adult Health

## 2014-12-15 ENCOUNTER — Emergency Department (HOSPITAL_COMMUNITY)
Admission: EM | Admit: 2014-12-15 | Discharge: 2014-12-15 | Disposition: A | Discharge status: Home / Self Care | Payer: Medicare Other | Attending: Emergency Medicine | Admitting: Emergency Medicine

## 2014-12-15 ENCOUNTER — Encounter (HOSPITAL_COMMUNITY): Payer: Self-pay | Admitting: Emergency Medicine

## 2014-12-15 ENCOUNTER — Encounter: Payer: Self-pay | Admitting: Adult Health

## 2014-12-15 DIAGNOSIS — I10 Essential (primary) hypertension: Secondary | ICD-10-CM

## 2014-12-15 DIAGNOSIS — I82402 Acute embolism and thrombosis of unspecified deep veins of left lower extremity: Secondary | ICD-10-CM

## 2014-12-15 DIAGNOSIS — E785 Hyperlipidemia, unspecified: Secondary | ICD-10-CM | POA: Diagnosis not present

## 2014-12-15 DIAGNOSIS — E1140 Type 2 diabetes mellitus with diabetic neuropathy, unspecified: Secondary | ICD-10-CM

## 2014-12-15 DIAGNOSIS — Z79899 Other long term (current) drug therapy: Secondary | ICD-10-CM | POA: Insufficient documentation

## 2014-12-15 DIAGNOSIS — I129 Hypertensive chronic kidney disease with stage 1 through stage 4 chronic kidney disease, or unspecified chronic kidney disease: Secondary | ICD-10-CM | POA: Insufficient documentation

## 2014-12-15 DIAGNOSIS — N189 Chronic kidney disease, unspecified: Secondary | ICD-10-CM | POA: Diagnosis not present

## 2014-12-15 DIAGNOSIS — F329 Major depressive disorder, single episode, unspecified: Secondary | ICD-10-CM | POA: Insufficient documentation

## 2014-12-15 DIAGNOSIS — M1712 Unilateral primary osteoarthritis, left knee: Secondary | ICD-10-CM | POA: Diagnosis not present

## 2014-12-15 DIAGNOSIS — M1990 Unspecified osteoarthritis, unspecified site: Secondary | ICD-10-CM | POA: Insufficient documentation

## 2014-12-15 DIAGNOSIS — M7989 Other specified soft tissue disorders: Secondary | ICD-10-CM | POA: Diagnosis not present

## 2014-12-15 DIAGNOSIS — G629 Polyneuropathy, unspecified: Secondary | ICD-10-CM | POA: Diagnosis not present

## 2014-12-15 DIAGNOSIS — F32A Depression, unspecified: Secondary | ICD-10-CM

## 2014-12-15 DIAGNOSIS — E119 Type 2 diabetes mellitus without complications: Secondary | ICD-10-CM | POA: Diagnosis not present

## 2014-12-15 DIAGNOSIS — Z794 Long term (current) use of insulin: Secondary | ICD-10-CM | POA: Insufficient documentation

## 2014-12-15 LAB — CBC WITH DIFFERENTIAL/PLATELET
Basophils Absolute: 0 10*3/uL (ref 0.0–0.1)
Basophils Relative: 0 % (ref 0–1)
Eosinophils Absolute: 0.1 10*3/uL (ref 0.0–0.7)
Eosinophils Relative: 2 % (ref 0–5)
HEMATOCRIT: 26.3 % — AB (ref 39.0–52.0)
HEMOGLOBIN: 8.5 g/dL — AB (ref 13.0–17.0)
LYMPHS PCT: 16 % (ref 12–46)
Lymphs Abs: 1 10*3/uL (ref 0.7–4.0)
MCH: 26.4 pg (ref 26.0–34.0)
MCHC: 32.3 g/dL (ref 30.0–36.0)
MCV: 81.7 fL (ref 78.0–100.0)
MONO ABS: 0.5 10*3/uL (ref 0.1–1.0)
Monocytes Relative: 8 % (ref 3–12)
NEUTROS PCT: 74 % (ref 43–77)
Neutro Abs: 4.6 10*3/uL (ref 1.7–7.7)
Platelets: 315 10*3/uL (ref 150–400)
RBC: 3.22 MIL/uL — AB (ref 4.22–5.81)
RDW: 13.7 % (ref 11.5–15.5)
WBC: 6.1 10*3/uL (ref 4.0–10.5)

## 2014-12-15 LAB — BASIC METABOLIC PANEL
ANION GAP: 8 (ref 5–15)
BUN: 41 mg/dL — ABNORMAL HIGH (ref 6–23)
CHLORIDE: 103 mmol/L (ref 96–112)
CO2: 27 mmol/L (ref 19–32)
CREATININE: 1.68 mg/dL — AB (ref 0.50–1.35)
Calcium: 8.4 mg/dL (ref 8.4–10.5)
GFR calc Af Amer: 51 mL/min — ABNORMAL LOW (ref >90)
GFR calc non Af Amer: 44 mL/min — ABNORMAL LOW (ref >90)
GLUCOSE: 87 mg/dL (ref 70–99)
Potassium: 3.3 mmol/L — ABNORMAL LOW (ref 3.5–5.1)
Sodium: 138 mmol/L (ref 135–145)

## 2014-12-15 MED ORDER — RIVAROXABAN 15 MG PO TABS
15.0000 mg | ORAL_TABLET | Freq: Two times a day (BID) | ORAL | Status: DC
  Administered 2014-12-15: 15 mg via ORAL
  Filled 2014-12-15 (×3): qty 1

## 2014-12-15 MED ORDER — HYDROMORPHONE HCL 1 MG/ML IJ SOLN
1.0000 mg | Freq: Once | INTRAMUSCULAR | Status: AC
  Administered 2014-12-15: 1 mg via INTRAVENOUS
  Filled 2014-12-15: qty 1

## 2014-12-15 MED ORDER — XARELTO VTE STARTER PACK 15 & 20 MG PO TBPK: 15.0000 mg | ORAL_TABLET | ORAL | Status: DC

## 2014-12-15 MED ORDER — ONDANSETRON HCL 4 MG/2ML IJ SOLN
4.0000 mg | Freq: Once | INTRAMUSCULAR | Status: AC
  Administered 2014-12-15: 4 mg via INTRAVENOUS
  Filled 2014-12-15: qty 2

## 2014-12-15 MED ORDER — RIVAROXABAN (XARELTO) EDUCATION KIT FOR DVT/PE PATIENTS
PACK | Freq: Once | Status: AC
Start: 2014-12-15 — End: 2014-12-15
  Administered 2014-12-15: 10:00:00
  Filled 2014-12-15: qty 1

## 2014-12-15 MED ORDER — SODIUM CHLORIDE 0.9 % IV BOLUS (SEPSIS)
1000.0000 mL | Freq: Once | INTRAVENOUS | Status: AC
  Administered 2014-12-15: 1000 mL via INTRAVENOUS

## 2014-12-15 MED ORDER — RIVAROXABAN 20 MG PO TABS: 20.0000 mg | ORAL_TABLET | Freq: Every day | ORAL | Status: DC

## 2014-12-15 NOTE — ED Provider Notes (Signed)
CSN: [id number]     Arrival date & time 12/15/14  0703 History   First MD Initiated Contact with Patient 12/15/14 [id number]     Chief Complaint  Patient presents with  . Knee Pain     (Consider location/radiation/quality/duration/timing/severity/associated sxs/prior Treatment) HPI Comments: Patient is 54 yo M past medical history significant for hypertension, DM, depression presenting to the emergency department for evaluation of left calf swelling with increased knee pain postop day 4 after total knee replacement performed by Dr. Hal Morales. Patient states he had been doing well, awoke this morning with increased pain, redness and warmth and swelling to his calf. No relief from his at-home pain medication. No modifying factors identified. Denies any fevers or chills, purulent drainage from incision site. He does endorse a history of DVT 20 years ago. He is not taking any anticoagulants currently.  Patient is a 54 y.o. male presenting with knee pain.  Knee Pain   Past Medical History  Diagnosis Date  . Hypertension   . Diabetes mellitus without complication   . Arthritis   . Tuberculosis     tb positive 1989 ?   Marland Kitchen Depression   . PONV (postoperative nausea and vomiting)   . Chronic kidney disease 02/2013    ACUTE RENAL FAILURE   Past Surgical History  Procedure Laterality Date  . Knee arthroscopy    . Gastric bypass    . Spinal fusion      Harrington Rod  . Total knee arthroplasty Left 12/11/2014    Procedure: TOTAL KNEE ARTHROPLASTY;  Surgeon: Frederik Pear, MD;  Location: [facility];  Service: Orthopedics;  Laterality: Left;   No family history on file. History  Substance Use Topics  . Smoking status: Never Smoker   . Smokeless tobacco: Never Used  . Alcohol Use: No     Comment: no longer drinks alcohol"    Review of Systems  Musculoskeletal: Positive for myalgias, joint swelling and arthralgias.  All other systems reviewed and are negative.     Allergies  Oxycodone  Home  Medications   Prior to Admission medications   Medication Sig Start Date End Date Taking? Authorizing Provider  acetaminophen (TYLENOL) 500 MG tablet Take 2,000 mg by mouth every 2 (two) hours as needed for mild pain or headache.    Historical Provider, MD  amLODipine (NORVASC) 10 MG tablet Take 1 tablet (10 mg total) by mouth daily. For high blood pressure control 06/15/13   Encarnacion Slates, NP  chlorthalidone (HYGROTON) 25 MG tablet Take 1 tablet (25 mg total) by mouth daily. For control of HTN/diabetes mellitus Patient taking differently: Take 25 mg by mouth daily with breakfast. For control of HTN/diabetes mellitus 06/15/13   Encarnacion Slates, NP  Cyanocobalamin (VITAMIN B-12 IJ) Inject as directed every 30 (thirty) days.    Historical Provider, MD  cyclobenzaprine (FLEXERIL) 5 MG tablet Take 1 tablet (5 mg total) by mouth 3 (three) times daily as needed for muscle spasms. For muscle spasm 12/11/14   Leighton Parody, PA-C  gabapentin (NEURONTIN) 300 MG capsule Take 2 capsules (600 mg total) by mouth 2 (two) times daily. For pain/anxiety 06/15/13   Encarnacion Slates, NP  glipiZIDE (GLUCOTROL) 10 MG tablet Take 2.5 mg by mouth 2 (two) times daily before a meal.     Historical Provider, MD  HYDROmorphone (DILAUDID) 2 MG tablet Take 1 tablet (2 mg total) by mouth every 6 (six) hours as needed for severe pain. 12/11/14   Leighton Parody,  PA-C  insulin glargine (LANTUS) 100 UNIT/ML injection Inject 10 Units into the skin daily.    Historical Provider, MD  lidocaine (LIDODERM) 5 % Place 1 patch onto the skin daily. Remove & Discard patch within 12 hours or as directed by MD: For pain management 06/15/13   Encarnacion Slates, NP  lisinopril (PRINIVIL,ZESTRIL) 20 MG tablet Take 0.5 tablets (10 mg total) by mouth daily. For high blood pressure control Patient taking differently: Take 20 mg by mouth daily. For high blood pressure control 06/15/13   Encarnacion Slates, NP  metFORMIN (GLUCOPHAGE) 500 MG tablet Take 750 mg by mouth 2  (two) times daily with a meal.    Historical Provider, MD  sertraline (ZOLOFT) 100 MG tablet Take 100 mg by mouth daily.    Historical Provider, MD  simvastatin (ZOCOR) 40 MG tablet Take 20 mg by mouth at bedtime.    Historical Provider, MD  XARELTO STARTER PACK 15 & 20 MG TBPK Take 15-20 mg by mouth as directed. Take as directed on package: Start with one 24m tablet by mouth twice a day with food. On Day 22, switch to one 276mtablet once a day with food. 12/15/14   Firmin Belisle, PA-C   BP 133/69 mmHg  Pulse 83  Temp(Src) 98.7 F (37.1 C) (Oral)  Resp 19  Ht 5' 11"  (1.803 m)  Wt 256 lb (116.121 kg)  BMI 35.72 kg/m2  SpO2 95% Physical Exam  Constitutional: He is oriented to person, place, and time. He appears well-developed and well-nourished. No distress.  HENT:  Head: Normocephalic and atraumatic.  Right Ear: External ear normal.  Left Ear: External ear normal.  Nose: Nose normal.  Mouth/Throat: Oropharynx is clear and moist.  Eyes: Conjunctivae are normal.  Neck: Normal range of motion. Neck supple.  Cardiovascular: Normal rate, regular rhythm, normal heart sounds and intact distal pulses.   Pulmonary/Chest: Effort normal and breath sounds normal.  Abdominal: Soft. There is no tenderness.  Musculoskeletal:       Right knee: Normal.       Left knee: He exhibits decreased range of motion. He exhibits no effusion. Tenderness found.       Right ankle: Normal.       Left ankle: He exhibits swelling. He exhibits normal range of motion. Tenderness.       Right lower leg: Normal.       Left lower leg: He exhibits tenderness and swelling.       Legs:      Right foot: Normal.       Left foot: There is swelling. There is normal range of motion.  Calf erythematous. TTP. Positive Homan sign.   Neurological: He is alert and oriented to person, place, and time.  Skin: Skin is warm and dry. He is not diaphoretic.  Psychiatric: He has a normal mood and affect.  Nursing note and  vitals reviewed.   ED Course  Procedures (including critical care time) Medications  Rivaroxaban (XARELTO) tablet 15 mg (15 mg Oral Given 12/15/14 0951)  rivaroxaban (XARELTO) tablet 20 mg (not administered)  HYDROmorphone (DILAUDID) injection 1 mg (1 mg Intravenous Given 12/15/14 0742)  ondansetron (ZOFRAN) injection 4 mg (4 mg Intravenous Given 12/15/14 0741)  sodium chloride 0.9 % bolus 1,000 mL (0 mLs Intravenous Stopped 12/15/14 0930)  rivaroxaban (XARELTO) Education Kit for DVT/PE patients ( Does not apply Given 12/15/14 0955)  HYDROmorphone (DILAUDID) injection 1 mg (1 mg Intravenous Given 12/15/14 0951)    Labs Review  Labs Reviewed  CBC WITH DIFFERENTIAL/PLATELET - Abnormal; Notable for the following:    RBC 3.22 (*)    Hemoglobin 8.5 (*)    HCT 26.3 (*)    All other components within normal limits  BASIC METABOLIC PANEL - Abnormal; Notable for the following:    Potassium 3.3 (*)    BUN 41 (*)    Creatinine, Ser 1.68 (*)    GFR calc non Af Amer 44 (*)    GFR calc Af Amer 51 (*)    All other components within normal limits    Imaging Review No results found.   EKG Interpretation None      Left lower extremity venous duplex has been completed. Preliminary findings: Non-occlusive DVT noted in the left popliteal and peroneal veins. Appears to be acute vs subacute.  Patient case was discussed with Joanell Rising, PA-C of orthopedic services who has seen the patient in consultation.   MDM   Final diagnoses:  Left leg DVT    Filed Vitals:   12/15/14 1045  BP: 133/69  Pulse: 83  Temp:   Resp:    Afebrile, NAD, non-toxic appearing, AAOx4.  Neurovascularly intact. Normal sensation. No evidence of compartment syndrome. Patient presenting s/p total L knee replacement with warm, erythematous swollen left calf. Surgical incision is clean, dry, intact without drainage. Labs reviewed, mild elevation in creatinine seems baseline for patient from previous visits. Venous  duplex reveals left popliteal and peroneal vein DVT, non-occlusive. Pharmacy was consultative Will start patient on Xarelto. Pain and symptoms managed in emergency department. Orthopedics has seen and evaluated the patient. Parent agrees with discharge home on Xarelto. Return precautions discussed. Patient is agreeable to plan. Patient is stable at time of discharge   Patient d/w with Dr. Regenia Skeeter, agrees with plan.     Baron Sane, PA-C 12/15/14 [place], MD 12/18/14 [phone]

## 2014-12-15 NOTE — Progress Notes (Signed)
ANTICOAGULATION CONSULT NOTE - Initial Consult  Pharmacy Consult for xarelto  Indication: DVT  Allergies  Allergen Reactions  . Oxycodone Itching and Other (See Comments)    "Makes me feel Bad"    Patient Measurements: Height: 5' 11" (180.3 cm) Weight: 256 lb (116.121 kg) IBW/kg (Calculated) : 75.3   Vital Signs: Temp: 98.6 F (37 C) (03/18 0712) Temp Source: Oral (03/18 0712) BP: 145/66 mmHg (03/18 0715) Pulse Rate: 84 (03/18 0715)  Labs:  Recent Labs  12/13/14 0756 12/14/14 0720 12/15/14 0730  HGB 10.2* 9.9* 8.5*  HCT 31.2* 30.1* 26.3*  PLT 264 244 315  CREATININE  --   --  1.68*    Estimated Creatinine Clearance: 64.4 mL/min (by C-G formula based on Cr of 1.68).   Medical History: Past Medical History  Diagnosis Date  . Hypertension   . Diabetes mellitus without complication   . Arthritis   . Tuberculosis     tb positive 1989 ?   . Depression   . PONV (postoperative nausea and vomiting)   . Chronic kidney disease 02/2013    ACUTE RENAL FAILURE     Assessment: 54 yo man s/p L TKR on 12/11/14.  Was onSCDs and bid aspirin for VTE px. Pt in ED with LLE DVT.   Hx of DVT 20 years ago.   Wt 116 kg, creat 1.68, creat cl calc 81 ml/min.  H/H 8.5/26.3, pltc 315.  No bleeding reported.    Plan -xarelto 15 mg bid with meals x 21 days, then 20 mg qsupper to start Saturday 01/05/15 -Xarelto discharge kit given to pt and education completed   T. , Pharm.D. [phone] 12/15/2014 9:23 AM  

## 2014-12-15 NOTE — ED Notes (Signed)
PA  Piepenbrink at bedside.

## 2014-12-15 NOTE — ED Notes (Signed)
Pharmacist at bedside with patient reviewing DVT material.

## 2014-12-15 NOTE — Discharge Instructions (Signed)
Please follow up with your primary care physician in 1-2 days. If you do not have one please call the [facility] number listed above. Please follow up with Dr. Turner Daniels to schedule a follow up appointment.  Please stop the use of the aspirin and start the Xarelto as prescribed. Please read all discharge instructions and return precautions.    Deep Vein Thrombosis A deep vein thrombosis (DVT) is a blood clot that develops in the deep, larger veins of the leg, arm, or pelvis. These are more dangerous than clots that might form in veins near the surface of the body. A DVT can lead to serious and even life-threatening complications if the clot breaks off and travels in the bloodstream to the lungs.  A DVT can damage the valves in your leg veins so that instead of flowing upward, the blood pools in the lower leg. This is called post-thrombotic syndrome, and it can result in pain, swelling, discoloration, and sores on the leg. CAUSES Usually, several things contribute to the formation of blood clots. Contributing factors include:  The flow of blood slows down.  The inside of the vein is damaged in some way.  You have a condition that makes blood clot more easily. RISK FACTORS Some people are more likely than others to develop blood clots. Risk factors include:   Smoking.  Being overweight (obese).  Sitting or lying still for a long time. This includes long-distance travel, paralysis, or recovery from an illness or surgery. Other factors that increase risk are:   Older age, especially over 37 years of age.  Having a family history of blood clots or if you have already had a blot clot.  Having major or lengthy surgery. This is especially true for surgery on the hip, knee, or belly (abdomen). Hip surgery is particularly high risk.  Having a long, thin tube (catheter) placed inside a vein during a medical procedure.  Breaking a hip or leg.  Having cancer or cancer  treatment.  Pregnancy and childbirth.  Hormone changes make the blood clot more easily during pregnancy.  The fetus puts pressure on the veins of the pelvis.  There is a risk of injury to veins during delivery or a caesarean delivery. The risk is highest just after childbirth.  Medicines containing the male hormone estrogen. This includes birth control pills and hormone replacement therapy.  Other circulation or heart problems.  SIGNS AND SYMPTOMS When a clot forms, it can either partially or totally block the blood flow in that vein. Symptoms of a DVT can include:  Swelling of the leg or arm, especially if one side is much worse.  Warmth and redness of the leg or arm, especially if one side is much worse.  Pain in an arm or leg. If the clot is in the leg, symptoms may be more noticeable or worse when standing or walking. The symptoms of a DVT that has traveled to the lungs (pulmonary embolism, PE) usually start suddenly and include:  Shortness of breath.  Coughing.  Coughing up blood or blood-tinged mucus.  Chest pain. The chest pain is often worse with deep breaths.  Rapid heartbeat. Anyone with these symptoms should get emergency medical treatment right away. Do not wait to see if the symptoms will go away. Call your local emergency services (911 in the [place] if you have these symptoms. Do not drive yourself to the hospital. DIAGNOSIS If a DVT is suspected, your health care provider will take a  full medical history and perform a physical exam. Tests that also may be required include:  Blood tests, including studies of the clotting properties of the blood.  Ultrasound to see if you have clots in your legs or lungs.  X-rays to show the flow of blood when dye is injected into the veins (venogram).  Studies of your lungs if you have any chest symptoms. PREVENTION  Exercise the legs regularly. Take a brisk 30-minute walk every day.  Maintain a weight that is  appropriate for your height.  Avoid sitting or lying in bed for long periods of time without moving your legs.  Women, particularly those over the age of 35 years, should consider the risks and benefits of taking estrogen medicines, including birth control pills.  Do not smoke, especially if you take estrogen medicines.  Long-distance travel can increase your risk of DVT. You should exercise your legs by walking or pumping the muscles every hour.  Many of the risk factors above relate to situations that exist with hospitalization, either for illness, injury, or elective surgery. Prevention may include medical and nonmedical measures.  Your health care provider will assess you for the need for venous thromboembolism prevention when you are admitted to the hospital. If you are having surgery, your surgeon will assess you the day of or day after surgery. TREATMENT Once identified, a DVT can be treated. It can also be prevented in some circumstances. Once you have had a DVT, you may be at increased risk for a DVT in the future. The most common treatment for DVT is blood-thinning (anticoagulant) medicine, which reduces the blood's tendency to clot. Anticoagulants can stop new blood clots from forming and stop old clots from growing. They cannot dissolve existing clots. Your body does this by itself over time. Anticoagulants can be given by mouth, through an IV tube, or by injection. Your health care provider will determine the best program for you. Other medicines or treatments that may be used are:  Heparin or related medicines (low molecular weight heparin) are often the first treatment for a blood clot. They act quickly. However, they cannot be taken orally and must be given either in shot form or by IV tube.  Heparin can cause a fall in a component of blood that stops bleeding and forms blood clots (platelets). You will be monitored with blood tests to be sure this does not occur.  Warfarin is an  anticoagulant that can be swallowed. It takes a few days to start working, so usually heparin or related medicines are used in combination. Once warfarin is working, heparin is usually stopped.  Factor Xa inhibitor medicines, such as rivaroxaban and apixaban, also reduce blood clotting. These medicines are taken orally and can often be used without heparin or related medicines.  Less commonly, clot dissolving drugs (thrombolytics) are used to dissolve a DVT. They carry a high risk of bleeding, so they are used mainly in severe cases where your life or a part of your body is threatened.  Very rarely, a blood clot in the leg needs to be removed surgically.  If you are unable to take anticoagulants, your health care provider may arrange for you to have a filter placed in a main vein in your abdomen. This filter prevents clots from traveling to your lungs. HOME CARE INSTRUCTIONS  Take all medicines as directed by your health care provider.  Learn as much as you can about DVT.  Wear a medical alert bracelet or carry a  medical alert card.  Ask your health care provider how soon you can go back to normal activities. It is important to stay active to prevent blood clots. If you are on anticoagulant medicine, avoid contact sports.  It is very important to exercise. This is especially important while traveling, sitting, or standing for long periods of time. Exercise your legs by walking or by tightening and relaxing your leg muscles regularly. Take frequent walks.  You may need to wear compression stockings. These are tight elastic stockings that apply pressure to the lower legs. This pressure can help keep the blood in the legs from clotting. Taking Warfarin Warfarin is a daily medicine that is taken by mouth. Your health care provider will advise you on the length of treatment (usually 3-6 months, sometimes lifelong). If you take warfarin:  Understand how to take warfarin and foods that can affect  how warfarin works in Public relations account executive.  Too much and too little warfarin are both dangerous. Too much warfarin increases the risk of bleeding. Too little warfarin continues to allow the risk for blood clots. Warfarin and Regular Blood Testing While taking warfarin, you will need to have regular blood tests to measure your blood clotting time. These blood tests usually include both the prothrombin time (PT) and international normalized ratio (INR) tests. The PT and INR results allow your health care provider to adjust your dose of warfarin. It is very important that you have your PT and INR tested as often as directed by your health care provider.  Warfarin and Your Diet Avoid major changes in your diet, or notify your health care provider before changing your diet. Arrange a visit with a registered dietitian to answer your questions. Many foods, especially foods high in vitamin K, can interfere with warfarin and affect the PT and INR results. You should eat a consistent amount of foods high in vitamin K. Foods high in vitamin K include:   Spinach, kale, broccoli, cabbage, collard and turnip greens, Brussels sprouts, peas, cauliflower, seaweed, and parsley.  Beef and pork liver.  Green tea.  Soybean oil. Warfarin with Other Medicines Many medicines can interfere with warfarin and affect the PT and INR results. You must:  Tell your health care provider about any and all medicines, vitamins, and supplements you take, including aspirin and other over-the-counter anti-inflammatory medicines. Be especially cautious with aspirin and anti-inflammatory medicines. Ask your health care provider before taking these.  Do not take or discontinue any prescribed or over-the-counter medicine except on the advice of your health care provider or pharmacist. Warfarin Side Effects Warfarin can have side effects, such as easy bruising and difficulty stopping bleeding. Ask your health care provider or pharmacist about  other side effects of warfarin. You will need to:  Hold pressure over cuts for longer than usual.  Notify your dentist and other health care providers that you are taking warfarin before you undergo any procedures where bleeding may occur. Warfarin with Alcohol and Tobacco   Drinking alcohol frequently can increase the effect of warfarin, leading to excess bleeding. It is best to avoid alcoholic drinks or to consume only very small amounts while taking warfarin. Notify your health care provider if you change your alcohol intake.   Do not use any tobacco products including cigarettes, chewing tobacco, or electronic cigarettes. If you smoke, quit. Ask your health care provider for help with quitting smoking. Alternative Medicines to Warfarin: Factor Xa Inhibitor Medicines  These blood-thinning medicines are taken by mouth, usually for  several weeks or longer. It is important to take the medicine every single day at the same time each day.  There are no regular blood tests required when using these medicines.  There are fewer food and drug interactions than with warfarin.  The side effects of this class of medicine are similar to those of warfarin, including excessive bruising or bleeding. Ask your health care provider or pharmacist about other potential side effects. SEEK MEDICAL CARE IF:  You notice a rapid heartbeat.  You feel weaker or more tired than usual.  You feel faint.  You notice increased bruising.  You feel your symptoms are not getting better in the time expected.  You believe you are having side effects of medicine. SEEK IMMEDIATE MEDICAL CARE IF:  You have chest pain.  You have trouble breathing.  You have new or increased swelling or pain in one leg.  You cough up blood.  You notice blood in vomit, in a bowel movement, or in urine. MAKE SURE YOU:  Understand these instructions.  Will watch your condition.  Will get help right away if you are not doing  well or get worse. Document Released: 09/15/2005 Document Revised: 01/30/2014 Document Reviewed: 05/23/2013 [facility] Patient Information 2015 [place], [place]. This information is not intended to replace advice given to you by your health care provider. Make sure you discuss any questions you have with your health care provider.

## 2014-12-15 NOTE — ED Notes (Signed)
Patient placed on 2L nasal cannula with pain medication.

## 2014-12-15 NOTE — Progress Notes (Signed)
*  PRELIMINARY RESULTS* Vascular Ultrasound Left lower extremity venous duplex has been completed.  Preliminary findings: Non-occlusive DVT noted in the left popliteal and peroneal veins. Appears to be acute vs subacute.    Farrel DemarkJill Eunice, RDMS, RVT  12/15/2014, 8:37 AM

## 2014-12-15 NOTE — ED Notes (Signed)
2L Nasal Cannula removed.  Patient maintaining O2 at 95% room air.

## 2014-12-15 NOTE — Consult Note (Signed)
PATIENT ID: Angel Phillips  MRN: [id number]  DOB/AGE:  Dec 11, 1960 / 54 y.o.     Consult NOTE History:  Pt presented with acute left calf pain and 10/10 pain.  He is status post left TKA performed by Dr.Rowan on 12/11/14.  He was discharged to [facility] on 12/14/14 for rehab.  Pt was taken to [facility] where he was examined by Dr. Regenia Skeeter VDUS was ordered.  He had a VDUS showing a non occlusive DVT of the left popliteal and peroneal veins.  Objective: Vital signs in last 24 hours: Temp:  [98.6 F (37 C)-98.7 F (37.1 C)] 98.7 F (37.1 C) (03/18 0922) Pulse Rate:  [80-87] 81 (03/18 0930) Resp:  [8-23] 15 (03/18 0930) BP: (140-160)/(62-77) 140/72 mmHg (03/18 0930) SpO2:  [92 %-99 %] 93 % (03/18 0930) Weight:  [116.121 kg (256 lb)] 116.121 kg (256 lb) (03/18 0712)    Intake/Output from previous day:     Intake/Output this shift: Total I/O In: 1000 [IV Piggyback:1000] Out: -    LABORATORY DATA:  Recent Labs  12/13/14 2130 12/14/14 0638 12/14/14 0720 12/14/14 1159 12/15/14 0730  WBC  --   --  8.0  --  6.1  HGB  --   --  9.9*  --  8.5*  HCT  --   --  30.1*  --  26.3*  PLT  --   --  244  --  315  NA  --   --   --   --  138  K  --   --   --   --  3.3*  CL  --   --   --   --  103  CO2  --   --   --   --  27  BUN  --   --   --   --  41*  CREATININE  --   --   --   --  1.68*  GLUCOSE  --   --   --   --  87  GLUCAP 139* 105*  --  142*  --   CALCIUM  --   --   --   --  8.4   Left lower extremity venous duplex has been completed. Preliminary findings: Non-occlusive DVT noted in the left popliteal and peroneal veins. Appears to be acute vs subacute.  Examination: Neurologically intact Sensation intact distally Intact pulses distally Dorsiflexion/Plantar flexion intact Incision: no drainage Pt's right calf is large and tense.  pain with plantar flexion and dorsiflexion.}  Assessment:   Non-occlusive DVT noted in the left popliteal and peroneal veins S/P left TKA on  12/11/14  ADDITIONAL DIAGNOSIS:  Diabetes and Hypertension  Plan:  Weight Bearing as Tolerated (WBAT)  DVT Prophylaxis:  -xarelto 15 mg bid with meals x 21 days, then 20 mg qsupper to start Saturday 01/05/15 -Xarelto discharge kit given to pt and education completed  DISCHARGE PLAN: Skilled Nursing Facility/Rehab.  Plan is to send pt back to [facility] for rehab.  Warm compresses and elevation.  Pt will need to follow up in office with Dr. Mayer Camel next Tuesday.    Janelle Culton R 12/15/2014, 10:07 AM

## 2014-12-15 NOTE — Progress Notes (Addendum)
Patient ID: Angel Phillips, male   DOB: 1961/03/23, 54 y.o.   MRN: [id number]   12/15/2014  Facility:  Nursing Home Location:  [facility] Room Number: 202-1 LEVEL OF CARE:  SNF (31)   Chief Complaint  Patient presents with  . Hospitalization Follow-up    Osteoarthritis S/P left total knee arthroplasty, hypertension, neuropathy, diabetes mellitus, depression, hyperlipidemia and DVT    HISTORY OF PRESENT ILLNESS:  This is a 54 year old male who has been [facility] to [facility] 12/14/14 from [facility] with osteoarthritis status post left total knee arthroplasty. He has PMH of ETOH abuse, major depression, complicated bereavement, diabetes mellitus, hypertension and chronic kidney disease. He was transferred to the hospital early this morning, 12/15/14, due to edema of lower extremity. He was diagnosed with DVT of left leg and was prescribed Xarelto.  He has been admitted for a short-term rehabilitation.  PAST MEDICAL HISTORY:  Past Medical History  Diagnosis Date  . Hypertension   . Diabetes mellitus without complication   . Arthritis   . Tuberculosis     tb positive 1989 ?   Marland Kitchen. Depression   . PONV (postoperative nausea and vomiting)   . Chronic kidney disease 02/2013    ACUTE RENAL FAILURE    CURRENT MEDICATIONS: Reviewed per MAR/see medication list  Allergies  Allergen Reactions  . Oxycodone Itching and Other (See Comments)    "Makes me feel Bad"     REVIEW OF SYSTEMS:  GENERAL: no change in appetite, no fatigue, no weight changes, no fever, chills or weakness RESPIRATORY: no cough, SOB, DOE, wheezing, hemoptysis CARDIAC: no chest pain, or palpitations GI: no abdominal pain, diarrhea, constipation, heart burn, nausea or vomiting  PHYSICAL EXAMINATION  GENERAL: no acute distress, obese SKIN:  Left knee surgical incision is covered with dry dressing, dry, no erythema EYES: conjunctivae normal, sclerae normal, normal eye  lids NECK: supple, trachea midline, no neck masses, no thyroid tenderness, no thyromegaly LYMPHATICS: no LAN in the neck, no supraclavicular LAN RESPIRATORY: breathing is even & unlabored, BS CTAB CARDIAC: RRR, no murmur,no extra heart sounds, LLE edema 2+ GI: abdomen soft, normal BS, no masses, no tenderness, no hepatomegaly, no splenomegaly EXTREMITIES: Able to move 4 extremities PSYCHIATRIC: the patient is alert & oriented to person, affect & behavior appropriate  LABS/RADIOLOGY: Labs reviewed: Basic Metabolic Panel:  Recent Labs  40/98/1102/23/16 1151 12/06/14 1353 12/15/14 0730  NA 134* 141 138  K 2.8* 3.7 3.3*  CL 97 103 103  CO2 23 30 27   GLUCOSE 397* 144* 87  BUN 21 27* 41*  CREATININE 1.27 1.28 1.68*  CALCIUM 9.0 9.0 8.4   Liver Function Tests:  Recent Labs  11/21/14 0740 11/21/14 1151  AST 46* 42*  ALT 60* 63*  ALKPHOS 67 62  BILITOT 0.8 0.7  PROT 6.9 7.5  ALBUMIN 3.5 3.5   CBC:  Recent Labs  12/06/14 1353  12/13/14 0756 12/14/14 0720 12/15/14 0730  WBC 6.7  < > 9.3 8.0 6.1  NEUTROABS 4.4  --   --   --  4.6  HGB 13.3  < > 10.2* 9.9* 8.5*  HCT 40.3  < > 31.2* 30.1* 26.3*  MCV 83.3  < > 83.0 82.0 81.7  PLT 362  < > 264 244 315  < > = values in this interval not displayed. CBG:  Recent Labs  12/13/14 2130 12/14/14 0638 12/14/14 1159  GLUCAP 139* 105* 142*    Dg Chest 2  View  12/06/2014   CLINICAL DATA:  Preoperative evaluation for total knee arthroplasty, history hypertension, diabetes, TB  EXAM: CHEST  2 VIEW  COMPARISON:  03/14/2013  FINDINGS: Upper normal heart size.  Normal mediastinal contours and pulmonary vascularity.  RIGHT basilar atelectasis and central peribronchial thickening.  No acute infiltrate, pleural effusion or pneumothorax.  Lung apices clear.  Prior thoracolumbar spinal fixation.  IMPRESSION: Bronchitic changes with RIGHT basilar atelectasis.   Electronically Signed   By: Ulyses Southward M.D.   On: 12/06/2014 14:44     ASSESSMENT/PLAN:  Osteoarthritis S/P left total knee arthroplasty - for rehabilitation; continue Flexeril 5 mg 1 tab by mouth 3 times a day when necessary for muscle spasm; and change Tylenol to 500 mg 2 tabs = 1000 mg by mouth every 8 hours when necessary for pain Hypertension - continue Norvasc 10 mg 1 tab by mouth daily, Hygroton 25 mg by mouth daily and lisinopril 20 mg 1/2 tab = 10 mg by mouth daily Neuropathy - continue Neurontin 300 mg 2 capsules = 600 mg by mouth twice a day Diabetes mellitus, type II with neuropathy - hemoglobin A1c 10.8; continue Glucotrol 2.5 mg by mouth twice a day, Lantus 10 units subcutaneous daily and metformin 750 mg by mouth twice a day Depression - mood is stable; continue Zoloft 100 mg by mouth daily Hyperlipidemia - continue Zocor 20 mg by mouth daily at bedtime DVT left leg - will start on Xarelto 15 mg 1 tab by mouth twice a day 21 days then 20 mg by mouth daily    Goals of care:  Short-term rehabilitation   Labs/test ordered:  CBC and BMP  Spent 50 minutes in patient care.    [facility], NP [employer] [phone]

## 2014-12-15 NOTE — ED Notes (Signed)
EMS - [facility] from [facility] with sudden onset of knee pain 10/10 post left knee surgery on Monday.  Left leg is swollen and warm to the touch.  Patient states "I was able to do my physical therapy yesterday with no issues".

## 2014-12-19 ENCOUNTER — Other Ambulatory Visit: Payer: Self-pay | Admitting: *Deleted

## 2014-12-19 DIAGNOSIS — Z471 Aftercare following joint replacement surgery: Secondary | ICD-10-CM | POA: Diagnosis not present

## 2014-12-19 MED ORDER — HYDROMORPHONE HCL 2 MG PO TABS: ORAL_TABLET | ORAL | Status: DC

## 2014-12-19 NOTE — Telephone Encounter (Signed)
[facility] 

## 2014-12-20 ENCOUNTER — Non-Acute Institutional Stay (SKILLED_NURSING_FACILITY): Payer: Medicare Other | Admitting: Internal Medicine

## 2014-12-20 DIAGNOSIS — F32A Depression, unspecified: Secondary | ICD-10-CM

## 2014-12-20 DIAGNOSIS — F329 Major depressive disorder, single episode, unspecified: Secondary | ICD-10-CM | POA: Diagnosis not present

## 2014-12-20 DIAGNOSIS — E785 Hyperlipidemia, unspecified: Secondary | ICD-10-CM | POA: Diagnosis not present

## 2014-12-20 DIAGNOSIS — E1140 Type 2 diabetes mellitus with diabetic neuropathy, unspecified: Secondary | ICD-10-CM

## 2014-12-20 DIAGNOSIS — K5900 Constipation, unspecified: Secondary | ICD-10-CM | POA: Diagnosis not present

## 2014-12-20 DIAGNOSIS — M792 Neuralgia and neuritis, unspecified: Secondary | ICD-10-CM | POA: Diagnosis not present

## 2014-12-20 DIAGNOSIS — N179 Acute kidney failure, unspecified: Secondary | ICD-10-CM

## 2014-12-20 DIAGNOSIS — I10 Essential (primary) hypertension: Secondary | ICD-10-CM | POA: Diagnosis not present

## 2014-12-20 DIAGNOSIS — M1712 Unilateral primary osteoarthritis, left knee: Secondary | ICD-10-CM | POA: Diagnosis not present

## 2014-12-20 DIAGNOSIS — I82402 Acute embolism and thrombosis of unspecified deep veins of left lower extremity: Secondary | ICD-10-CM

## 2014-12-20 DIAGNOSIS — D62 Acute posthemorrhagic anemia: Secondary | ICD-10-CM | POA: Diagnosis not present

## 2014-12-20 NOTE — Progress Notes (Signed)
Patient ID: Angel Phillips, male   DOB: Jun 30, 1961, 54 y.o.   MRN: [id number]     [facility]   PCP: [facility], MD  Code Status: full code  Allergies  Allergen Reactions  . Oxycodone Itching and Other (See Comments)    "Makes me feel Bad"    Chief Complaint  Patient presents with  . New Admit To SNF     HPI:  54 year old patient is here for short term rehabilitation post hospital admission from 12/11/14-12/14/14 with left knee OA. He underwent left total knee arthroplasty. He is seen in his room today. His pain is under control but has muscle spasm and tightness. Had bowel movement 2 days back. At baseline, he has a bowel movement everyday. Denies any other concerns  Review of Systems:  Constitutional: Negative for fever, chills, diaphoresis.  HENT: Negative for headache, congestion Eyes: Negative for eye pain, blurred vision, double vision and discharge.  Respiratory: Negative for cough, shortness of breath and wheezing.   Cardiovascular: Negative for chest pain, palpitations, leg swelling.  Gastrointestinal: Negative for heartburn, nausea, vomiting, abdominal pain Genitourinary: Negative for dysuria, urgency and flank pain.  Musculoskeletal: Negative for back pain, falls Skin: Negative for itching, rash.  Neurological: Negative for weakness Psychiatric/Behavioral: Negative for depression    Past Medical History  Diagnosis Date  . Hypertension   . Diabetes mellitus without complication   . Arthritis   . Tuberculosis     tb positive 1989 ?   Marland Kitchen Depression   . PONV (postoperative nausea and vomiting)   . Chronic kidney disease 02/2013    ACUTE RENAL FAILURE   Past Surgical History  Procedure Laterality Date  . Knee arthroscopy    . Gastric bypass    . Spinal fusion      Harrington Rod  . Total knee arthroplasty Left 12/11/2014    Procedure: TOTAL KNEE ARTHROPLASTY;  Surgeon: Gean Birchwood, MD;  Location: [facility] OR;  Service:  Orthopedics;  Laterality: Left;   Social History:   reports that he has never smoked. He has never used smokeless tobacco. He reports that he does not drink alcohol or use illicit drugs.  No family history on file.  Medications: Patient's Medications  New Prescriptions   No medications on file  Previous Medications   ACETAMINOPHEN (TYLENOL) 500 MG TABLET    Take 2,000 mg by mouth every 2 (two) hours as needed for mild pain or headache.   AMLODIPINE (NORVASC) 10 MG TABLET    Take 1 tablet (10 mg total) by mouth daily. For high blood pressure control   CHLORTHALIDONE (HYGROTON) 25 MG TABLET    Take 1 tablet (25 mg total) by mouth daily. For control of HTN/diabetes mellitus   CYANOCOBALAMIN (VITAMIN B-12 IJ)    Inject as directed every 30 (thirty) days.   CYCLOBENZAPRINE (FLEXERIL) 5 MG TABLET    Take 1 tablet (5 mg total) by mouth 3 (three) times daily as needed for muscle spasms. For muscle spasm   GABAPENTIN (NEURONTIN) 300 MG CAPSULE    Take 2 capsules (600 mg total) by mouth 2 (two) times daily. For pain/anxiety   GLIPIZIDE (GLUCOTROL) 10 MG TABLET    Take 2.5 mg by mouth 2 (two) times daily before a meal.    HYDROMORPHONE (DILAUDID) 2 MG TABLET    Take two tablets by mouth every 6 hours as needed for severe pain; Take one tablet by mouth every 6 hours as needed for less severe  pain   INSULIN GLARGINE (LANTUS) 100 UNIT/ML INJECTION    Inject 10 Units into the skin daily.   LIDOCAINE (LIDODERM) 5 %    Place 1 patch onto the skin daily. Remove & Discard patch within 12 hours or as directed by MD: For pain management   LISINOPRIL (PRINIVIL,ZESTRIL) 20 MG TABLET    Take 0.5 tablets (10 mg total) by mouth daily. For high blood pressure control   METFORMIN (GLUCOPHAGE) 500 MG TABLET    Take 750 mg by mouth 2 (two) times daily with a meal.   SERTRALINE (ZOLOFT) 100 MG TABLET    Take 100 mg by mouth daily.   SIMVASTATIN (ZOCOR) 40 MG TABLET    Take 20 mg by mouth at bedtime.   XARELTO STARTER  PACK 15 & 20 MG TBPK    Take 15-20 mg by mouth as directed. Take as directed on package: Start with one  tablet by mouth twice a day with food. On Day 22, switch to one  tablet once a day with food.  Modified Medications   No medications on file  Discontinued Medications   No medications on file     Physical Exam:  Filed Vitals:   12/20/14 1426  BP: 131/83  Pulse: 88  Temp: 97.7 F (36.5 C)  Resp: 18  SpO2: 93%    General- adult male, obese, in no acute distress Head- normocephalic, atraumatic Throat- moist mucus membrane Neck- no cervical lymphadenopathy Cardiovascular- normal s1,s2, no murmurs, palpable dorsalis pedis and radial pulses, trace leg edema Respiratory- bilateral clear to auscultation, no wheeze, no rhonchi, no crackles, no use of accessory muscles Abdomen- bowel sounds present, soft, non tender Musculoskeletal- able to move all 4 extremities, limited ROM left knee  Neurological- no focal deficit Skin- warm and dry, surgical incision in left knee healing well, open to air Psychiatry- alert and oriented to person, place and time, normal mood and affect    Labs reviewed: Basic Metabolic Panel:  Recent Labs  45/40/98 1151 12/06/14 1353 12/15/14 0730  NA 134* 141 138  K 2.8* 3.7 3.3*  CL 97 103 103  CO2 GLUCOSE 397* 144* 87  BUN 21 27* 41*  CREATININE 1.27 1.28 1.68*  CALCIUM 9.0 9.0 8.4   Liver Function Tests:  Recent Labs  11/21/14 0740 11/21/14 1151  AST 46* 42*  ALT 60* 63*  ALKPHOS 67 62  BILITOT 0.8 0.7  PROT 6.9 7.5  ALBUMIN 3.5 3.5   No results for input(s): LIPASE, AMYLASE in the last 8760 hours. No results for input(s): AMMONIA in the last 8760 hours. CBC:  Recent Labs  12/06/14 1353  12/13/14 0756 12/14/14 0720 12/15/14 0730  WBC 6.7  < > 9.3 8.0 6.1  NEUTROABS 4.4  --   --   --  4.6  HGB 13.3  < > 10.2* 9.9* 8.5*  HCT 40.3  < > 31.2* 30.1* 26.3*  MCV 83.3  < > 83.0 82.0 81.7  PLT 362  < > 264 244  315  < > = values in this interval not displayed. Cardiac Enzymes: No results for input(s): CKTOTAL, CKMB, CKMBINDEX, TROPONINI in the last 8760 hours. BNP: Invalid input(s): POCBNP CBG:  Recent Labs  12/13/14 2130 12/14/14 0638 12/14/14 1159  GLUCAP 139* 105* 142*    Assessment/Plan  Left knee Osteoarthritis  S/P left total knee arthroplasty. Will have him work with physical therapy and occupational therapy team to help with gait training and muscle strengthening  exercises.fall precautions. Skin care. Encourage to be out of bed. Change flexeril to 10 mg bid for now. Continue dilaudid 4 mg q6h prn for pain. Continue xarelto for dvt prophylaxis. Has follow up with Dr Turner Danielsowan  Left leg DVT Continue xarelto for now  HTN continue Norvasc 10 mg daily, chlorthalidone 25 mg daily and lisinopril 10 mg daily  Dm type 2 Monitor cbg, continue lantus 10 u with metformin 750 mg bid and glucotrol 2.5 mg bid. Continue zocor  Constipation Add colace 100 mg bid and miralax 17 g daily for now  Anemia Blood loss anemia post op, monitor cbc  ARF Encouraged hydration, monitor bmp  Neuropathic pain continue Neurontin 600 mg bid  Depression continue Zoloft 100 mg daily  Hyperlipidemia continue Zocor 20 mg daily   Goals of care: short term rehabilitation   Labs/tests ordered: cbc, bmp  Family/ staff Communication: reviewed care plan with patient and nursing supervisor    Oneal GroutMAHIMA Laisa Larrick, MD  [facility] [phone] (Monday-Friday 8 am - 5 pm) [phone] (afterhours)

## 2014-12-21 ENCOUNTER — Encounter: Payer: Self-pay | Admitting: Adult Health

## 2014-12-21 ENCOUNTER — Non-Acute Institutional Stay (SKILLED_NURSING_FACILITY): Payer: Medicare Other | Admitting: Adult Health

## 2014-12-21 DIAGNOSIS — I82402 Acute embolism and thrombosis of unspecified deep veins of left lower extremity: Secondary | ICD-10-CM

## 2014-12-21 DIAGNOSIS — M792 Neuralgia and neuritis, unspecified: Secondary | ICD-10-CM | POA: Diagnosis not present

## 2014-12-21 DIAGNOSIS — F329 Major depressive disorder, single episode, unspecified: Secondary | ICD-10-CM

## 2014-12-21 DIAGNOSIS — E1140 Type 2 diabetes mellitus with diabetic neuropathy, unspecified: Secondary | ICD-10-CM

## 2014-12-21 DIAGNOSIS — F32A Depression, unspecified: Secondary | ICD-10-CM

## 2014-12-21 DIAGNOSIS — K5900 Constipation, unspecified: Secondary | ICD-10-CM

## 2014-12-21 DIAGNOSIS — M1712 Unilateral primary osteoarthritis, left knee: Secondary | ICD-10-CM | POA: Diagnosis not present

## 2014-12-21 DIAGNOSIS — E785 Hyperlipidemia, unspecified: Secondary | ICD-10-CM | POA: Diagnosis not present

## 2014-12-21 DIAGNOSIS — I10 Essential (primary) hypertension: Secondary | ICD-10-CM | POA: Diagnosis not present

## 2014-12-21 NOTE — Progress Notes (Signed)
Patient ID: Angel Phillips, male   DOB: [birth year], 54 y.o.   MRN: [id number]   12/21/2014  Facility:  Nursing Home Location:  [facility] Room Number: 202-1 LEVEL OF CARE:  SNF (31)   Chief Complaint  Patient presents with  . Discharge Note    Osteoarthritis S/P left total knee arthroplasty, hypertension, neuropathy, diabetes mellitus, depression, hyperlipidemia and DVT    HISTORY OF PRESENT ILLNESS:  This is a 54 year old male who is for discharge home with Home health PT. DME: standard rolling walker. [facility] with osteoarthritis status post left total knee arthroplasty. He has PMH of ETOH abuse, major depression, complicated bereavement, diabetes mellitus, hypertension and chronic kidney disease. He was transferred to the hospital on 12/15/14, due to edema of lower extremity. He was diagnosed with DVT of left leg and was prescribed Xarelto and then [facility].  Patient was admitted to this facility for short-term rehabilitation after the patient's recent hospitalization.  Patient has completed SNF rehabilitation and therapy has cleared the patient for discharge.  PAST MEDICAL HISTORY:  Past Medical History  Diagnosis Date  . Hypertension   . Diabetes mellitus without complication   . Arthritis   . Tuberculosis     tb positive 1989 ?   Marland Kitchen. Depression   . PONV (postoperative nausea and vomiting)   . Chronic kidney disease 02/2013    ACUTE RENAL FAILURE    CURRENT MEDICATIONS: Reviewed per MAR/see medication list  Allergies  Allergen Reactions  . Oxycodone Itching and Other (See Comments)    "Makes me feel Bad"     REVIEW OF SYSTEMS:  GENERAL: no change in appetite, no fatigue, no weight changes, no fever, chills or weakness RESPIRATORY: no cough, SOB, DOE, wheezing, hemoptysis CARDIAC: no chest pain, or palpitations GI: no abdominal pain, diarrhea, constipation, heart  burn, nausea or vomiting  PHYSICAL EXAMINATION  GENERAL: no acute distress, obese SKIN:  Left knee surgical incision is dry , no erythema NECK: supple, trachea midline, no neck masses, no thyroid tenderness, no thyromegaly LYMPHATICS: no LAN in the neck, no supraclavicular LAN RESPIRATORY: breathing is even & unlabored, BS CTAB CARDIAC: RRR, no murmur,no extra heart sounds, LLE edema 2+ GI: abdomen soft, normal BS, no masses, no tenderness, no hepatomegaly, no splenomegaly EXTREMITIES: Able to move 4 extremities PSYCHIATRIC: the patient is alert & oriented to person, affect & behavior appropriate  LABS/RADIOLOGY: 12/18/14  WBC 7.5 hemoglobin 10.6 hematocrit 32.6 MCV 82.7 sodium 137 potassium 3.7 glucose 202 BUN 14 creatinine 1.01 calcium 9.1 12/15/14  chest x-ray shows no radiographic evidence of active tuberculosis. Labs reviewed: Basic Metabolic Panel:  Recent Labs  13/24/4002/23/16 1151 12/06/14 1353 12/15/14 0730  NA 134* 141 138  K 2.8* 3.7 3.3*  CL 97 103 103  CO2 23 30 27   GLUCOSE 397* 144* 87  BUN 21 27* 41*  CREATININE 1.27 1.28 1.68*  CALCIUM 9.0 9.0 8.4   Liver Function Tests:  Recent Labs  11/21/14 0740 11/21/14 1151  AST 46* 42*  ALT 60* 63*  ALKPHOS 67 62  BILITOT 0.8 0.7  PROT 6.9 7.5  ALBUMIN 3.5 3.5   CBC:  Recent Labs  12/06/14 1353  12/13/14 0756 12/14/14 0720 12/15/14 0730  WBC 6.7  < > 9.3 8.0 6.1  NEUTROABS 4.4  --   --   --  4.6  HGB 13.3  < > 10.2* 9.9* 8.5*  HCT 40.3  < > 31.2* 30.1* 26.3*  MCV 83.3  < > 83.0 82.0 81.7  PLT 362  < > 264 244 315  < > = values in this interval not displayed. CBG:  Recent Labs  12/13/14 2130 12/14/14 0638 12/14/14 1159  GLUCAP 139* 105* 142*    Dg Chest 2 View  12/06/2014   CLINICAL DATA:  Preoperative evaluation for total knee arthroplasty, history hypertension, diabetes, TB  EXAM: CHEST  2 VIEW  COMPARISON:  03/14/2013  FINDINGS: Upper normal heart size.  Normal mediastinal contours and pulmonary  vascularity.  RIGHT basilar atelectasis and central peribronchial thickening.  No acute infiltrate, pleural effusion or pneumothorax.  Lung apices clear.  Prior thoracolumbar spinal fixation.  IMPRESSION: Bronchitic changes with RIGHT basilar atelectasis.   Electronically Signed   By: Ulyses Southward M.D.   On: 12/06/2014 14:44    ASSESSMENT/PLAN:  Osteoarthritis S/P left total knee arthroplasty - for home health PT; continue Flexeril 5 mg 2 tabs = 10 by mouth 2 times a day when necessary for muscle spasm; and change Tylenol to 500 mg 2 tabs = 1000 mg by mouth every 8 hours when necessary and Dilaudid 2 mg 2 tabs PO Q 6 hours PRN for pain Hypertension - continue Norvasc 10 mg 1 tab by mouth daily, Hygroton 25 mg by mouth daily and lisinopril 20 mg 1/2 tab = 10 mg by mouth daily Neuropathy - continue Neurontin 300 mg 2 capsules = 600 mg by mouth twice a day Diabetes mellitus, type II with neuropathy - hemoglobin A1c 10.8; continue Glucotrol 2.5 mg by mouth twice a day, Lantus 10 units subcutaneous daily and metformin 750 mg by mouth twice a day Depression - mood is stable; continue Zoloft 100 mg by mouth daily Hyperlipidemia - continue Zocor 20 mg by mouth daily at bedtime DVT left leg - will start on Xarelto 15 mg 1 tab by mouth twice a day 21 days then 20 mg by mouth daily Constipation - continue Colace 100 mg by mouth twice a day and MiraLAX 17 g by mouth daily   I have filled out patient's discharge paperwork and written prescriptions.  Patient will receive home health PT.  DME provided: Rolling walker  Total discharge time: Greater than 30 minutes  Discharge time involved coordination of the discharge process with social worker, nursing staff and therapy department. Medical justification for home health services/DME verified.    [facility], NP [employer] [phone]

## 2014-12-22 DIAGNOSIS — Z471 Aftercare following joint replacement surgery: Secondary | ICD-10-CM | POA: Diagnosis not present

## 2014-12-22 DIAGNOSIS — E1165 Type 2 diabetes mellitus with hyperglycemia: Secondary | ICD-10-CM | POA: Diagnosis not present

## 2014-12-22 DIAGNOSIS — D649 Anemia, unspecified: Secondary | ICD-10-CM | POA: Diagnosis not present

## 2014-12-22 DIAGNOSIS — F329 Major depressive disorder, single episode, unspecified: Secondary | ICD-10-CM | POA: Diagnosis not present

## 2014-12-22 DIAGNOSIS — Z96652 Presence of left artificial knee joint: Secondary | ICD-10-CM | POA: Diagnosis not present

## 2014-12-22 DIAGNOSIS — I10 Essential (primary) hypertension: Secondary | ICD-10-CM | POA: Diagnosis not present

## 2014-12-22 DIAGNOSIS — Z794 Long term (current) use of insulin: Secondary | ICD-10-CM | POA: Diagnosis not present

## 2014-12-23 DIAGNOSIS — Z471 Aftercare following joint replacement surgery: Secondary | ICD-10-CM | POA: Diagnosis not present

## 2014-12-23 DIAGNOSIS — Z96652 Presence of left artificial knee joint: Secondary | ICD-10-CM | POA: Diagnosis not present

## 2014-12-23 DIAGNOSIS — E1165 Type 2 diabetes mellitus with hyperglycemia: Secondary | ICD-10-CM | POA: Diagnosis not present

## 2014-12-23 DIAGNOSIS — F329 Major depressive disorder, single episode, unspecified: Secondary | ICD-10-CM | POA: Diagnosis not present

## 2014-12-23 DIAGNOSIS — D649 Anemia, unspecified: Secondary | ICD-10-CM | POA: Diagnosis not present

## 2014-12-23 DIAGNOSIS — I10 Essential (primary) hypertension: Secondary | ICD-10-CM | POA: Diagnosis not present

## 2014-12-24 DIAGNOSIS — I10 Essential (primary) hypertension: Secondary | ICD-10-CM | POA: Diagnosis not present

## 2014-12-24 DIAGNOSIS — F329 Major depressive disorder, single episode, unspecified: Secondary | ICD-10-CM | POA: Diagnosis not present

## 2014-12-24 DIAGNOSIS — Z471 Aftercare following joint replacement surgery: Secondary | ICD-10-CM | POA: Diagnosis not present

## 2014-12-24 DIAGNOSIS — E1165 Type 2 diabetes mellitus with hyperglycemia: Secondary | ICD-10-CM | POA: Diagnosis not present

## 2014-12-24 DIAGNOSIS — Z96652 Presence of left artificial knee joint: Secondary | ICD-10-CM | POA: Diagnosis not present

## 2014-12-24 DIAGNOSIS — D649 Anemia, unspecified: Secondary | ICD-10-CM | POA: Diagnosis not present

## 2014-12-25 DIAGNOSIS — E1165 Type 2 diabetes mellitus with hyperglycemia: Secondary | ICD-10-CM | POA: Diagnosis not present

## 2014-12-25 DIAGNOSIS — M25562 Pain in left knee: Secondary | ICD-10-CM | POA: Diagnosis not present

## 2014-12-25 DIAGNOSIS — F329 Major depressive disorder, single episode, unspecified: Secondary | ICD-10-CM | POA: Diagnosis not present

## 2014-12-25 DIAGNOSIS — Z471 Aftercare following joint replacement surgery: Secondary | ICD-10-CM | POA: Diagnosis not present

## 2014-12-25 DIAGNOSIS — I10 Essential (primary) hypertension: Secondary | ICD-10-CM | POA: Diagnosis not present

## 2014-12-25 DIAGNOSIS — D649 Anemia, unspecified: Secondary | ICD-10-CM | POA: Diagnosis not present

## 2014-12-25 DIAGNOSIS — Z96652 Presence of left artificial knee joint: Secondary | ICD-10-CM | POA: Diagnosis not present

## 2014-12-26 DIAGNOSIS — E1165 Type 2 diabetes mellitus with hyperglycemia: Secondary | ICD-10-CM | POA: Diagnosis not present

## 2014-12-26 DIAGNOSIS — Z471 Aftercare following joint replacement surgery: Secondary | ICD-10-CM | POA: Diagnosis not present

## 2014-12-26 DIAGNOSIS — F329 Major depressive disorder, single episode, unspecified: Secondary | ICD-10-CM | POA: Diagnosis not present

## 2014-12-26 DIAGNOSIS — D649 Anemia, unspecified: Secondary | ICD-10-CM | POA: Diagnosis not present

## 2014-12-26 DIAGNOSIS — I10 Essential (primary) hypertension: Secondary | ICD-10-CM | POA: Diagnosis not present

## 2014-12-26 DIAGNOSIS — Z96652 Presence of left artificial knee joint: Secondary | ICD-10-CM | POA: Diagnosis not present

## 2014-12-28 DIAGNOSIS — M25662 Stiffness of left knee, not elsewhere classified: Secondary | ICD-10-CM | POA: Diagnosis not present

## 2014-12-28 DIAGNOSIS — M25562 Pain in left knee: Secondary | ICD-10-CM | POA: Diagnosis not present

## 2014-12-28 DIAGNOSIS — R262 Difficulty in walking, not elsewhere classified: Secondary | ICD-10-CM | POA: Diagnosis not present

## 2014-12-28 DIAGNOSIS — Z96652 Presence of left artificial knee joint: Secondary | ICD-10-CM | POA: Diagnosis not present

## 2015-01-02 DIAGNOSIS — Z471 Aftercare following joint replacement surgery: Secondary | ICD-10-CM | POA: Diagnosis not present

## 2015-01-02 DIAGNOSIS — Z96659 Presence of unspecified artificial knee joint: Secondary | ICD-10-CM | POA: Diagnosis not present

## 2015-01-02 DIAGNOSIS — M25662 Stiffness of left knee, not elsewhere classified: Secondary | ICD-10-CM | POA: Diagnosis not present

## 2015-01-02 DIAGNOSIS — Z96652 Presence of left artificial knee joint: Secondary | ICD-10-CM | POA: Diagnosis not present

## 2015-01-02 DIAGNOSIS — M25562 Pain in left knee: Secondary | ICD-10-CM | POA: Diagnosis not present

## 2015-01-02 DIAGNOSIS — R262 Difficulty in walking, not elsewhere classified: Secondary | ICD-10-CM | POA: Diagnosis not present

## 2015-01-10 DIAGNOSIS — M25562 Pain in left knee: Secondary | ICD-10-CM | POA: Diagnosis not present

## 2015-01-10 DIAGNOSIS — R262 Difficulty in walking, not elsewhere classified: Secondary | ICD-10-CM | POA: Diagnosis not present

## 2015-01-10 DIAGNOSIS — Z96652 Presence of left artificial knee joint: Secondary | ICD-10-CM | POA: Diagnosis not present

## 2015-01-10 DIAGNOSIS — M25662 Stiffness of left knee, not elsewhere classified: Secondary | ICD-10-CM | POA: Diagnosis not present

## 2015-01-19 ENCOUNTER — Other Ambulatory Visit: Payer: Self-pay | Admitting: Adult Health

## 2015-01-30 DIAGNOSIS — Z471 Aftercare following joint replacement surgery: Secondary | ICD-10-CM | POA: Diagnosis not present

## 2015-01-30 DIAGNOSIS — Z96659 Presence of unspecified artificial knee joint: Secondary | ICD-10-CM | POA: Diagnosis not present

## 2015-02-12 DIAGNOSIS — M25562 Pain in left knee: Secondary | ICD-10-CM | POA: Diagnosis not present

## 2015-02-14 DIAGNOSIS — M25562 Pain in left knee: Secondary | ICD-10-CM | POA: Diagnosis not present

## 2015-02-14 DIAGNOSIS — Z96652 Presence of left artificial knee joint: Secondary | ICD-10-CM | POA: Diagnosis not present

## 2015-02-14 DIAGNOSIS — M25462 Effusion, left knee: Secondary | ICD-10-CM | POA: Diagnosis not present

## 2015-02-14 DIAGNOSIS — R262 Difficulty in walking, not elsewhere classified: Secondary | ICD-10-CM | POA: Diagnosis not present

## 2015-02-14 DIAGNOSIS — M25662 Stiffness of left knee, not elsewhere classified: Secondary | ICD-10-CM | POA: Diagnosis not present

## 2015-02-16 ENCOUNTER — Encounter (HOSPITAL_COMMUNITY): Payer: Self-pay | Admitting: Emergency Medicine

## 2015-02-16 ENCOUNTER — Inpatient Hospital Stay (HOSPITAL_COMMUNITY)
Admission: EM | Admit: 2015-02-16 | Discharge: 2015-02-19 | DRG: 445 | Disposition: A | Discharge status: Other Acute Inpatient Hospital | Payer: Medicare Other | Attending: Internal Medicine | Admitting: Internal Medicine

## 2015-02-16 DIAGNOSIS — Z7901 Long term (current) use of anticoagulants: Secondary | ICD-10-CM

## 2015-02-16 DIAGNOSIS — E1122 Type 2 diabetes mellitus with diabetic chronic kidney disease: Secondary | ICD-10-CM | POA: Diagnosis present

## 2015-02-16 DIAGNOSIS — K838 Other specified diseases of biliary tract: Secondary | ICD-10-CM | POA: Diagnosis present

## 2015-02-16 DIAGNOSIS — Z9884 Bariatric surgery status: Secondary | ICD-10-CM | POA: Diagnosis not present

## 2015-02-16 DIAGNOSIS — E876 Hypokalemia: Secondary | ICD-10-CM | POA: Diagnosis present

## 2015-02-16 DIAGNOSIS — Z8249 Family history of ischemic heart disease and other diseases of the circulatory system: Secondary | ICD-10-CM

## 2015-02-16 DIAGNOSIS — G4733 Obstructive sleep apnea (adult) (pediatric): Secondary | ICD-10-CM | POA: Diagnosis present

## 2015-02-16 DIAGNOSIS — Z6834 Body mass index (BMI) 34.0-34.9, adult: Secondary | ICD-10-CM

## 2015-02-16 DIAGNOSIS — R1011 Right upper quadrant pain: Secondary | ICD-10-CM | POA: Insufficient documentation

## 2015-02-16 DIAGNOSIS — K8020 Calculus of gallbladder without cholecystitis without obstruction: Secondary | ICD-10-CM | POA: Insufficient documentation

## 2015-02-16 DIAGNOSIS — K8070 Calculus of gallbladder and bile duct without cholecystitis without obstruction: Secondary | ICD-10-CM | POA: Diagnosis not present

## 2015-02-16 DIAGNOSIS — F1010 Alcohol abuse, uncomplicated: Secondary | ICD-10-CM | POA: Diagnosis present

## 2015-02-16 DIAGNOSIS — F32A Depression, unspecified: Secondary | ICD-10-CM

## 2015-02-16 DIAGNOSIS — N179 Acute kidney failure, unspecified: Secondary | ICD-10-CM | POA: Diagnosis present

## 2015-02-16 DIAGNOSIS — R945 Abnormal results of liver function studies: Secondary | ICD-10-CM

## 2015-02-16 DIAGNOSIS — R109 Unspecified abdominal pain: Secondary | ICD-10-CM | POA: Diagnosis not present

## 2015-02-16 DIAGNOSIS — R799 Abnormal finding of blood chemistry, unspecified: Secondary | ICD-10-CM | POA: Diagnosis not present

## 2015-02-16 DIAGNOSIS — Z981 Arthrodesis status: Secondary | ICD-10-CM

## 2015-02-16 DIAGNOSIS — K760 Fatty (change of) liver, not elsewhere classified: Secondary | ICD-10-CM | POA: Diagnosis not present

## 2015-02-16 DIAGNOSIS — E669 Obesity, unspecified: Secondary | ICD-10-CM | POA: Diagnosis present

## 2015-02-16 DIAGNOSIS — E1129 Type 2 diabetes mellitus with other diabetic kidney complication: Secondary | ICD-10-CM

## 2015-02-16 DIAGNOSIS — Z885 Allergy status to narcotic agent status: Secondary | ICD-10-CM

## 2015-02-16 DIAGNOSIS — M1990 Unspecified osteoarthritis, unspecified site: Secondary | ICD-10-CM | POA: Diagnosis present

## 2015-02-16 DIAGNOSIS — Z833 Family history of diabetes mellitus: Secondary | ICD-10-CM

## 2015-02-16 DIAGNOSIS — R740 Nonspecific elevation of levels of transaminase and lactic acid dehydrogenase [LDH]: Secondary | ICD-10-CM | POA: Diagnosis not present

## 2015-02-16 DIAGNOSIS — Z794 Long term (current) use of insulin: Secondary | ICD-10-CM

## 2015-02-16 DIAGNOSIS — Z8611 Personal history of tuberculosis: Secondary | ICD-10-CM

## 2015-02-16 DIAGNOSIS — K831 Obstruction of bile duct: Secondary | ICD-10-CM

## 2015-02-16 DIAGNOSIS — Z79899 Other long term (current) drug therapy: Secondary | ICD-10-CM

## 2015-02-16 DIAGNOSIS — G4730 Sleep apnea, unspecified: Secondary | ICD-10-CM | POA: Diagnosis present

## 2015-02-16 DIAGNOSIS — R17 Unspecified jaundice: Secondary | ICD-10-CM

## 2015-02-16 DIAGNOSIS — R7989 Other specified abnormal findings of blood chemistry: Secondary | ICD-10-CM | POA: Diagnosis present

## 2015-02-16 DIAGNOSIS — I82513 Chronic embolism and thrombosis of femoral vein, bilateral: Secondary | ICD-10-CM | POA: Diagnosis present

## 2015-02-16 DIAGNOSIS — N182 Chronic kidney disease, stage 2 (mild): Secondary | ICD-10-CM | POA: Diagnosis present

## 2015-02-16 DIAGNOSIS — Z96652 Presence of left artificial knee joint: Secondary | ICD-10-CM | POA: Diagnosis present

## 2015-02-16 DIAGNOSIS — R7401 Elevation of levels of liver transaminase levels: Secondary | ICD-10-CM

## 2015-02-16 DIAGNOSIS — E119 Type 2 diabetes mellitus without complications: Secondary | ICD-10-CM

## 2015-02-16 DIAGNOSIS — E785 Hyperlipidemia, unspecified: Secondary | ICD-10-CM | POA: Diagnosis present

## 2015-02-16 DIAGNOSIS — I10 Essential (primary) hypertension: Secondary | ICD-10-CM

## 2015-02-16 DIAGNOSIS — I129 Hypertensive chronic kidney disease with stage 1 through stage 4 chronic kidney disease, or unspecified chronic kidney disease: Secondary | ICD-10-CM | POA: Diagnosis present

## 2015-02-16 DIAGNOSIS — F329 Major depressive disorder, single episode, unspecified: Secondary | ICD-10-CM | POA: Diagnosis present

## 2015-02-16 HISTORY — DX: Hyperlipidemia, unspecified: E78.5

## 2015-02-16 HISTORY — DX: Alcohol abuse, uncomplicated: F10.10

## 2015-02-16 HISTORY — DX: Obstructive sleep apnea (adult) (pediatric): G47.33

## 2015-02-16 LAB — CBC WITH DIFFERENTIAL/PLATELET
BASOS PCT: 0 % (ref 0–1)
Basophils Absolute: 0 10*3/uL (ref 0.0–0.1)
EOS ABS: 0 10*3/uL (ref 0.0–0.7)
Eosinophils Relative: 0 % (ref 0–5)
HCT: 34.8 % — ABNORMAL LOW (ref 39.0–52.0)
Hemoglobin: 11.4 g/dL — ABNORMAL LOW (ref 13.0–17.0)
LYMPHS ABS: 1.5 10*3/uL (ref 0.7–4.0)
Lymphocytes Relative: 15 % (ref 12–46)
MCH: 25.7 pg — ABNORMAL LOW (ref 26.0–34.0)
MCHC: 32.8 g/dL (ref 30.0–36.0)
MCV: 78.6 fL (ref 78.0–100.0)
MONOS PCT: 4 % (ref 3–12)
Monocytes Absolute: 0.4 10*3/uL (ref 0.1–1.0)
NEUTROS ABS: 7.9 10*3/uL — AB (ref 1.7–7.7)
NEUTROS PCT: 81 % — AB (ref 43–77)
PLATELETS: 576 10*3/uL — AB (ref 150–400)
RBC: 4.43 MIL/uL (ref 4.22–5.81)
RDW: 15.2 % (ref 11.5–15.5)
WBC: 9.9 10*3/uL (ref 4.0–10.5)

## 2015-02-16 LAB — COMPREHENSIVE METABOLIC PANEL
ALK PHOS: 303 U/L — AB (ref 38–126)
ALT: 196 U/L — ABNORMAL HIGH (ref 17–63)
AST: 435 U/L — ABNORMAL HIGH (ref 15–41)
Albumin: 3.3 g/dL — ABNORMAL LOW (ref 3.5–5.0)
Anion gap: 13 (ref 5–15)
BILIRUBIN TOTAL: 1.7 mg/dL — AB (ref 0.3–1.2)
BUN: 21 mg/dL — ABNORMAL HIGH (ref 6–20)
CO2: 23 mmol/L (ref 22–32)
Calcium: 8.9 mg/dL (ref 8.9–10.3)
Chloride: 99 mmol/L — ABNORMAL LOW (ref 101–111)
Creatinine, Ser: 1.34 mg/dL — ABNORMAL HIGH (ref 0.61–1.24)
GFR calc Af Amer: >60 mL/min (ref >60)
GFR, EST NON AFRICAN AMERICAN: 58 mL/min — AB (ref >60)
Glucose, Bld: 120 mg/dL — ABNORMAL HIGH (ref 65–99)
POTASSIUM: 3.1 mmol/L — AB (ref 3.5–5.1)
SODIUM: 135 mmol/L (ref 135–145)
TOTAL PROTEIN: 7.5 g/dL (ref 6.5–8.1)

## 2015-02-16 LAB — LIPASE, BLOOD: LIPASE: 38 U/L (ref 22–51)

## 2015-02-16 LAB — URINALYSIS, ROUTINE W REFLEX MICROSCOPIC
Bilirubin Urine: NEGATIVE
Glucose, UA: NEGATIVE mg/dL
Hgb urine dipstick: NEGATIVE
Ketones, ur: NEGATIVE mg/dL
Leukocytes, UA: NEGATIVE
NITRITE: NEGATIVE
PH: 5.5 (ref 5.0–8.0)
Protein, ur: 30 mg/dL — AB
SPECIFIC GRAVITY, URINE: 1.015 (ref 1.005–1.030)
Urobilinogen, UA: 1 mg/dL (ref 0.0–1.0)

## 2015-02-16 LAB — URINE MICROSCOPIC-ADD ON

## 2015-02-16 MED ORDER — FENTANYL CITRATE (PF) 100 MCG/2ML IJ SOLN
50.0000 ug | Freq: Once | INTRAMUSCULAR | Status: AC
Start: 2015-02-16 — End: 2015-02-16
  Administered 2015-02-16: 50 ug via INTRAVENOUS
  Filled 2015-02-16: qty 2

## 2015-02-16 MED ORDER — ONDANSETRON HCL 4 MG/2ML IJ SOLN
4.0000 mg | Freq: Once | INTRAMUSCULAR | Status: AC
  Administered 2015-02-16: 4 mg via INTRAVENOUS
  Filled 2015-02-16: qty 2

## 2015-02-16 MED ORDER — SODIUM CHLORIDE 0.9 % IV BOLUS (SEPSIS)
1000.0000 mL | Freq: Once | INTRAVENOUS | Status: AC
  Administered 2015-02-16: 1000 mL via INTRAVENOUS

## 2015-02-16 MED ORDER — IOHEXOL 300 MG/ML  SOLN: 25.0000 mL | Freq: Once | INTRAMUSCULAR | Status: AC | PRN

## 2015-02-16 MED ORDER — MORPHINE SULFATE 4 MG/ML IJ SOLN
4.0000 mg | Freq: Once | INTRAMUSCULAR | Status: AC
  Administered 2015-02-16: 4 mg via INTRAVENOUS
  Filled 2015-02-16: qty 1

## 2015-02-16 MED ORDER — FENTANYL CITRATE (PF) 100 MCG/2ML IJ SOLN
50.0000 ug | Freq: Once | INTRAMUSCULAR | Status: AC
  Administered 2015-02-16: 50 ug via NASAL

## 2015-02-16 MED ORDER — PROMETHAZINE HCL 25 MG/ML IJ SOLN
12.5000 mg | Freq: Once | INTRAMUSCULAR | Status: AC
  Administered 2015-02-17: 12.5 mg via INTRAVENOUS
  Filled 2015-02-16: qty 1

## 2015-02-16 NOTE — ED Notes (Addendum)
Pt reports he has had gastric bypass and occasionally gets food stuck but today this is worse. sts he ate this morning and since then he has been dealing with the pain. Pt c.o abdominal pain, nausea and dry heaving.

## 2015-02-16 NOTE — ED Provider Notes (Signed)
CSN: [id number]     Arrival date & time 02/16/15  2124 History   First MD Initiated Contact with Patient 02/16/15 2221     This chart was scribed for Marisa Severinlga Bluma Buresh, MD by Arlan OrganAshley Leger, ED Scribe. This patient was seen in room A10C/A10C and the patient's care was started 11:14 PM.   Chief Complaint  Patient presents with  . Abdominal Pain   The history is provided by the patient. No language interpreter was used.    HPI Comments: Angel Phillips is a 54 y.o. male with a PMHx of HTN and DM who presents to the Emergency Department complaining of constant, ongoing, progressively worsening abdominal pain onset this morning. Pt states he ate a bagel earlier today and states he feels a piece is stuck in his GI track. He has attempted to drink fluids to move the object without any success. However, pt was able to tolerate fluids without difficult. Last normal bowel movement earlier today. Angel Phillips also reports chills and mild weakness. No recent fever, vomiting, or diarrhea. PSHx includes Roux-en-Y gastric [facility]; no previous dilation and total knee arthroplasty. No history of small bowel obstruction. Pt followed with PCP 1 week ago and had blood work- no abnormal findings. He is not a drinker or smoker. No illicit drug use.  Patient's history reports past history of alcohol abuse, however.  Past Medical History  Diagnosis Date  . Hypertension   . Diabetes mellitus without complication   . Arthritis   . Tuberculosis     tb positive 1989 ?   Marland Kitchen. Depression   . PONV (postoperative nausea and vomiting)   . Chronic kidney disease 02/2013    ACUTE RENAL FAILURE   Past Surgical History  Procedure Laterality Date  . Knee arthroscopy    . Gastric bypass    . Spinal fusion      Harrington Rod  . 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 History  Substance Use Topics  . Smoking status: Never Smoker   . Smokeless tobacco: Never Used  . Alcohol Use: No     Comment: no longer drinks alcohol"    Review of Systems  Constitutional: Positive for chills. Negative for fever.  Respiratory: Negative for cough and shortness of breath.   Gastrointestinal: Positive for abdominal pain. Negative for nausea and vomiting.  Skin: Negative for rash.  Neurological: Positive for weakness.  Psychiatric/Behavioral: Negative for confusion.      Allergies  Oxycodone  Home Medications   Prior to Admission medications   Medication Sig Start Date End Date Taking? Authorizing Provider  amLODipine (NORVASC) 10 MG tablet Take 1 tablet (10 mg total) by mouth daily. For high blood pressure control 06/15/13  Yes Sanjuana KavaAgnes I Nwoko, NP  chlorthalidone (HYGROTON) 25 MG tablet Take 1 tablet (25 mg total) by mouth daily. For control of HTN/diabetes mellitus Patient taking differently: Take 25 mg by mouth daily with breakfast. For control of HTN/diabetes mellitus 06/15/13  Yes Sanjuana KavaAgnes I Nwoko, NP  Cyanocobalamin (VITAMIN B-12 IJ) Inject as directed every 30 (thirty) days.   Yes Historical Provider, MD  gabapentin (NEURONTIN) 300 MG capsule Take 2 capsules (600 mg total) by mouth 2 (two) times daily. For pain/anxiety 06/15/13  Yes Sanjuana KavaAgnes I Nwoko, NP  glipiZIDE (GLUCOTROL) 10 MG tablet Take 5 mg by mouth  2 (two) times daily before a meal.    Yes Historical Provider, MD  insulin glargine (LANTUS) 100 UNIT/ML injection Inject 10 Units into the skin daily.   Yes Historical Provider, MD  lidocaine (LIDODERM) 5 % Place 1 patch onto the skin daily. Remove & Discard patch within 12 hours or as directed by MD: For pain management 06/15/13  Yes Sanjuana Kava, NP  lisinopril (PRINIVIL,ZESTRIL) 20 MG tablet Take 0.5 tablets (10 mg total) by mouth daily. For high blood pressure control Patient taking differently: Take 20 mg by mouth daily. For high blood  pressure control 06/15/13  Yes Sanjuana Kava, NP  metFORMIN (GLUCOPHAGE) 500 MG tablet Take 1,000 mg by mouth 2 (two) times daily with a meal.    Yes Historical Provider, MD  simvastatin (ZOCOR) 40 MG tablet Take 20 mg by mouth at bedtime.   Yes Historical Provider, MD  cyclobenzaprine (FLEXERIL) 5 MG tablet Take 1 tablet (5 mg total) by mouth 3 (three) times daily as needed for muscle spasms. For muscle spasm Patient not taking: Reported on 02/16/2015 12/11/14   Allena Katz, PA-C  HYDROmorphone (DILAUDID) 2 MG tablet Take two tablets by mouth every 6 hours as needed for severe pain; Take one tablet by mouth every 6 hours as needed for less severe pain Patient not taking: Reported on 02/16/2015 12/19/14   Oneal Grout, MD  XARELTO STARTER PACK 15 & 20 MG TBPK Take 15-20 mg by mouth as directed. Take as directed on package: Start with one  tablet by mouth twice a day with food. On Day 22, switch to one  tablet once a day with food. Patient not taking: Reported on 02/16/2015 12/15/14   Francee Piccolo, PA-C   Triage Vitals: BP 158/77 mmHg  Pulse 82  Temp(Src) 98.2 F (36.8 C) (Oral)  Resp 20  SpO2 100%   Physical Exam  Constitutional: He is oriented to person, place, and time. He appears well-developed and well-nourished.  HENT:  Head: Normocephalic and atraumatic.  Nose: Nose normal.  Mouth/Throat: Oropharynx is clear and moist.  Eyes: Conjunctivae and EOM are normal. Pupils are equal, round, and reactive to light.  Neck: Normal range of motion. Neck supple. No JVD present. No tracheal deviation present. No thyromegaly present.  Cardiovascular: Normal rate, regular rhythm, normal heart sounds and intact distal pulses.  Exam reveals no gallop and no friction rub.   No murmur heard. Pulmonary/Chest: Effort normal and breath sounds normal. No stridor. No respiratory distress. He has no wheezes. He has no rales. He exhibits no tenderness.  Abdominal: Soft. He exhibits no distension  and no mass. There is tenderness. There is no rebound and no guarding.  Hyperactive bowel sounds noted.  Patient has tenderness periumbilical and lower.  Minimal epigastric and right upper quadrant tenderness.  Negative Murphy sign.  Musculoskeletal: Normal range of motion. He exhibits no edema or tenderness.  Lymphadenopathy:    He has no cervical adenopathy.  Neurological: He is alert and oriented to person, place, and time. He displays normal reflexes. He exhibits normal muscle tone. Coordination normal.  Skin: Skin is warm and dry. No rash noted. No erythema. No pallor.  Psychiatric: He has a normal mood and affect. His behavior is normal. Judgment and thought content normal.  Nursing note and vitals reviewed.   ED Course  Procedures (including critical care time)  DIAGNOSTIC STUDIES: Oxygen Saturation is 100% on RA, Normal by my interpretation.    COORDINATION OF CARE: 11:25 PM- Will  order CT abdomen pelvis with contrast, CBC, CMP, Lipase, urinalysis. Will give sublimaze and zofran. Discussed treatment plan with pt at bedside and pt agreed to plan.     Labs Review Labs Reviewed  CBC WITH DIFFERENTIAL/PLATELET - Abnormal; Notable for the following:    Hemoglobin 11.4 (*)    HCT 34.8 (*)    MCH 25.7 (*)    Platelets 576 (*)    Neutrophils Relative % 81 (*)    Neutro Abs 7.9 (*)    All other components within normal limits  COMPREHENSIVE METABOLIC PANEL - Abnormal; Notable for the following:    Potassium 3.1 (*)    Chloride 99 (*)    Glucose, Bld 120 (*)    BUN 21 (*)    Creatinine, Ser 1.34 (*)    Albumin 3.3 (*)    AST 435 (*)    ALT 196 (*)    Alkaline Phosphatase 303 (*)    Total Bilirubin 1.7 (*)    GFR calc non Af Amer 58 (*)    All other components within normal limits  URINALYSIS, ROUTINE W REFLEX MICROSCOPIC - Abnormal; Notable for the following:    Protein, ur 30 (*)    All other components within normal limits  URINE MICROSCOPIC-ADD ON - Abnormal; Notable  for the following:    Casts HYALINE CASTS (*)    All other components within normal limits  LIPASE, BLOOD  ETHANOL  HEPATITIS PANEL, ACUTE    Imaging Review Ct Abdomen Pelvis W Contrast  02/17/2015   CLINICAL DATA:  Gastric bypass. Abdominal pain with nausea and vomiting after eating a bagel.  EXAM: CT ABDOMEN AND PELVIS WITH CONTRAST  TECHNIQUE: Multidetector CT imaging of the abdomen and pelvis was performed using the standard protocol following bolus administration of intravenous contrast.  CONTRAST:  OMNIPAQUE IOHEXOL 300 MG/ML  SOLN  COMPARISON:  None.  FINDINGS: BODY WALL: No contributory findings.  LOWER CHEST: Minimal retained oral contrast in lower esophagus, but no indication of obstructive food bolus. Probable tiny hiatal hernia.  ABDOMEN/PELVIS:  Liver: No focal abnormality.  Biliary: Mild intrahepatic biliary duct dilation. There is no focal narrowing of the biliary tree. Large calcified gallstone, without inflammatory change. A smoothly contoured ovoid nodule along the anterior/upper wall of the gallbladder is likely a small lobule of liver given isoenhancement and discrete inner gallbladder wall.  Pancreas: Unremarkable.  Spleen: Unremarkable.  Adrenals: 1 cm nodule in the right adrenal gland is presumably a adenoma, although not specific on postcontrast only imaging.  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 There is symmetric mild fullness of the upper collecting system. No ureteral obstruction or stone identified. Small bilateral presumed cysts. There is a simple 17 mm cyst exophytic from the left lower pole  Bladder: Unremarkable.  Reproductive: Penile pump with reservoir on the right.  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 Normal appendix. Distal colonic diverticulosis.  Retroperitoneum: No mass or adenopathy.  Peritoneum: No ascites or pneumoperitoneum.   Vascular: Linear calcification in the left common femoral vein likely from remote venous access or remote thrombosis.  OSSEOUS: Posterior spinal fixation from T10-L3 with complete posterior element bony fusion. 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 Canal stenosis also present at L3-4, mainly from posterior element overgrowth and hardware. There is advanced adjacent level disc and facet degeneration with advanced bilateral foraminal stenosis at L3-4 and L4-5.  IMPRESSION: 1. Cholelithiasis without evidence of acute cholecystitis. 2. Mild intrahepatic biliary dilatation without visible cause. Correlate with liver function tests. 3. Gastric bypass without complication. 4. Incidental findings noted above.   Electronically Signed   By: Marnee SpringJonathon  Watts M.D.   On: 02/17/2015 01:01   [place] Abdomen Limited  02/17/2015   CLINICAL DATA:  Abdominal pain, elevated LFTs, large gallstone.  EXAM: US ABDOMEN LIMITED - RIGHT UPPER QUADRANT  COMPARISON:  CT earlier this day.  FINDINGS: Gallbladder:  Physiologically distended and contains a shadowing 2.4 cm gallstone. No gallbladder wall thickening, wall thickness of 2 mm. Sonographic Eulah PontMurphy sign is negative.  Common bile duct:  Diameter: 10 mm at the porta hepatis. Distal common bile duct obscured.  Liver:  No focal lesion identified. Diffusely increased and heterogeneous in parenchymal echogenicity.  IMPRESSION: 1. Cholelithiasis without secondary findings of acute cholecystitis. 2. Biliary dilatation, common bile duct of 10 mm. Cause of biliary dilatation is not delineated, MRCP could be considered for further evaluation if patient is able tolerate breath hold technique. 3. Hepatic steatosis.   Electronically Signed   By: Rubye OaksMelanie  Ehinger M.D.   On: 02/17/2015 02:29     EKG Interpretation None      MDM   Final diagnoses:  Right upper quadrant pain  Transaminitis  Elevated bilirubin  Cholelithiasis without  cholecystitis    54 year old male with abdominal pain starting this morning after eating a bagel.  He shouldn't seems to attribute it to a "being stuck, however, he has been able to manage liquids.  Abdominal pain is periumbilical and lower.  Elevated LFTs noted, no significant pain and right upper quadrant.  No Murphy sign.  I personally performed the services described in this documentation, which was scribed in my presence. The recorded information has been reviewed and is accurate.    11:34 PM Case discussed with Dr. Rande Bruntivera's on call for general surgery at this time.  He recommends CT abdomen pelvis .  2.  Initiate workup of abdominal pain with gastric bypass.  Patient updated on findings and plan.  3:09 AM CT scan without signs of obstruction, no food bolus impaction.  Patient does have large gallstone, but no signs of cholecystitis on CT scan.  Patient with return of abdominal pain, now more right upper quadrant.  Plan for ultrasound for further evaluation.  Ultrasound without signs of pericholecystic fluid or thickened gallbladder wall.  He does have a dilated biliary ducts, which may harbor of unseen stone.  This would go along with elevated LFTs.  As pain has been persistent despite multiple rounds of pain medication.  Will discuss with hospitalist for admission for pain control and GI consult for possible ERCP for stone extraction.  Marisa Severinlga Briyanna Billingham, MD 02/17/15 [phone]

## 2015-02-16 NOTE — ED Notes (Signed)
Dr. Otter at the bedside.  

## 2015-02-17 ENCOUNTER — Emergency Department (HOSPITAL_COMMUNITY): Payer: Medicare Other

## 2015-02-17 ENCOUNTER — Inpatient Hospital Stay (HOSPITAL_COMMUNITY): Payer: Medicare Other

## 2015-02-17 ENCOUNTER — Encounter (HOSPITAL_COMMUNITY): Payer: Self-pay | Admitting: Radiology

## 2015-02-17 DIAGNOSIS — R1010 Upper abdominal pain, unspecified: Secondary | ICD-10-CM

## 2015-02-17 DIAGNOSIS — Z7901 Long term (current) use of anticoagulants: Secondary | ICD-10-CM | POA: Diagnosis not present

## 2015-02-17 DIAGNOSIS — R109 Unspecified abdominal pain: Secondary | ICD-10-CM | POA: Diagnosis present

## 2015-02-17 DIAGNOSIS — Z8249 Family history of ischemic heart disease and other diseases of the circulatory system: Secondary | ICD-10-CM | POA: Diagnosis not present

## 2015-02-17 DIAGNOSIS — M7989 Other specified soft tissue disorders: Secondary | ICD-10-CM

## 2015-02-17 DIAGNOSIS — K8040 Calculus of bile duct with cholecystitis, unspecified, without obstruction: Secondary | ICD-10-CM | POA: Diagnosis not present

## 2015-02-17 DIAGNOSIS — Z885 Allergy status to narcotic agent status: Secondary | ICD-10-CM | POA: Diagnosis not present

## 2015-02-17 DIAGNOSIS — R799 Abnormal finding of blood chemistry, unspecified: Secondary | ICD-10-CM | POA: Diagnosis not present

## 2015-02-17 DIAGNOSIS — E1122 Type 2 diabetes mellitus with diabetic chronic kidney disease: Secondary | ICD-10-CM | POA: Diagnosis not present

## 2015-02-17 DIAGNOSIS — N182 Chronic kidney disease, stage 2 (mild): Secondary | ICD-10-CM | POA: Diagnosis not present

## 2015-02-17 DIAGNOSIS — Z9884 Bariatric surgery status: Secondary | ICD-10-CM | POA: Diagnosis not present

## 2015-02-17 DIAGNOSIS — R945 Abnormal results of liver function studies: Secondary | ICD-10-CM

## 2015-02-17 DIAGNOSIS — E119 Type 2 diabetes mellitus without complications: Secondary | ICD-10-CM | POA: Diagnosis not present

## 2015-02-17 DIAGNOSIS — F329 Major depressive disorder, single episode, unspecified: Secondary | ICD-10-CM | POA: Insufficient documentation

## 2015-02-17 DIAGNOSIS — Z981 Arthrodesis status: Secondary | ICD-10-CM | POA: Diagnosis not present

## 2015-02-17 DIAGNOSIS — K830 Cholangitis: Secondary | ICD-10-CM | POA: Diagnosis not present

## 2015-02-17 DIAGNOSIS — M1990 Unspecified osteoarthritis, unspecified site: Secondary | ICD-10-CM | POA: Diagnosis not present

## 2015-02-17 DIAGNOSIS — G4733 Obstructive sleep apnea (adult) (pediatric): Secondary | ICD-10-CM | POA: Diagnosis not present

## 2015-02-17 DIAGNOSIS — F1010 Alcohol abuse, uncomplicated: Secondary | ICD-10-CM | POA: Diagnosis not present

## 2015-02-17 DIAGNOSIS — K8033 Calculus of bile duct with acute cholangitis with obstruction: Secondary | ICD-10-CM | POA: Diagnosis not present

## 2015-02-17 DIAGNOSIS — R1011 Right upper quadrant pain: Secondary | ICD-10-CM | POA: Diagnosis not present

## 2015-02-17 DIAGNOSIS — Z96652 Presence of left artificial knee joint: Secondary | ICD-10-CM | POA: Diagnosis not present

## 2015-02-17 DIAGNOSIS — N179 Acute kidney failure, unspecified: Secondary | ICD-10-CM | POA: Diagnosis not present

## 2015-02-17 DIAGNOSIS — E876 Hypokalemia: Secondary | ICD-10-CM | POA: Diagnosis present

## 2015-02-17 DIAGNOSIS — R7989 Other specified abnormal findings of blood chemistry: Secondary | ICD-10-CM

## 2015-02-17 DIAGNOSIS — Z6834 Body mass index (BMI) 34.0-34.9, adult: Secondary | ICD-10-CM | POA: Diagnosis not present

## 2015-02-17 DIAGNOSIS — F32A Depression, unspecified: Secondary | ICD-10-CM | POA: Insufficient documentation

## 2015-02-17 DIAGNOSIS — E1129 Type 2 diabetes mellitus with other diabetic kidney complication: Secondary | ICD-10-CM

## 2015-02-17 DIAGNOSIS — Z794 Long term (current) use of insulin: Secondary | ICD-10-CM | POA: Diagnosis not present

## 2015-02-17 DIAGNOSIS — I10 Essential (primary) hypertension: Secondary | ICD-10-CM | POA: Insufficient documentation

## 2015-02-17 DIAGNOSIS — R935 Abnormal findings on diagnostic imaging of other abdominal regions, including retroperitoneum: Secondary | ICD-10-CM | POA: Diagnosis not present

## 2015-02-17 DIAGNOSIS — I82513 Chronic embolism and thrombosis of femoral vein, bilateral: Secondary | ICD-10-CM | POA: Diagnosis not present

## 2015-02-17 DIAGNOSIS — G4730 Sleep apnea, unspecified: Secondary | ICD-10-CM

## 2015-02-17 DIAGNOSIS — R112 Nausea with vomiting, unspecified: Secondary | ICD-10-CM | POA: Diagnosis not present

## 2015-02-17 DIAGNOSIS — Z79899 Other long term (current) drug therapy: Secondary | ICD-10-CM | POA: Diagnosis not present

## 2015-02-17 DIAGNOSIS — K8050 Calculus of bile duct without cholangitis or cholecystitis without obstruction: Secondary | ICD-10-CM | POA: Diagnosis not present

## 2015-02-17 DIAGNOSIS — K760 Fatty (change of) liver, not elsewhere classified: Secondary | ICD-10-CM | POA: Diagnosis not present

## 2015-02-17 DIAGNOSIS — K8020 Calculus of gallbladder without cholecystitis without obstruction: Secondary | ICD-10-CM | POA: Diagnosis not present

## 2015-02-17 DIAGNOSIS — Z833 Family history of diabetes mellitus: Secondary | ICD-10-CM | POA: Diagnosis not present

## 2015-02-17 DIAGNOSIS — K838 Other specified diseases of biliary tract: Secondary | ICD-10-CM | POA: Diagnosis not present

## 2015-02-17 DIAGNOSIS — K831 Obstruction of bile duct: Secondary | ICD-10-CM | POA: Diagnosis not present

## 2015-02-17 DIAGNOSIS — Z8611 Personal history of tuberculosis: Secondary | ICD-10-CM | POA: Diagnosis not present

## 2015-02-17 DIAGNOSIS — E785 Hyperlipidemia, unspecified: Secondary | ICD-10-CM | POA: Diagnosis not present

## 2015-02-17 DIAGNOSIS — E669 Obesity, unspecified: Secondary | ICD-10-CM | POA: Diagnosis not present

## 2015-02-17 DIAGNOSIS — K8070 Calculus of gallbladder and bile duct without cholecystitis without obstruction: Secondary | ICD-10-CM | POA: Diagnosis not present

## 2015-02-17 DIAGNOSIS — I129 Hypertensive chronic kidney disease with stage 1 through stage 4 chronic kidney disease, or unspecified chronic kidney disease: Secondary | ICD-10-CM | POA: Diagnosis not present

## 2015-02-17 DIAGNOSIS — R740 Nonspecific elevation of levels of transaminase and lactic acid dehydrogenase [LDH]: Secondary | ICD-10-CM | POA: Diagnosis not present

## 2015-02-17 LAB — CBC
HEMATOCRIT: 29.8 % — AB (ref 39.0–52.0)
Hemoglobin: 10 g/dL — ABNORMAL LOW (ref 13.0–17.0)
MCH: 26.4 pg (ref 26.0–34.0)
MCHC: 33.6 g/dL (ref 30.0–36.0)
MCV: 78.6 fL (ref 78.0–100.0)
PLATELETS: 478 10*3/uL — AB (ref 150–400)
RBC: 3.79 MIL/uL — ABNORMAL LOW (ref 4.22–5.81)
RDW: 15.4 % (ref 11.5–15.5)
WBC: 12 10*3/uL — AB (ref 4.0–10.5)

## 2015-02-17 LAB — COMPREHENSIVE METABOLIC PANEL
ALBUMIN: 2.6 g/dL — AB (ref 3.5–5.0)
ALK PHOS: 273 U/L — AB (ref 38–126)
ALT: 257 U/L — AB (ref 17–63)
AST: 429 U/L — AB (ref 15–41)
Anion gap: 9 (ref 5–15)
BUN: 20 mg/dL (ref 6–20)
CALCIUM: 8.2 mg/dL — AB (ref 8.9–10.3)
CO2: 25 mmol/L (ref 22–32)
CREATININE: 1.41 mg/dL — AB (ref 0.61–1.24)
Chloride: 102 mmol/L (ref 101–111)
GFR calc non Af Amer: 55 mL/min — ABNORMAL LOW (ref >60)
Glucose, Bld: 122 mg/dL — ABNORMAL HIGH (ref 65–99)
Potassium: 3.4 mmol/L — ABNORMAL LOW (ref 3.5–5.1)
Sodium: 136 mmol/L (ref 135–145)
Total Bilirubin: 2.9 mg/dL — ABNORMAL HIGH (ref 0.3–1.2)
Total Protein: 6.1 g/dL — ABNORMAL LOW (ref 6.5–8.1)

## 2015-02-17 LAB — LIPID PANEL
CHOL/HDL RATIO: 3.4 ratio
Cholesterol: 174 mg/dL (ref 0–200)
HDL: 51 mg/dL (ref >40)
LDL Cholesterol: 109 mg/dL — ABNORMAL HIGH (ref 0–99)
Triglycerides: 69 mg/dL (ref <150)
VLDL: 14 mg/dL (ref 0–40)

## 2015-02-17 LAB — LACTIC ACID, PLASMA: LACTIC ACID, VENOUS: 1 mmol/L (ref 0.5–2.0)

## 2015-02-17 LAB — GLUCOSE, CAPILLARY
GLUCOSE-CAPILLARY: 159 mg/dL — AB (ref 65–99)
Glucose-Capillary: 129 mg/dL — ABNORMAL HIGH (ref 65–99)
Glucose-Capillary: 96 mg/dL (ref 65–99)
Glucose-Capillary: 95 mg/dL (ref 65–99)
Glucose-Capillary: 122 mg/dL — ABNORMAL HIGH (ref 65–99)

## 2015-02-17 LAB — TYPE AND SCREEN
ABO/RH(D): A POS
ANTIBODY SCREEN: NEGATIVE

## 2015-02-17 LAB — HEPATITIS PANEL, ACUTE
HCV AB: NEGATIVE
Hep A IgM: NONREACTIVE
Hep B C IgM: NONREACTIVE
Hepatitis B Surface Ag: NEGATIVE

## 2015-02-17 LAB — ETHANOL: Alcohol, Ethyl (B): <5 mg/dL (ref <5)

## 2015-02-17 LAB — APTT: aPTT: 35 seconds (ref 24–37)

## 2015-02-17 LAB — MAGNESIUM: MAGNESIUM: 1.4 mg/dL — AB (ref 1.7–2.4)

## 2015-02-17 LAB — PROTIME-INR
INR: 1.15 (ref 0.00–1.49)
PROTHROMBIN TIME: 14.9 s (ref 11.6–15.2)

## 2015-02-17 MED ORDER — MAGNESIUM SULFATE IN D5W 10-5 MG/ML-% IV SOLN
1.0000 g | Freq: Once | INTRAVENOUS | Status: AC
  Administered 2015-02-17: 1 g via INTRAVENOUS
  Filled 2015-02-17: qty 100

## 2015-02-17 MED ORDER — LIDOCAINE 5 % EX PTCH
1.0000 | MEDICATED_PATCH | CUTANEOUS | Status: DC
  Administered 2015-02-19: 1 via TRANSDERMAL
  Filled 2015-02-17 (×3): qty 1

## 2015-02-17 MED ORDER — MORPHINE SULFATE 4 MG/ML IJ SOLN
INTRAMUSCULAR | Status: AC
  Administered 2015-02-17: 4 mg
  Filled 2015-02-17: qty 1

## 2015-02-17 MED ORDER — ONDANSETRON HCL 4 MG/2ML IJ SOLN
4.0000 mg | Freq: Three times a day (TID) | INTRAMUSCULAR | Status: DC | PRN
  Administered 2015-02-17: 4 mg via INTRAVENOUS
  Filled 2015-02-17: qty 2

## 2015-02-17 MED ORDER — INSULIN ASPART 100 UNIT/ML ~~LOC~~ SOLN
0.0000 [IU] | Freq: Three times a day (TID) | SUBCUTANEOUS | Status: DC
  Administered 2015-02-18: 2 [IU] via SUBCUTANEOUS
  Administered 2015-02-18: 3 [IU] via SUBCUTANEOUS
  Administered 2015-02-19: 1 [IU] via SUBCUTANEOUS

## 2015-02-17 MED ORDER — SIMVASTATIN 20 MG PO TABS
20.0000 mg | ORAL_TABLET | Freq: Every day | ORAL | Status: DC
  Filled 2015-02-17: qty 1

## 2015-02-17 MED ORDER — MORPHINE SULFATE 2 MG/ML IJ SOLN
1.0000 mg | Freq: Once | INTRAMUSCULAR | Status: AC
  Administered 2015-02-17: 1 mg via INTRAVENOUS

## 2015-02-17 MED ORDER — MORPHINE SULFATE 2 MG/ML IJ SOLN
INTRAMUSCULAR | Status: AC
  Administered 2015-02-17: 1 mg via INTRAVENOUS
  Filled 2015-02-17: qty 1

## 2015-02-17 MED ORDER — IOHEXOL 300 MG/ML  SOLN
90.0000 mL | Freq: Once | INTRAMUSCULAR | Status: AC | PRN
  Administered 2015-02-17: 100 mL via INTRAVENOUS

## 2015-02-17 MED ORDER — SODIUM CHLORIDE 0.9 % IV SOLN
INTRAVENOUS | Status: DC
  Administered 2015-02-17 – 2015-02-18 (×3): via INTRAVENOUS

## 2015-02-17 MED ORDER — ALBUTEROL SULFATE (2.5 MG/3ML) 0.083% IN NEBU: 2.5000 mg | INHALATION_SOLUTION | Freq: Four times a day (QID) | RESPIRATORY_TRACT | Status: DC | PRN

## 2015-02-17 MED ORDER — INSULIN GLARGINE 100 UNIT/ML ~~LOC~~ SOLN
7.0000 [IU] | Freq: Every day | SUBCUTANEOUS | Status: DC
  Administered 2015-02-18 – 2015-02-19 (×2): 7 [IU] via SUBCUTANEOUS
  Filled 2015-02-17 (×3): qty 0.07

## 2015-02-17 MED ORDER — CIPROFLOXACIN IN D5W 400 MG/200ML IV SOLN
400.0000 mg | Freq: Two times a day (BID) | INTRAVENOUS | Status: DC
  Administered 2015-02-17 – 2015-02-18 (×2): 400 mg via INTRAVENOUS
  Filled 2015-02-17 (×3): qty 200

## 2015-02-17 MED ORDER — HYDRALAZINE HCL 20 MG/ML IJ SOLN: 5.0000 mg | INTRAMUSCULAR | Status: DC | PRN

## 2015-02-17 MED ORDER — MORPHINE SULFATE 2 MG/ML IJ SOLN
2.0000 mg | INTRAMUSCULAR | Status: DC | PRN
  Administered 2015-02-18 (×3): 4 mg via INTRAVENOUS
  Administered 2015-02-18: 2 mg via INTRAVENOUS
  Administered 2015-02-18 – 2015-02-19 (×6): 4 mg via INTRAVENOUS
  Administered 2015-02-19: 2 mg via INTRAVENOUS
  Administered 2015-02-19 (×2): 4 mg via INTRAVENOUS
  Filled 2015-02-17 (×4): qty 2
  Filled 2015-02-17: qty 1
  Filled 2015-02-17 (×5): qty 2
  Filled 2015-02-17: qty 1
  Filled 2015-02-17: qty 2
  Filled 2015-02-17: qty 1
  Filled 2015-02-17 (×2): qty 2

## 2015-02-17 MED ORDER — POTASSIUM CHLORIDE CRYS ER 20 MEQ PO TBCR: 40.0000 meq | EXTENDED_RELEASE_TABLET | Freq: Once | ORAL | Status: DC

## 2015-02-17 MED ORDER — HEPARIN SODIUM (PORCINE) 5000 UNIT/ML IJ SOLN
5000.0000 [IU] | Freq: Three times a day (TID) | INTRAMUSCULAR | Status: DC
  Administered 2015-02-17 – 2015-02-19 (×8): 5000 [IU] via SUBCUTANEOUS
  Filled 2015-02-17 (×9): qty 1

## 2015-02-17 MED ORDER — GABAPENTIN 300 MG PO CAPS
600.0000 mg | ORAL_CAPSULE | Freq: Two times a day (BID) | ORAL | Status: DC
  Administered 2015-02-17 – 2015-02-19 (×5): 600 mg via ORAL
  Filled 2015-02-17 (×6): qty 2

## 2015-02-17 MED ORDER — MORPHINE SULFATE 2 MG/ML IJ SOLN
2.0000 mg | INTRAMUSCULAR | Status: DC | PRN
  Administered 2015-02-17 (×4): 2 mg via INTRAVENOUS
  Filled 2015-02-17 (×4): qty 1

## 2015-02-17 MED ORDER — AMLODIPINE BESYLATE 10 MG PO TABS
10.0000 mg | ORAL_TABLET | Freq: Every day | ORAL | Status: DC
  Administered 2015-02-17 – 2015-02-19 (×3): 10 mg via ORAL
  Filled 2015-02-17 (×3): qty 1

## 2015-02-17 MED ORDER — GADOBENATE DIMEGLUMINE 529 MG/ML IV SOLN
20.0000 mL | Freq: Once | INTRAVENOUS | Status: AC | PRN
  Administered 2015-02-17: 20 mL via INTRAVENOUS

## 2015-02-17 MED ORDER — ALBUTEROL SULFATE (2.5 MG/3ML) 0.083% IN NEBU: 2.5000 mg | INHALATION_SOLUTION | Freq: Four times a day (QID) | RESPIRATORY_TRACT | Status: DC

## 2015-02-17 MED ORDER — MORPHINE SULFATE 4 MG/ML IJ SOLN
4.0000 mg | Freq: Once | INTRAMUSCULAR | Status: AC
  Administered 2015-02-17: 4 mg via INTRAVENOUS
  Filled 2015-02-17: qty 1

## 2015-02-17 MED ORDER — GUAIFENESIN-DM 100-10 MG/5ML PO SYRP
5.0000 mL | ORAL_SOLUTION | ORAL | Status: DC | PRN
  Filled 2015-02-17: qty 5

## 2015-02-17 NOTE — Consult Note (Signed)
Consult of [facility]  Reason for Consult: Choledocholithiasis Referring Physician: [facility]  Treyshaun Keatts HPI: This is a 54 year old male s/p Roux-en-Y gastric bypass, HTN, hyperlipidemia, DM, CRI, and OSA admitted for acute onset of abdominal pain.  The pain started at 8 AM after PO intake of a bagel.  He started to have abdominal pain in his RUQ and central abdomen.  As a result of the pain he presented to the ER.  Initially it was thought that the patient may have an internal hernia as a result of his prior gastric bypass, but further evaluation reveals the possibility of choledocholithiasis.  He has a large calcified gallstone and his CBD is mildly dilated at 7 mm with concurrent obstructive pattern with his liver panel and hyperbilirubinemia.  There is mild intrahepatic biliary ductal dilation on the MRCP.  Past Medical History  Diagnosis Date  . Hypertension   . Diabetes mellitus without complication   . Arthritis   . Tuberculosis     tb positive 1989 ?   Marland Kitchen Depression   . PONV (postoperative nausea and vomiting)   . Chronic kidney disease 02/2013    ACUTE RENAL FAILURE  . HLD (hyperlipidemia)   . OSA (obstructive sleep apnea)   . Alcohol abuse     Past Surgical History  Procedure Laterality Date  . Knee arthroscopy    . Gastric bypass    . Spinal fusion      Harrington Rod  . Total knee arthroplasty Left 12/11/2014    Procedure: TOTAL KNEE ARTHROPLASTY;  Surgeon: Gean Birchwood, MD;  Location: [facility] OR;  Service: Orthopedics;  Laterality: Left;    Family History  Problem Relation Age of Onset  . Diabetes Mother   . Hypertension Mother   . Colon cancer Mother   . Stomach cancer Father     Social History:  reports that he has never smoked. He has never used smokeless tobacco. He reports that he does not drink alcohol or use illicit drugs.  Allergies:  Allergies  Allergen Reactions  . Oxycodone Itching and Other (See Comments)    "Makes me feel Bad"     Medications:  Scheduled: . amLODipine  10 mg Oral Daily  . gabapentin  600 mg Oral BID  . heparin  5,000 Units Subcutaneous 3 times per day  . insulin aspart  0-9 Units Subcutaneous TID WC  . insulin glargine  7 Units Subcutaneous Daily  . lidocaine  1 patch Transdermal Q24H  . potassium chloride  40 mEq Oral Once   Continuous: . sodium chloride Stopped (02/17/15 6578)    Results for orders placed or performed during the hospital encounter of 02/16/15 (from the past 24 hour(s))  Urinalysis, Routine w reflex microscopic     Status: Abnormal   Collection Time: 02/16/15  9:33 PM  Result Value Ref Range   Color, Urine YELLOW YELLOW   APPearance CLEAR CLEAR   Specific Gravity, Urine 1.015 1.005 - 1.030   pH 5.5 5.0 - 8.0   Glucose, UA NEGATIVE NEGATIVE mg/dL   Hgb urine dipstick NEGATIVE NEGATIVE   Bilirubin Urine NEGATIVE NEGATIVE   Ketones, ur NEGATIVE NEGATIVE mg/dL   Protein, ur 30 (A) NEGATIVE mg/dL   Urobilinogen, UA 1.0 0.0 - 1.0 mg/dL   Nitrite NEGATIVE NEGATIVE   Leukocytes, UA NEGATIVE NEGATIVE  Urine microscopic-add on     Status: Abnormal   Collection Time: 02/16/15  9:33 PM  Result Value Ref Range   Squamous Epithelial /  LPF RARE RARE   WBC, UA 0-2 <3 WBC/hpf   RBC / HPF 0-2 <3 RBC/hpf   Bacteria, UA RARE RARE   Casts HYALINE CASTS (A) NEGATIVE  CBC with Differential     Status: Abnormal   Collection Time: 02/16/15  9:37 PM  Result Value Ref Range   WBC 9.9 4.0 - 10.5 K/uL   RBC 4.43 4.22 - 5.81 MIL/uL   Hemoglobin 11.4 (L) 13.0 - 17.0 g/dL   HCT 16.1 (L) 09.6 - 04.5 %   MCV 78.6 78.0 - 100.0 fL   MCH 25.7 (L) 26.0 - 34.0 pg   MCHC 32.8 30.0 - 36.0 g/dL   RDW 40.9 81.1 - 91.4 %   Platelets 576 (H) 150 - 400 K/uL   Neutrophils Relative % 81 (H) 43 - 77 %   Neutro Abs 7.9 (H) 1.7 - 7.7 K/uL   Lymphocytes Relative 15 12 - 46 %   Lymphs Abs 1.5 0.7 - 4.0 K/uL   Monocytes Relative 4 3 - 12 %   Monocytes Absolute 0.4 0.1 - 1.0 K/uL   Eosinophils  Relative 0 0 - 5 %   Eosinophils Absolute 0.0 0.0 - 0.7 K/uL   Basophils Relative 0 0 - 1 %   Basophils Absolute 0.0 0.0 - 0.1 K/uL  Comprehensive metabolic panel     Status: Abnormal   Collection Time: 02/16/15  9:37 PM  Result Value Ref Range   Sodium 135 135 - 145 mmol/L   Potassium 3.1 (L) 3.5 - 5.1 mmol/L   Chloride 99 (L) 101 - 111 mmol/L   CO2 23 22 - 32 mmol/L   Glucose, Bld 120 (H) 65 - 99 mg/dL   BUN 21 (H) 6 - 20 mg/dL   Creatinine, Ser 7.82 (H) 0.61 - 1.24 mg/dL   Calcium 8.9 8.9 - 95.6 mg/dL   Total Protein 7.5 6.5 - 8.1 g/dL   Albumin 3.3 (L) 3.5 - 5.0 g/dL   AST 213 (H) 15 - 41 U/L   ALT 196 (H) 17 - 63 U/L   Alkaline Phosphatase 303 (H) 38 - 126 U/L   Total Bilirubin 1.7 (H) 0.3 - 1.2 mg/dL   GFR calc non Af Amer 58 (L) >60 mL/min   GFR calc Af Amer >60 >60 mL/min   Anion gap 13 5 - 15  Lipase, blood     Status: None   Collection Time: 02/16/15  9:37 PM  Result Value Ref Range   Lipase 38 22 - 51 U/L  Ethanol     Status: None   Collection Time: 02/16/15 11:39 PM  Result Value Ref Range   Alcohol, Ethyl (B) <5 <5 mg/dL  Hepatitis panel, acute     Status: None   Collection Time: 02/17/15  1:20 AM  Result Value Ref Range   Hepatitis B Surface Ag NEGATIVE NEGATIVE   HCV Ab NEGATIVE NEGATIVE   Hep A IgM NON REACTIVE NON REACTIVE   Hep B C IgM NON REACTIVE NON REACTIVE  Glucose, capillary     Status: Abnormal   Collection Time: 02/17/15  4:38 AM  Result Value Ref Range   Glucose-Capillary 159 (H) 65 - 99 mg/dL  Protime-INR     Status: None   Collection Time: 02/17/15  5:42 AM  Result Value Ref Range   Prothrombin Time 14.9 11.6 - 15.2 seconds   INR 1.15 0.00 - 1.49  APTT     Status: None   Collection Time: 02/17/15  5:42 AM  Result Value Ref Range   aPTT 35 24 - 37 seconds  Magnesium     Status: Abnormal   Collection Time: 02/17/15  5:42 AM  Result Value Ref Range   Magnesium 1.4 (L) 1.7 - 2.4 mg/dL  Comprehensive metabolic panel     Status: Abnormal    Collection Time: 02/17/15  5:42 AM  Result Value Ref Range   Sodium 136 135 - 145 mmol/L   Potassium 3.4 (L) 3.5 - 5.1 mmol/L   Chloride 102 101 - 111 mmol/L   CO2 25 22 - 32 mmol/L   Glucose, Bld 122 (H) 65 - 99 mg/dL   BUN 20 6 - 20 mg/dL   Creatinine, Ser 1.61 (H) 0.61 - 1.24 mg/dL   Calcium 8.2 (L) 8.9 - 10.3 mg/dL   Total Protein 6.1 (L) 6.5 - 8.1 g/dL   Albumin 2.6 (L) 3.5 - 5.0 g/dL   AST 096 (H) 15 - 41 U/L   ALT 257 (H) 17 - 63 U/L   Alkaline Phosphatase 273 (H) 38 - 126 U/L   Total Bilirubin 2.9 (H) 0.3 - 1.2 mg/dL   GFR calc non Af Amer 55 (L) >60 mL/min   GFR calc Af Amer >60 >60 mL/min   Anion gap 9 5 - 15  CBC     Status: Abnormal   Collection Time: 02/17/15  5:42 AM  Result Value Ref Range   WBC 12.0 (H) 4.0 - 10.5 K/uL   RBC 3.79 (L) 4.22 - 5.81 MIL/uL   Hemoglobin 10.0 (L) 13.0 - 17.0 g/dL   HCT 04.5 (L) 40.9 - 81.1 %   MCV 78.6 78.0 - 100.0 fL   MCH 26.4 26.0 - 34.0 pg   MCHC 33.6 30.0 - 36.0 g/dL   RDW 91.4 78.2 - 95.6 %   Platelets 478 (H) 150 - 400 K/uL  Lipid panel     Status: Abnormal   Collection Time: 02/17/15  5:42 AM  Result Value Ref Range   Cholesterol 174 0 - 200 mg/dL   Triglycerides 69 <213 mg/dL   HDL 51 >08 mg/dL   Total CHOL/HDL Ratio 3.4 RATIO   VLDL 14 0 - 40 mg/dL   LDL Cholesterol 657 (H) 0 - 99 mg/dL  Glucose, capillary     Status: Abnormal   Collection Time: 02/17/15  8:08 AM  Result Value Ref Range   Glucose-Capillary 129 (H) 65 - 99 mg/dL  Type and screen     Status: None   Collection Time: 02/17/15  8:18 AM  Result Value Ref Range   ABO/RH(D) A POS    Antibody Screen NEG    Sample Expiration 02/20/2015   Lactic acid, plasma     Status: None   Collection Time: 02/17/15  8:18 AM  Result Value Ref Range   Lactic Acid, Venous 1.0 0.5 - 2.0 mmol/L  Glucose, capillary     Status: None   Collection Time: 02/17/15 11:13 AM  Result Value Ref Range   Glucose-Capillary 96 65 - 99 mg/dL  Glucose, capillary     Status: None    Collection Time: 02/17/15  4:19 PM  Result Value Ref Range   Glucose-Capillary 95 65 - 99 mg/dL     Ct Abdomen Pelvis W Contrast  02/17/2015   CLINICAL DATA:  Gastric bypass. Abdominal pain with nausea and vomiting after eating a bagel.  EXAM: CT ABDOMEN AND PELVIS WITH CONTRAST  TECHNIQUE: Multidetector CT imaging of the abdomen and pelvis was performed using  the standard protocol following bolus administration of intravenous contrast.  CONTRAST:  100mL OMNIPAQUE IOHEXOL 300 MG/ML  SOLN  COMPARISON:  None.  FINDINGS: BODY WALL: No contributory findings.  LOWER CHEST: Minimal retained oral contrast in lower esophagus, but no indication of obstructive food bolus. Probable tiny hiatal hernia.  ABDOMEN/PELVIS:  Liver: No focal abnormality.  Biliary: Mild intrahepatic biliary duct dilation. There is no focal narrowing of the biliary tree. Large calcified gallstone, without inflammatory change. A smoothly contoured ovoid nodule along the anterior/upper wall of the gallbladder is likely a small lobule of liver given isoenhancement and discrete inner gallbladder wall.  Pancreas: Unremarkable.  Spleen: Unremarkable.  Adrenals: 1 cm nodule in the right adrenal gland is presumably a adenoma, although not specific on postcontrast only imaging.  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 There is symmetric mild fullness of the upper collecting system. No ureteral obstruction or stone identified. Small bilateral presumed cysts. There is a simple 17 mm cyst exophytic from the left lower pole  Bladder: Unremarkable.  Reproductive: Penile pump with reservoir on the right.  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 Normal appendix. Distal colonic diverticulosis.  Retroperitoneum: No mass or adenopathy.  Peritoneum: No ascites or pneumoperitoneum.  Vascular: Linear calcification in the left common femoral  vein likely from remote venous access or remote thrombosis.  OSSEOUS: Posterior spinal fixation from T10-L3 with complete posterior element bony fusion. 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 Canal stenosis also present at L3-4, mainly from posterior element overgrowth and hardware. There is advanced adjacent level disc and facet degeneration with advanced bilateral foraminal stenosis at L3-4 and L4-5.  IMPRESSION: 1. Cholelithiasis without evidence of acute cholecystitis. 2. Mild intrahepatic biliary dilatation without visible cause. Correlate with liver function tests. 3. Gastric bypass without complication. 4. Incidental findings noted above.   Electronically Signed   By: Marnee SpringJonathon  Watts M.D.   On: 02/17/2015 01:01   Mr 3d Recon At Scanner  02/17/2015   CLINICAL DATA:  Right upper quadrant/epigastric abdominal pain, nausea/vomiting. Cholelithiasis. Mild intrahepatic ductal dilatation.  EXAM: MRI ABDOMEN WITHOUT AND WITH CONTRAST (INCLUDING MRCP)  TECHNIQUE: Multiplanar multisequence MR imaging of the abdomen was performed both before and after the administration of intravenous contrast. Heavily T2-weighted images of the biliary and pancreatic ducts were obtained, and three-dimensional MRCP images were rendered by post processing.  CONTRAST:  20mL MULTIHANCE GADOBENATE DIMEGLUMINE 529 MG/ML IV SOLN  COMPARISON:  CT abdomen pelvis and abdominal ultrasound dated 02/17/2015.  FINDINGS: Lower chest:  Lung bases are clear.  Hepatobiliary: Liver is notable for mild hepatic steatosis. No suspicious/ enhancing hepatic lesions.  2.7 cm gallstone. No gallbladder wall thickening or pericholecystic fluid.  No intrahepatic ductal dilatation. Common duct is mildly prominent, measuring 7 mm. Suspected 5 mm distal CBD stone (series 9/image 65).  Pancreas: Within normal limits.  Spleen: Within normal limits.  Adrenals/Urinary Tract: 12 mm right adrenal cyst (series 5/  image 27).  Left adrenal gland is within normal limits.  Scattered small bilateral renal cysts measuring up to 10 mm (series 5/ image 44). No enhancing renal lesions. No hydronephrosis.  Stomach/Bowel: Postsurgical changes related to prior gastric bypass.  Visualized bowel is otherwise unremarkable.  Vascular/Lymphatic: No evidence abdominal aortic aneurysm.  No suspicious abdominal lymphadenopathy.  Other: No abdominal ascites.  Musculoskeletal: Postsurgical changes/susceptibility artifact related to lumbar spine fixation.  IMPRESSION: Cholelithiasis, without associated  MR findings to suggest acute cholecystitis.  Common duct is mildly prominent, measuring 7 mm. Choledocholithiasis with suspected 5 mm distal CBD stone. Consider ERCP.  Additional ancillary findings as above.  These results will be called to the ordering clinician or representative by the Radiologist Assistant, and communication documented in the PACS or zVision Dashboard.   Electronically Signed   By: Charline Bills M.D.   On: 02/17/2015 13:21   US Abdomen Limited  02/17/2015   CLINICAL DATA:  Abdominal pain, elevated LFTs, large gallstone.  EXAM: US ABDOMEN LIMITED - RIGHT UPPER QUADRANT  COMPARISON:  CT earlier this day.  FINDINGS: Gallbladder:  Physiologically distended and contains a shadowing 2.4 cm gallstone. No gallbladder wall thickening, wall thickness of 2 mm. Sonographic Eulah Pont sign is negative.  Common bile duct:  Diameter: 10 mm at the porta hepatis. Distal common bile duct obscured.  Liver:  No focal lesion identified. Diffusely increased and heterogeneous in parenchymal echogenicity.  IMPRESSION: 1. Cholelithiasis without secondary findings of acute cholecystitis. 2. Biliary dilatation, common bile duct of 10 mm. Cause of biliary dilatation is not delineated, MRCP could be considered for further evaluation if patient is able tolerate breath hold technique. 3. Hepatic steatosis.   Electronically Signed   By: Rubye Oaks M.D.    On: 02/17/2015 02:29   Mr Abd W/wo Cm/mrcp  02/17/2015   CLINICAL DATA:  Right upper quadrant/epigastric abdominal pain, nausea/vomiting. Cholelithiasis. Mild intrahepatic ductal dilatation.  EXAM: MRI ABDOMEN WITHOUT AND WITH CONTRAST (INCLUDING MRCP)  TECHNIQUE: Multiplanar multisequence MR imaging of the abdomen was performed both before and after the administration of intravenous contrast. Heavily T2-weighted images of the biliary and pancreatic ducts were obtained, and three-dimensional MRCP images were rendered by post processing.  CONTRAST:  20mL MULTIHANCE GADOBENATE DIMEGLUMINE 529 MG/ML IV SOLN  COMPARISON:  CT abdomen pelvis and abdominal ultrasound dated 02/17/2015.  FINDINGS: Lower chest:  Lung bases are clear.  Hepatobiliary: Liver is notable for mild hepatic steatosis. No suspicious/ enhancing hepatic lesions.  2.7 cm gallstone. No gallbladder wall thickening or pericholecystic fluid.  No intrahepatic ductal dilatation. Common duct is mildly prominent, measuring 7 mm. Suspected 5 mm distal CBD stone (series 9/image 65).  Pancreas: Within normal limits.  Spleen: Within normal limits.  Adrenals/Urinary Tract: 12 mm right adrenal cyst (series 5/ image 27).  Left adrenal gland is within normal limits.  Scattered small bilateral renal cysts measuring up to 10 mm (series 5/ image 44). No enhancing renal lesions. No hydronephrosis.  Stomach/Bowel: Postsurgical changes related to prior gastric bypass.  Visualized bowel is otherwise unremarkable.  Vascular/Lymphatic: No evidence abdominal aortic aneurysm.  No suspicious abdominal lymphadenopathy.  Other: No abdominal ascites.  Musculoskeletal: Postsurgical changes/susceptibility artifact related to lumbar spine fixation.  IMPRESSION: Cholelithiasis, without associated MR findings to suggest acute cholecystitis.  Common duct is mildly prominent, measuring 7 mm. Choledocholithiasis with suspected 5 mm distal CBD stone. Consider ERCP.  Additional ancillary  findings as above.  These results will be called to the ordering clinician or representative by the Radiologist Assistant, and communication documented in the PACS or zVision Dashboard.   Electronically Signed   By: Charline Bills M.D.   On: 02/17/2015 13:21    ROS:  As stated above in the HPI otherwise negative.  Blood pressure 117/65, pulse 75, temperature 97.6 F (36.4 C), temperature source Oral, resp. rate 17, height 5\' 11"  (1.803 m), weight 112.9 kg (248 lb 14.4 oz), SpO2 96 %.    PE: Gen: NAD, Alert  and Oriented HEENT:  [place]/AT, EOMI Neck: Supple, no LAD Lungs: CTA Bilaterally CV: RRR without M/G/R ABM: Soft, NTND, +BS Ext: No C/C/E  Assessment/Plan: 1) Choledocholithiasis. 2) Cholelithiasis. 3) S/p Roux-en-Y gastric bypass. 4) ABM pain.   Cases such as this patient are extremely difficult as a result of the altered anatomy.  I have attempted ERCP in the past with only one successful procedure.  I will defer further management to [facility], however, if the patient clinically worsens he will require a percutaneous drain.  It is possible for IR to push a stone out of the CBD.  Alternatively, if Surgery wants to perform a cholecystectomy tomorrow or early next week and intraoperative ERCP can be performed.  In this instance an approach through the remnant gastric lumen would be created laparoscopically, which I have performed one time previously with Dr. Ezzard Standing.  Plan: 1) Pain control. 2) Antibiotics.  Kanita Delage D 02/17/2015, 6:45 PM

## 2015-02-17 NOTE — H&P (Signed)
[facility] History and Physical  Angel Starrhomas Shives [id number] DOB: Oct 07, 1960 DOA: 02/16/2015  Referring physician: ED physician PCP: Eula ListenMCKINLEY,DOMINIC, MD  Specialists:   Chief Complaint: Abdominal pain  HPI: Angel Phillips is a 54 y.o. male with PMH of hypertension, hyperlipidemia, diabetes mellitus, depression, OSA, history of tuberculosis, chronic kidney disease-stage II, arthritis, history of gastric bypass surgery, who presents with pain.  Patient reports that his abdominal pain started at 8 AM. Pt states he ate a bagel earlier today and states he feels a piece was stuck in his GI track. He has attempted to drink fluids to move the object without any success. However, pt was able to tolerate fluids without difficult. Currently, his abdominal pain is located in 2 locations, right upper quadrant and central abdomen. It is constant, 10 out of 10 in severity, nonradiating. It is associated with chills, nausea and vomiting. He vomited 5-6 times. No blood in the vomitus. Patient does not have diarrhea or fever. He also reports having mild shortness of breath and intermittent cough, no sputum production.  Currently patient denies fever, running nose, ear pain, headaches, chest pain, diarrhea, constipation, dysuria, urgency, frequency, hematuria, skin rashes or leg swelling. No unilateral weakness, numbness or tingling sensations. No vision change or hearing loss.  In ED, patient was found to have negative urinalysis, lipase 38, normal temperature, no tachycardia, potassium 3.1, likely worsening renal function. CT abdomen/pelvis showed gallstone, no signs of cholecystitis. Abdominal ultrasound showed biliary duct dilation with common duct 10 cm. Patient is admitted to inpatient for further evaluation and treatment. The general surgeon was consulted.  Where does patient live?   At home   Can patient participate in ADLs?  Yes      Review of Systems:   General: no fevers, has chills, no  changes in body weight, has poor appetite, has fatigue HEENT: no blurry vision, hearing changes or sore throat Pulm: has mild dyspnea, coughing, No wheezing CV: no chest pain, palpitations Abd: has nausea, vomiting, abdominal pain, no diarrhea, constipation GU: no dysuria, burning on urination, increased urinary frequency, hematuria  Ext: no leg edema Neuro: no unilateral weakness, numbness, or tingling, no vision change or hearing loss Skin: no rash MSK: No muscle spasm, no deformity, no limitation of range of movement in spin Heme: No easy bruising.  Travel history: No recent long distant travel.  Allergy:  Allergies  Allergen Reactions  . Oxycodone Itching and Other (See Comments)    "Makes me feel Bad"    Past Medical History  Diagnosis Date  . Hypertension   . Diabetes mellitus without complication   . Arthritis   . Tuberculosis     tb positive 1989 ?   Marland Kitchen. Depression   . PONV (postoperative nausea and vomiting)   . Chronic kidney disease 02/2013    ACUTE RENAL FAILURE  . HLD (hyperlipidemia)   . OSA (obstructive sleep apnea)   . Alcohol abuse     Past Surgical History  Procedure Laterality Date  . Knee arthroscopy    . Gastric bypass    . Spinal fusion      Harrington Rod  . Total knee arthroplasty Left 12/11/2014    Procedure: TOTAL KNEE ARTHROPLASTY;  Surgeon: Gean BirchwoodFrank Rowan, MD;  Location[facility] OR;  Service: Orthopedics;  Laterality: Left;    Social History:  reports that he has never smoked. He has never used smokeless tobacco. He reports that he does not drink alcohol or use illicit drugs. Patient quit drinking alcohol 5 years  ago.  Family History:  Family History  Problem Relation Age of Onset  . Diabetes Mother   . Hypertension Mother   . Colon cancer Mother   . Stomach cancer Father      Prior to Admission medications   Medication Sig Start Date End Date Taking? Authorizing Provider  amLODipine (NORVASC) 10 MG tablet Take 1 tablet (10 mg total) by mouth  daily. For high blood pressure control 06/15/13  Yes Sanjuana Kava, NP  chlorthalidone (HYGROTON) 25 MG tablet Take 1 tablet (25 mg total) by mouth daily. For control of HTN/diabetes mellitus Patient taking differently: Take 25 mg by mouth daily with breakfast. For control of HTN/diabetes mellitus 06/15/13  Yes Sanjuana Kava, NP  Cyanocobalamin (VITAMIN B-12 IJ) Inject as directed every 30 (thirty) days.   Yes Historical Provider, MD  gabapentin (NEURONTIN) 300 MG capsule Take 2 capsules (600 mg total) by mouth 2 (two) times daily. For pain/anxiety 06/15/13  Yes Sanjuana Kava, NP  glipiZIDE (GLUCOTROL) 10 MG tablet Take 5 mg by mouth 2 (two) times daily before a meal.    Yes Historical Provider, MD  insulin glargine (LANTUS) 100 UNIT/ML injection Inject 10 Units into the skin daily.   Yes Historical Provider, MD  lidocaine (LIDODERM) 5 % Place 1 patch onto the skin daily. Remove & Discard patch within 12 hours or as directed by MD: For pain management 06/15/13  Yes Sanjuana Kava, NP  lisinopril (PRINIVIL,ZESTRIL) 20 MG tablet Take 0.5 tablets (10 mg total) by mouth daily. For high blood pressure control Patient taking differently: Take 20 mg by mouth daily. For high blood pressure control 06/15/13  Yes Sanjuana Kava, NP  metFORMIN (GLUCOPHAGE) 500 MG tablet Take 1,000 mg by mouth 2 (two) times daily with a meal.    Yes Historical Provider, MD  simvastatin (ZOCOR) 40 MG tablet Take 20 mg by mouth at bedtime.   Yes Historical Provider, MD  cyclobenzaprine (FLEXERIL) 5 MG tablet Take 1 tablet (5 mg total) by mouth 3 (three) times daily as needed for muscle spasms. For muscle spasm Patient not taking: Reported on 02/16/2015 12/11/14   Allena Katz, PA-C  HYDROmorphone (DILAUDID) 2 MG tablet Take two tablets by mouth every 6 hours as needed for severe pain; Take one tablet by mouth every 6 hours as needed for less severe pain Patient not taking: Reported on 02/16/2015 12/19/14   Oneal Grout, MD  XARELTO  STARTER PACK 15 & 20 MG TBPK Take 15-20 mg by mouth as directed. Take as directed on package: Start with one  tablet by mouth twice a day with food. On Day 22, switch to one  tablet once a day with food. Patient not taking: Reported on 02/16/2015 12/15/14   Francee Piccolo, PA-C    Physical Exam: Filed Vitals:   02/17/15 0130 02/17/15 0232 02/17/15 0245 02/17/15 0400  BP: 146/70 125/65 115/61 114/66  Pulse: 83 82 82 76  Temp:      TempSrc:      Resp:  20    SpO2: 92% 97% 93% 96%   General: Not in acute distress HEENT:       Eyes: PERRL, EOMI, no scleral icterus.       ENT: No discharge from the ears and nose, no pharynx injection, no tonsillar enlargement.        Neck: No JVD, no bruit, no mass felt. Heme: No neck lymph node enlargement. Cardiac: S1/S2, RRR, No murmurs, No gallops or rubs.  Pulm: Good air movement bilaterally. No rales, wheezing, rhonchi or rubs. Abd: Soft, nondistended, tender over RUQ and central abdomen, no rebound pain, no organomegaly, BS present. Ext: No edema bilaterally. 2+DP/PT pulse bilaterally. Musculoskeletal: No joint deformities, No joint redness or warmth, no limitation of ROM in spin. Skin: No rashes.  Neuro: Alert, oriented X3, cranial nerves II-XII grossly intact, muscle strength 5/5 in all extremities, sensation to light touch intact.  Psych: Patient is not psychotic, no suicidal or hemocidal ideation.  Labs on Admission:  Basic Metabolic Panel:  Recent Labs Lab 02/16/15 2137  NA 135  K 3.1*  CL 99*  CO2 23  GLUCOSE 120*  BUN 21*  CREATININE 1.34*  CALCIUM 8.9   Liver Function Tests:  Recent Labs Lab 02/16/15 2137  AST 435*  ALT 196*  ALKPHOS 303*  BILITOT 1.7*  PROT 7.5  ALBUMIN 3.3*    Recent Labs Lab 02/16/15 2137  LIPASE 38   No results for input(s): AMMONIA in the last 168 hours. CBC:  Recent Labs Lab 02/16/15 2137  WBC 9.9  NEUTROABS 7.9*  HGB 11.4*  HCT 34.8*  MCV 78.6  PLT 576*   Cardiac  Enzymes: No results for input(s): CKTOTAL, CKMB, CKMBINDEX, TROPONINI in the last 168 hours.  BNP (last 3 results) No results for input(s): BNP in the last 8760 hours.  ProBNP (last 3 results) No results for input(s): PROBNP in the last 8760 hours.  CBG: No results for input(s): GLUCAP in the last 168 hours.  Radiological Exams on Admission: Ct Abdomen Pelvis W Contrast  02/17/2015   CLINICAL DATA:  Gastric bypass. Abdominal pain with nausea and vomiting after eating a bagel.  EXAM: CT ABDOMEN AND PELVIS WITH CONTRAST  TECHNIQUE: Multidetector CT imaging of the abdomen and pelvis was performed using the standard protocol following bolus administration of intravenous contrast.  CONTRAST:  OMNIPAQUE IOHEXOL 300 MG/ML  SOLN  COMPARISON:  None.  FINDINGS: BODY WALL: No contributory findings.  LOWER CHEST: Minimal retained oral contrast in lower esophagus, but no indication of obstructive food bolus. Probable tiny hiatal hernia.  ABDOMEN/PELVIS:  Liver: No focal abnormality.  Biliary: Mild intrahepatic biliary duct dilation. There is no focal narrowing of the biliary tree. Large calcified gallstone, without inflammatory change. A smoothly contoured ovoid nodule along the anterior/upper wall of the gallbladder is likely a small lobule of liver given isoenhancement and discrete inner gallbladder wall.  Pancreas: Unremarkable.  Spleen: Unremarkable.  Adrenals: 1 cm nodule in the right adrenal gland is presumably a adenoma, although not specific on postcontrast only imaging.  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 There is symmetric mild fullness of the upper collecting system. No ureteral obstruction or stone identified. Small bilateral presumed cysts. There is a simple 17 mm cyst exophytic from the left lower pole  Bladder: Unremarkable.  Reproductive: Penile pump with reservoir on the right.  Bowel: Roux-en-Y gastric bypass without  evidence of obstruction or anastomotic inflammation/breakdown. Normal appendix. Distal colonic diverticulosis.  Retroperitoneum: No mass or adenopathy.  Peritoneum: No ascites or pneumoperitoneum.  Vascular: Linear calcification in the left common femoral vein likely from remote venous access or remote thrombosis.  OSSEOUS: Posterior spinal fixation from T10-L3 with complete posterior element bony fusion. 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 Canal stenosis also present at L3-4, mainly from posterior element overgrowth and hardware. There is advanced adjacent level disc and facet degeneration  with advanced bilateral foraminal stenosis at L3-4 and L4-5.  IMPRESSION: 1. Cholelithiasis without evidence of acute cholecystitis. 2. Mild intrahepatic biliary dilatation without visible cause. Correlate with liver function tests. 3. Gastric bypass without complication. 4. Incidental findings noted above.   Electronically Signed   By: Marnee Spring M.D.   On: 02/17/2015 01:01   US Abdomen Limited  02/17/2015   CLINICAL DATA:  Abdominal pain, elevated LFTs, large gallstone.  EXAM: US ABDOMEN LIMITED - RIGHT UPPER QUADRANT  COMPARISON:  CT earlier this day.  FINDINGS: Gallbladder:  Physiologically distended and contains a shadowing 2.4 cm gallstone. No gallbladder wall thickening, wall thickness of 2 mm. Sonographic Eulah Pont sign is negative.  Common bile duct:  Diameter: 10 mm at the porta hepatis. Distal common bile duct obscured.  Liver:  No focal lesion identified. Diffusely increased and heterogeneous in parenchymal echogenicity.  IMPRESSION: 1. Cholelithiasis without secondary findings of acute cholecystitis. 2. Biliary dilatation, common bile duct of 10 mm. Cause of biliary dilatation is not delineated, MRCP could be considered for further evaluation if patient is able tolerate breath hold technique. 3. Hepatic steatosis.   Electronically Signed   By:  Rubye Oaks M.D.   On: 02/17/2015 02:29    EKG: Not done in ED, will get one.   Assessment/Plan Principal Problem:   Abdominal pain Active Problems:   Sleep apnea (presumed)   ETOH abuse   Depression, major   Diabetes mellitus without complication   Chronic kidney disease, stage II (mild)   Hypokalemia   Elevated LFTs  Abdominal pain: CT scan has no signs of obstruction, no food bolus impaction.Patient does have large gallstone, but no signs of cholecystitis on CT scan.Ultrasound is without signs of pericholecystic fluid or thickened gallbladder wall. He does have a dilated biliary ducts, indicating a possible stone in duct. In consistent with this possibility, he has elevated LFTs.Another potential differential diagnosis is internal hernia given history of gastric bypass surgery. Since internal hernia can be missed by CT scan, and carries significant risk of perforation, will get surgeons consultation.  -will admit med-surg bed -pain control: When necessary morphine -Zofran for nausea when necessary -INR/ptt/type-screen -follow up surgeons recommendation -IVF: 1L bolus and followed by 100 cc/h  Elevated LFT: compared to previous CMP on 11/21/14, AST 42--->4 35, ALT 63--->1 96, ALP 62--->3 03, total bilirubin 0.7--->1.7. This is likely due to duct stone obstruction -will check hepatitis panel - repeat CMP in morning -May get GI consult for possible ERCP for stone extraction in AM  OSA: -CPAP  AoCKD-II: Baseline creatinine 1.2, his creatinine is 1.34, BUN 21. Likely due to prerenal secondary to dehydration and continuation of ACEI. -IVF as above -Follow up renal function by BMP -Avoid ACEI and NSAIDs -Hold Diuretics now  HTN: Patient is taking lisinopril, chlorthalidone and amlodipine at home -Continue amlodipine -Hold lisinopril and chlorthalidone due to AoCKD-II -IV hydralazine prn  HLD: No LDL was on record -Continue home medications: Zocor -Check  FLP  DM-II: Last A1c 10.8 on 12/11/14, poorly controlled. Patient is taking Lantus, glipizide and metformin at home -will decrease Lantus dose from 10-7 units daily -SSI  DVT ppx: SQ Heparin       Code Status: Full code Family Communication: None at bed side.  Disposition Plan: Admit to inpatient   Date of Service 02/17/2015    Lorretta Harp [facility] Pager [phone]  If 7PM-7AM, please contact night-coverage [email or web address] Password [email or web address] 02/17/2015, 4:32 AM

## 2015-02-17 NOTE — Progress Notes (Signed)
[facility] PROGRESS NOTE  Angel Phillips [id number] DOB: 1961-03-11 DOA: 02/16/2015  PCP: Eula ListenMCKINLEY,DOMINIC, MD  Brief HPI: 54 year old African-American male with a past medical history of hypertension, hyperlipidemia, diabetes, chronic kidney disease stage II with the history of gastric bypass surgery who presented with abdominal pain. He was noted to have abnormal LFTs. CT scan showed cholelithiasis with some intrahepatic biliary ductal dilatation. No cholecystitis was noted. Ultrasound of the abdomen also revealed biliary data patient. Patient was admitted to the hospital for further management. Concern was about possible internal herniation considering his history of gastric bypass surgery.  Past medical history:  Past Medical History  Diagnosis Date  . Hypertension   . Diabetes mellitus without complication   . Arthritis   . Tuberculosis     tb positive 1989 ?   Marland Kitchen. Depression   . PONV (postoperative nausea and vomiting)   . Chronic kidney disease 02/2013    ACUTE RENAL FAILURE  . HLD (hyperlipidemia)   . OSA (obstructive sleep apnea)   . Alcohol abuse     Consultants: Gen. 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 No evidence for acute DVT.  Antibiotics: None  Subjective: Patient states that his pain is better but he is getting pain medications. Hasn't had any further nausea, vomiting. Frustrated that he is unable to eat anything.  Objective: Vital Signs  Filed Vitals:   02/17/15 0245 02/17/15 0400 02/17/15 0450 02/17/15 0837  BP: 115/61 114/66 118/66 116/62  Pulse: 82 76 79 68  Temp:   98.8 F (37.1 C) 98.1 F (36.7 C)  TempSrc:   Oral Oral  Resp:   18 17  Height:   5\' 11"  (1.803 m)   Weight:   112.9 kg (248 lb 14.4 oz)   SpO2: 93% 96% 95% 96%    Intake/Output Summary (Last 24 hours) at 02/17/15 0911 Last data filed at 02/17/15 0857  Gross per 24 hour  Intake 108.33 ml  Output    600  ml  Net -491.67 ml   Filed Weights   02/17/15 0450  Weight: 112.9 kg (248 lb 14.4 oz)    General appearance: alert, cooperative, appears stated age and no distress Resp: clear to auscultation bilaterally Cardio: regular rate and rhythm, S1, S2 normal, no murmur, click, rub or gallop GI: Abdomen is soft. There is tenderness in the upper abdomen in the epigastric and right upper quadrant without any rebound, rigidity or guarding. No masses or organomegaly. Bowel sounds are present. Extremities: He was noted to have swelling of the left lower extremity more than the right. According to the patient this is chronic. He has had surgeries done to that extremity. Neurologic: No focal deficits  Lab Results:  Basic Metabolic Panel:  Recent Labs Lab 02/16/15 2137 02/17/15 0542  NA 135 136  K 3.1* 3.4*  CL 99* 102  CO2 23 25  GLUCOSE 120* 122*  BUN 21* 20  CREATININE 1.34* 1.41*  CALCIUM 8.9 8.2*  MG  --  1.4*   Liver Function Tests:  Recent Labs Lab 02/16/15 2137 02/17/15 0542  AST 435* 429*  ALT 196* 257*  ALKPHOS 303* 273*  BILITOT 1.7* 2.9*  PROT 7.5 6.1*  ALBUMIN 3.3* 2.6*    Recent Labs Lab 02/16/15 2137  LIPASE 38   CBC:  Recent Labs Lab 02/16/15 2137 02/17/15 0542  WBC 9.9 12.0*  NEUTROABS 7.9*  --   HGB 11.4* 10.0*  HCT 34.8* 29.8*  MCV 78.6 78.6  PLT 576* 478*   CBG:  Recent Labs Lab 02/17/15 0438 02/17/15 0808  GLUCAP 159* 129*     Studies/Results: Ct Abdomen Pelvis W Contrast  02/17/2015   CLINICAL DATA:  Gastric bypass. Abdominal pain with nausea and vomiting after eating a bagel.  EXAM: CT ABDOMEN AND PELVIS WITH CONTRAST  TECHNIQUE: Multidetector CT imaging of the abdomen and pelvis was performed using the standard protocol following bolus administration of intravenous contrast.  CONTRAST:  OMNIPAQUE IOHEXOL 300 MG/ML  SOLN  COMPARISON:  None.  FINDINGS: BODY WALL: No contributory findings.  LOWER CHEST: Minimal retained oral  contrast in lower esophagus, but no indication of obstructive food bolus. Probable tiny hiatal hernia.  ABDOMEN/PELVIS:  Liver: No focal abnormality.  Biliary: Mild intrahepatic biliary duct dilation. There is no focal narrowing of the biliary tree. Large calcified gallstone, without inflammatory change. A smoothly contoured ovoid nodule along the anterior/upper wall of the gallbladder is likely a small lobule of liver given isoenhancement and discrete inner gallbladder wall.  Pancreas: Unremarkable.  Spleen: Unremarkable.  Adrenals: 1 cm nodule in the right adrenal gland is presumably a adenoma, although not specific on postcontrast only imaging.  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 There is symmetric mild fullness of the upper collecting system. No ureteral obstruction or stone identified. Small bilateral presumed cysts. There is a simple 17 mm cyst exophytic from the left lower pole  Bladder: Unremarkable.  Reproductive: Penile pump with reservoir on the right.  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 Normal appendix. Distal colonic diverticulosis.  Retroperitoneum: No mass or adenopathy.  Peritoneum: No ascites or pneumoperitoneum.  Vascular: Linear calcification in the left common femoral vein likely from remote venous access or remote thrombosis.  OSSEOUS: Posterior spinal fixation from T10-L3 with complete posterior element bony fusion. 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 Canal stenosis also present at L3-4, mainly from posterior element overgrowth and hardware. There is advanced adjacent level disc and facet degeneration with advanced bilateral foraminal stenosis at L3-4 and L4-5.  IMPRESSION: 1. Cholelithiasis without evidence of acute cholecystitis. 2. Mild intrahepatic biliary dilatation without visible cause.  Correlate with liver function tests. 3. Gastric bypass without complication. 4. Incidental findings noted above.   Electronically Signed   By: Marnee Spring M.D.   On: 02/17/2015 01:01   US Abdomen Limited  02/17/2015   CLINICAL DATA:  Abdominal pain, elevated LFTs, large gallstone.  EXAM: US ABDOMEN LIMITED - RIGHT UPPER QUADRANT  COMPARISON:  CT earlier this day.  FINDINGS: Gallbladder:  Physiologically distended and contains a shadowing 2.4 cm gallstone. No gallbladder wall thickening, wall thickness of 2 mm. Sonographic Eulah Pont sign is negative.  Common bile duct:  Diameter: 10 mm at the porta hepatis. Distal common bile duct obscured.  Liver:  No focal lesion identified. Diffusely increased and heterogeneous in parenchymal echogenicity.  IMPRESSION: 1. Cholelithiasis without secondary findings of acute cholecystitis. 2. Biliary dilatation, common bile duct of 10 mm. Cause of biliary dilatation is not delineated, MRCP could be considered for further evaluation if patient is able tolerate breath hold technique. 3. Hepatic steatosis.   Electronically Signed   By: Rubye Oaks M.D.   On: 02/17/2015 02:29    Medications:  Scheduled: . amLODipine  10 mg Oral Daily  . gabapentin  600 mg Oral BID  . heparin  5,000 Units  Subcutaneous 3 times per day  . insulin aspart  0-9 Units Subcutaneous TID WC  . insulin glargine  7 Units Subcutaneous Daily  . lidocaine  1 patch Transdermal Q24H  . potassium chloride  40 mEq Oral Once  . simvastatin  20 mg Oral QHS   Continuous: . sodium chloride Stopped (02/17/15 1610)   RUE:AVWUJWJXB, guaiFENesin-dextromethorphan, hydrALAZINE, morphine injection, ondansetron (ZOFRAN) IV  Assessment/Plan:  Principal Problem:   Abdominal pain Active Problems:   Sleep apnea (presumed)   ETOH abuse   Depression, major   Diabetes mellitus without complication   Chronic kidney disease, stage II (mild)   Hypokalemia   Elevated LFTs    Upper Abdominal  pain Patient has a history of gastric bypass, which raises the possibility of internal hernia which can be missed by CT scan. But patient also has elevated LFTs with the biliary ductal dilatation noted on CT scan and ultrasound. MRCP is pending. General surgeries following. Pain control. Continue nothing by mouth status. Antiemetics as needed. Tinea IV fluids.   Transaminitis with hyperbilirubinemia  LFTs are significantly elevated. Hepatitis panel is pending. MRCP report is pending. There is concern for choledocholithiasis. This could be a complicated situation due to his altered anatomy from the gastric bypass. He may not be able to undergo a traditional stone extraction by ERCP. We'll need to discuss this issue further once MRCP report is available.   OSA CPAP  Acute on CKD-II with electrolyte imbalances Baseline creatinine 1.2. Continue IV fluids. Monitor urine output. Repeat labs in the morning. Avoid ACE inhibitor and NSAIDs. Holding diuretics. Replace electrolytes including potassium and magnesium.  Essential hypertension  Patient was taking lisinopril, chlorthalidone and amlodipine at home. Continue amlodipine. Hold lisinopril and chlorthalidone due to AoCKD-II. IV hydralazine prn  Hyperlipidemia  No LDL was on record. Hold his Zocor for now.   DM-II Last A1c 10.8 on 12/11/14, poorly controlled. Patient is taking Lantus, glipizide and metformin at home. Continue current dose of Lantus. Continue sliding scale coverage.  Lower extremity swelling, left more than right Venous Doppler showed chronic DVT without any acute process. He underwent a left knee replacement about a month and a half ago. No clear reason to initiate anticoagulation. We will place him on compression stockings.  DVT Prophylaxis: Subcutaneous heparin    Code Status: Full code  Family Communication: Discussed with the patient  Disposition Plan: Await MRCP. Await surgical input.   LOS: 0 days    [facility]  [facility] Pager [phone] 02/17/2015, 9:11 AM  If 7PM-7AM, please contact night-coverage at [email or web address], password [facility]

## 2015-02-17 NOTE — Progress Notes (Signed)
Patient ID: Angel Phillips, male   DOB: 1961/07/13, 54 y.o.   MRN: [id number]  Abdominal exam unchanged.  Reports pain has unchanged and comfortable with morphine.  Nausea "dry heaves" but no vomiting. Passing flatus.  MRCP completed, but not read.  Keep NPO.  Still cant rule out an internal hernia, may need a laparoscopy.  Serial abdominal exam.  Will follow closely.   Aris Even, ANP-BC

## 2015-02-17 NOTE — Consult Note (Signed)
Reason for Consult:Abd pain Referring Physician: Dr. Orvan Seen Angel Phillips is an 54 y.o. male.  HPI: The patient is a 54 year old male with a past medical history significant for hypertension, diabetes, chronic kidney disease with a past surgical history significant for gastric bypass in 2004.  Patient comes in today secondary to abdominal pain that he relates started after eating a bagel at 8 AM. He states that at that point he feels like he had a bolus of food that got stuck. He states that he has had previous issues with food getting "stuck" after overeating. Patient states that these episodes usually pass on their own. Patient states that he's had periumbilical abdominal pain. He states that with palpation and does have right upper quadrant pain. The patient did state that he had some nausea and vomiting on his way to the ER but has not had any since then. He did state that he is continuing with bowel function throughout the day.  Upon evaluation in the ER the patient has undergone a CT scan as well as ultrasound. Ultrasound reveals a 2.4 cm gallstone within the gallbladder as well as a distended gallbladder, no signs of acute cholecystitis. Patient also has a common bile duct measures at 10 mm. CT scan reveals similar findings with mild intrahepatic and extrahepatic bile duct dilatation. In discussion with the radiologist, there appears to be no swirl sign, or other signs of internal hernia, or any dilation of his small bowel.  The patient's laboratory studies reveal elevated LFTs.  The patient otherwise denies any fevers chills or diarrhea while at home.  Past Medical History  Diagnosis Date  . Hypertension   . Diabetes mellitus without complication   . Arthritis   . Tuberculosis     tb positive 1989 ?   Marland Kitchen Depression   . PONV (postoperative nausea and vomiting)   . Chronic kidney disease 02/2013    ACUTE RENAL FAILURE  . HLD (hyperlipidemia)   . OSA (obstructive sleep apnea)   . Alcohol  abuse     Past Surgical History  Procedure Laterality Date  . Knee arthroscopy    . Gastric bypass    . Spinal fusion      Harrington Rod  . Total knee arthroplasty Left 12/11/2014    Procedure: TOTAL KNEE ARTHROPLASTY;  Surgeon: Frederik Pear, MD;  Location: [place];  Service: Orthopedics;  Laterality: Left;    Family History  Problem Relation Age of Onset  . Diabetes Mother   . Hypertension Mother   . Colon cancer Mother   . Stomach cancer Father     Social History:  reports that he has never smoked. He has never used smokeless tobacco. He reports that he does not drink alcohol or use illicit drugs.  Allergies:  Allergies  Allergen Reactions  . Oxycodone Itching and Other (See Comments)    "Makes me feel Bad"    Medications: I have reviewed the patient's current medications.  Results for orders placed or performed during the hospital encounter of 02/16/15 (from the past 48 hour(s))  Urinalysis, Routine w reflex microscopic     Status: Abnormal   Collection Time: 02/16/15  9:33 PM  Result Value Ref Range   Color, Urine YELLOW YELLOW   APPearance CLEAR CLEAR   Specific Gravity, Urine 1.015 1.005 - 1.030   pH 5.5 5.0 - 8.0   Glucose, UA NEGATIVE NEGATIVE mg/dL   Hgb urine dipstick NEGATIVE NEGATIVE   Bilirubin Urine NEGATIVE NEGATIVE   Ketones,  ur NEGATIVE NEGATIVE mg/dL   Protein, ur 30 (A) NEGATIVE mg/dL   Urobilinogen, UA 1.0 0.0 - 1.0 mg/dL   Nitrite NEGATIVE NEGATIVE   Leukocytes, UA NEGATIVE NEGATIVE  Urine microscopic-add on     Status: Abnormal   Collection Time: 02/16/15  9:33 PM  Result Value Ref Range   Squamous Epithelial / LPF RARE RARE   WBC, UA 0-2 <3 WBC/hpf   RBC / HPF 0-2 <3 RBC/hpf   Bacteria, UA RARE RARE   Casts HYALINE CASTS (A) NEGATIVE  CBC with Differential     Status: Abnormal   Collection Time: 02/16/15  9:37 PM  Result Value Ref Range   WBC 9.9 4.0 - 10.5 K/uL   RBC 4.43 4.22 - 5.81 MIL/uL   Hemoglobin 11.4 (L) 13.0 - 17.0 g/dL   HCT  34.8 (L) 39.0 - 52.0 %   MCV 78.6 78.0 - 100.0 fL   MCH 25.7 (L) 26.0 - 34.0 pg   MCHC 32.8 30.0 - 36.0 g/dL   RDW 15.2 11.5 - 15.5 %   Platelets 576 (H) 150 - 400 K/uL   Neutrophils Relative % 81 (H) 43 - 77 %   Neutro Abs 7.9 (H) 1.7 - 7.7 K/uL   Lymphocytes Relative 15 12 - 46 %   Lymphs Abs 1.5 0.7 - 4.0 K/uL   Monocytes Relative 4 3 - 12 %   Monocytes Absolute 0.4 0.1 - 1.0 K/uL   Eosinophils Relative 0 0 - 5 %   Eosinophils Absolute 0.0 0.0 - 0.7 K/uL   Basophils Relative 0 0 - 1 %   Basophils Absolute 0.0 0.0 - 0.1 K/uL  Comprehensive metabolic panel     Status: Abnormal   Collection Time: 02/16/15  9:37 PM  Result Value Ref Range   Sodium 135 135 - 145 mmol/L   Potassium 3.1 (L) 3.5 - 5.1 mmol/L   Chloride 99 (L) 101 - 111 mmol/L   CO2 23 22 - 32 mmol/L   Glucose, Bld 120 (H) 65 - 99 mg/dL   BUN 21 (H) 6 - 20 mg/dL   Creatinine, Ser 1.34 (H) 0.61 - 1.24 mg/dL   Calcium 8.9 8.9 - 10.3 mg/dL   Total Protein 7.5 6.5 - 8.1 g/dL   Albumin 3.3 (L) 3.5 - 5.0 g/dL   AST 435 (H) 15 - 41 U/L   ALT 196 (H) 17 - 63 U/L   Alkaline Phosphatase 303 (H) 38 - 126 U/L   Total Bilirubin 1.7 (H) 0.3 - 1.2 mg/dL   GFR calc non Af Amer 58 (L) >60 mL/min   GFR calc Af Amer >60 >60 mL/min    Comment: (NOTE) The eGFR has been calculated using the CKD EPI equation. This calculation has not been validated in all clinical situations. eGFR's persistently <60 mL/min signify possible Chronic Kidney Disease.    Anion gap 13 5 - 15  Lipase, blood     Status: None   Collection Time: 02/16/15  9:37 PM  Result Value Ref Range   Lipase 38 22 - 51 U/L  Ethanol     Status: None   Collection Time: 02/16/15 11:39 PM  Result Value Ref Range   Alcohol, Ethyl (B) <5 <5 mg/dL    Comment:        LOWEST DETECTABLE LIMIT FOR SERUM ALCOHOL IS 11 mg/dL FOR MEDICAL PURPOSES ONLY   Glucose, capillary     Status: Abnormal   Collection Time: 02/17/15  4:38 AM  Result Value  Ref Range   Glucose-Capillary  159 (H) 65 - 99 mg/dL    Ct Abdomen Pelvis W Contrast  02/17/2015   CLINICAL DATA:  Gastric bypass. Abdominal pain with nausea and vomiting after eating a bagel.  EXAM: CT ABDOMEN AND PELVIS WITH CONTRAST  TECHNIQUE: Multidetector CT imaging of the abdomen and pelvis was performed using the standard protocol following bolus administration of intravenous contrast.  CONTRAST:  147m OMNIPAQUE IOHEXOL 300 MG/ML  SOLN  COMPARISON:  None.  FINDINGS: BODY WALL: No contributory findings.  LOWER CHEST: Minimal retained oral contrast in lower esophagus, but no indication of obstructive food bolus. Probable tiny hiatal hernia.  ABDOMEN/PELVIS:  Liver: No focal abnormality.  Biliary: Mild intrahepatic biliary duct dilation. There is no focal narrowing of the biliary tree. Large calcified gallstone, without inflammatory change. A smoothly contoured ovoid nodule along the anterior/upper wall of the gallbladder is likely a small lobule of liver given isoenhancement and discrete inner gallbladder wall.  Pancreas: Unremarkable.  Spleen: Unremarkable.  Adrenals: 1 cm nodule in the right adrenal gland is presumably a adenoma, although not specific on postcontrast only imaging.  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 There is symmetric mild fullness of the upper collecting system. No ureteral obstruction or stone identified. Small bilateral presumed cysts. There is a simple 17 mm cyst exophytic from the left lower pole  Bladder: Unremarkable.  Reproductive: Penile pump with reservoir on the right.  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 Normal appendix. Distal colonic diverticulosis.  Retroperitoneum: No mass or adenopathy.  Peritoneum: No ascites or pneumoperitoneum.  Vascular: Linear calcification in the left common femoral vein likely from remote venous access or remote thrombosis.  OSSEOUS: Posterior  spinal fixation from T10-L3 with complete posterior element bony fusion. 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 Canal stenosis also present at L3-4, mainly from posterior element overgrowth and hardware. There is advanced adjacent level disc and facet degeneration with advanced bilateral foraminal stenosis at L3-4 and L4-5.  IMPRESSION: 1. Cholelithiasis without evidence of acute cholecystitis. 2. Mild intrahepatic biliary dilatation without visible cause. Correlate with liver function tests. 3. Gastric bypass without complication. 4. Incidental findings noted above.   Electronically Signed   By: JMonte FantasiaM.D.   On: 02/17/2015 01:01   [place]dom[place] Limited  02/17/2015   CLINICAL DATA:  Abdominal pain, elevated LFTs, large gallstone.  EXAM: [place] LIMITED - RIGHT UPPER QUADRANT  COMPARISON:  CT earlier this day.  FINDINGS: Gallbladder:  Physiologically distended and contains a shadowing 2.4 cm gallstone. No gallbladder wall thickening, wall thickness of 2 mm. Sonographic MPercell Millersign is negative.  Common bile duct:  Diameter: 10 mm at the porta hepatis. Distal common bile duct obscured.  Liver:  No focal lesion identified. Diffusely increased and heterogeneous in parenchymal echogenicity.  IMPRESSION: 1. Cholelithiasis without secondary findings of acute cholecystitis. 2. Biliary dilatation, common bile duct of 10 mm. Cause of biliary dilatation is not delineated, MRCP could be considered for further evaluation if patient is able tolerate breath hold technique. 3. Hepatic steatosis.   Electronically Signed   By: MJeb LeveringM.D.   On: 02/17/2015 02:29    Review of Systems  Constitutional: Negative for fever and chills.  HENT: Negative.   Eyes: Negative.   Respiratory: Negative.   Cardiovascular: Negative.   Gastrointestinal: Positive for nausea, vomiting and abdominal pain. Negative for  heartburn, diarrhea and constipation.   Musculoskeletal: Negative.   Neurological: Negative.    Blood pressure 118/66, pulse 79, temperature 98.8 F (37.1 C), temperature source Oral, resp. rate 18, height _0  (1.803 m), weight 112.9 kg (248 lb 14.4 oz), SpO2 95 %. Physical Exam  Constitutional: He is oriented to person, place, and time. He appears well-developed and well-nourished.  HENT:  Head: Normocephalic and atraumatic.  Eyes: Conjunctivae and EOM are normal. Pupils are equal, round, and reactive to light.  Neck: Normal range of motion. Neck supple.  Cardiovascular: Normal rate, regular rhythm and normal heart sounds.   Respiratory: Effort normal and breath sounds normal.  GI: Soft. Bowel sounds are normal. He exhibits distension (min). There is tenderness (periumbilically and RUQ). There is no rebound and no guarding.  Musculoskeletal: Normal range of motion.  Neurological: He is alert and oriented to person, place, and time.  Skin: Skin is warm and dry.    Assessment/Plan: 54 year old male with nonspecific abdominal pain status post gastric bypass with bile duct dilatation and elevated LFTs.  The most concerning thing about the patient's current clinical status is the fact that he potentially could have a internal hernia secondary to bypass surgery that is undetected by CT scan. Patient also appears to have common bile duct dilatation which could potentially be secondary to a common bile duct stone that was not able to be visualized via CT scan or ultrasound. Secondary to the fact the patient has had bypass surgery he is anatomy is likely unamenable to ERCP. At this point would recommend MRCP to visualize the biliary tree.  1. We'll continue with serial abdominal exams. I long discussion with the patient that secondary to his history of gastric bypass surgery is a possibility that he may just require a diagnostic laparoscopy to visualize for possible internal hernia. Discussed with the radiologist there appears to be no  signs of internal hernia with no dilated small bowel or transition points. A lactate is is currently pending. I would recommend continuing nothing by mouth status. 2. Would be reasonable to obtain an MRCP to visualize his common bile duct to evaluate for any possible stones causing ductal dilatation. 3.  We'll continue to follow patient closely. I will discussed patient with Dr. Hulen Phillips in regards to possible diagnostic laparoscopy should abdominal pain not resolve.    Angel Jacks., Angel Phillips 02/17/2015, 5:09 AM

## 2015-02-17 NOTE — Progress Notes (Signed)
New Admission Note:   Arrival: Via stretcher with tech from ED Mental Orientation: A&Ox4 Telemetry: None ordered Assessment:  See doc flowsheet Skin: Intact.  Old scar on mid, lower back from past back surgery.  Left knee scar from recent surgery.  No wounds noted. IV: Left AC IV Pain: Patient stated pain is "tolerable," and refuses pain medication at this time. Safety Measures:  Call bell placed within reach; patient instructed on use of call bell and verbalized understanding. Bed in lowest position.  Non-skid socks on.   [address] [place] Orientation: Patient oriented to staff, room, and unit. Family: None at bedside  Orders have been reviewed and implemented. Will continue to monitor.  Rozann Leschesebecca Malvin Morrish, RN, BSN

## 2015-02-17 NOTE — Progress Notes (Signed)
VASCULAR LAB PRELIMINARY  PRELIMINARY  PRELIMINARY  PRELIMINARY  Bilateral lower extremity venous Dopplers completed.    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  There is no obvious evidence of acute DVT or SVT in the bilateral lower extremities.   Asheton Viramontes, RVT 02/17/2015, 12:06 PM

## 2015-02-17 NOTE — Progress Notes (Signed)
Pt agitated and irritable because he cannot eat. Explained to patient several times the reasoning and necessity of him being NPO and that we are waiting on results of MRCP. Pt continues to be very upset. Attempted to provide emotional support. Will continue to monitor.   Carrie MewJasmine Toy Samarin, RN

## 2015-02-18 DIAGNOSIS — N189 Chronic kidney disease, unspecified: Secondary | ICD-10-CM

## 2015-02-18 DIAGNOSIS — N179 Acute kidney failure, unspecified: Secondary | ICD-10-CM

## 2015-02-18 DIAGNOSIS — E876 Hypokalemia: Secondary | ICD-10-CM

## 2015-02-18 LAB — CBC
HCT: 31.2 % — ABNORMAL LOW (ref 39.0–52.0)
Hemoglobin: 10.1 g/dL — ABNORMAL LOW (ref 13.0–17.0)
MCH: 25.5 pg — ABNORMAL LOW (ref 26.0–34.0)
MCHC: 32.4 g/dL (ref 30.0–36.0)
MCV: 78.8 fL (ref 78.0–100.0)
Platelets: 431 10*3/uL — ABNORMAL HIGH (ref 150–400)
RBC: 3.96 MIL/uL — ABNORMAL LOW (ref 4.22–5.81)
RDW: 15.6 % — ABNORMAL HIGH (ref 11.5–15.5)
WBC: 11.6 10*3/uL — AB (ref 4.0–10.5)

## 2015-02-18 LAB — COMPREHENSIVE METABOLIC PANEL
ALBUMIN: 2.8 g/dL — AB (ref 3.5–5.0)
ALT: 182 U/L — AB (ref 17–63)
ANION GAP: 9 (ref 5–15)
AST: 137 U/L — ABNORMAL HIGH (ref 15–41)
Alkaline Phosphatase: 279 U/L — ABNORMAL HIGH (ref 38–126)
BUN: 13 mg/dL (ref 6–20)
CALCIUM: 8.3 mg/dL — AB (ref 8.9–10.3)
CHLORIDE: 102 mmol/L (ref 101–111)
CO2: 22 mmol/L (ref 22–32)
Creatinine, Ser: 1.42 mg/dL — ABNORMAL HIGH (ref 0.61–1.24)
GFR calc Af Amer: >60 mL/min (ref >60)
GFR calc non Af Amer: 54 mL/min — ABNORMAL LOW (ref >60)
Glucose, Bld: 172 mg/dL — ABNORMAL HIGH (ref 65–99)
POTASSIUM: 3.3 mmol/L — AB (ref 3.5–5.1)
Sodium: 133 mmol/L — ABNORMAL LOW (ref 135–145)
TOTAL PROTEIN: 6.4 g/dL — AB (ref 6.5–8.1)
Total Bilirubin: 5.4 mg/dL — ABNORMAL HIGH (ref 0.3–1.2)

## 2015-02-18 LAB — GLUCOSE, CAPILLARY
GLUCOSE-CAPILLARY: 201 mg/dL — AB (ref 65–99)
Glucose-Capillary: 165 mg/dL — ABNORMAL HIGH (ref 65–99)
Glucose-Capillary: 163 mg/dL — ABNORMAL HIGH (ref 65–99)
Glucose-Capillary: 233 mg/dL — ABNORMAL HIGH (ref 65–99)

## 2015-02-18 LAB — MAGNESIUM: MAGNESIUM: 1.4 mg/dL — AB (ref 1.7–2.4)

## 2015-02-18 MED ORDER — PIPERACILLIN-TAZOBACTAM 3.375 G IVPB
3.3750 g | Freq: Three times a day (TID) | INTRAVENOUS | Status: DC
  Administered 2015-02-18 – 2015-02-19 (×5): 3.375 g via INTRAVENOUS
  Filled 2015-02-18 (×7): qty 50

## 2015-02-18 MED ORDER — POTASSIUM CHLORIDE CRYS ER 20 MEQ PO TBCR
40.0000 meq | EXTENDED_RELEASE_TABLET | Freq: Once | ORAL | Status: AC
  Administered 2015-02-18: 40 meq via ORAL
  Filled 2015-02-18: qty 2

## 2015-02-18 MED ORDER — MAGNESIUM SULFATE 2 GM/50ML IV SOLN
2.0000 g | Freq: Once | INTRAVENOUS | Status: AC
  Administered 2015-02-18: 2 g via INTRAVENOUS
  Filled 2015-02-18: qty 50

## 2015-02-18 MED ORDER — ACETAMINOPHEN 325 MG PO TABS
650.0000 mg | ORAL_TABLET | Freq: Four times a day (QID) | ORAL | Status: DC | PRN
  Administered 2015-02-18 – 2015-02-19 (×4): 650 mg via ORAL
  Filled 2015-02-18 (×4): qty 2

## 2015-02-18 NOTE — Progress Notes (Signed)
[facility] PROGRESS NOTE  Prosper Paff HYQ:[id number] DOB: [birth year] DOA: 02/16/2015  PCP: Eula Listen, MD  Brief HPI: 54 year old African-American male with a past medical history of hypertension, hyperlipidemia, diabetes, chronic kidney disease stage II with the history of gastric bypass surgery who presented with abdominal pain. He was noted to have abnormal LFTs. CT scan showed cholelithiasis with some intrahepatic biliary ductal dilatation. No cholecystitis was noted. Ultrasound of the abdomen also revealed biliary data patient. Patient was admitted to the hospital for further management. Concern was about possible internal herniation considering his history of gastric bypass surgery. MRCP showed possible stone in the bile duct.  Past medical history:  Past Medical History  Diagnosis Date  . Hypertension   . Diabetes mellitus without complication   . Arthritis   . Tuberculosis     tb positive 1989 ?   Marland Kitchen Depression   . PONV (postoperative nausea and vomiting)   . Chronic kidney disease 02/2013    ACUTE RENAL FAILURE  . HLD (hyperlipidemia)   . OSA (obstructive sleep apnea)   . Alcohol abuse     Consultants: Gen. 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 No evidence for acute DVT.  Antibiotics: None  Subjective: Patient states that the pain is under control with frequent pain medications. Still continues to have nausea with occasional dry heaves.   Objective: Vital Signs  Filed Vitals:   02/17/15 0837 02/17/15 1721 02/17/15 2142 02/18/15 0542  BP: 116/62 117/65 151/75 137/67  Pulse: 68 75 74 92  Temp: 98.1 F (36.7 C) 97.6 F (36.4 C) 98.9 F (37.2 C) 100.7 F (38.2 C)  TempSrc: Oral Oral Oral Oral  Resp: Height:      Weight:      SpO2: 96% 96% 100% 97%    Intake/Output Summary (Last 24 hours) at 02/18/15 0827 Last data filed at 02/18/15 9528  Gross  per 24 hour  Intake    840 ml  Output      0 ml  Net    840 ml   Filed Weights   02/17/15 0450  Weight: 112.9 kg (248 lb 14.4 oz)    General appearance: alert, cooperative, appears stated age and no distress Resp: clear to auscultation bilaterally Cardio: regular rate and rhythm, S1, S2 normal, no murmur, click, rub or gallop GI: Abdomen is soft. Continues to have tenderness in the upper abdomen in the epigastric and right upper quadrant without any rebound, rigidity or guarding. No masses or organomegaly. Bowel sounds are present. Extremities: He was noted to have swelling of the left lower extremity more than the right. According to the patient this is chronic. He has had surgeries done to that extremity.  Neurologic: No focal deficits  Lab Results:  Basic Metabolic Panel:  Recent Labs Lab 02/16/15 2137 02/17/15 0542 02/18/15 0704  NA 135 136 133*  K 3.1* 3.4* 3.3*  CL 99* 102 102  CO2 GLUCOSE 120* 122* 172*  BUN 21* 20 13  CREATININE 1.34* 1.41* 1.42*  CALCIUM 8.9 8.2* 8.3*  MG  --  1.4* 1.4*   Liver Function Tests:  Recent Labs Lab 02/16/15 2137 02/17/15 0542 02/18/15 0704  AST 435* 429* 137*  ALT 196* 257* 182*  ALKPHOS 303* 273* 279*  BILITOT 1.7* 2.9* 5.4*  PROT 7.5 6.1* 6.4*  ALBUMIN 3.3* 2.6* 2.8*    Recent Labs Lab 02/16/15 2137  LIPASE  38   CBC:  Recent Labs Lab 02/16/15 2137 02/17/15 0542 02/18/15 0704  WBC 9.9 12.0* 11.6*  NEUTROABS 7.9*  --   --   HGB 11.4* 10.0* 10.1*  HCT 34.8* 29.8* 31.2*  MCV 78.6 78.6 78.8  PLT 576* 478* 431*   CBG:  Recent Labs Lab 02/17/15 0808 02/17/15 1113 02/17/15 1619 02/17/15 2138 02/18/15 0740  GLUCAP 129* 96 95 122* 165*     Studies/Results: Ct Abdomen Pelvis W Contrast  02/17/2015   CLINICAL DATA:  Gastric bypass. Abdominal pain with nausea and vomiting after eating a bagel.  EXAM: CT ABDOMEN AND PELVIS WITH CONTRAST  TECHNIQUE: Multidetector CT imaging of the abdomen and  pelvis was performed using the standard protocol following bolus administration of intravenous contrast.  CONTRAST:  OMNIPAQUE IOHEXOL 300 MG/ML  SOLN  COMPARISON:  None.  FINDINGS: BODY WALL: No contributory findings.  LOWER CHEST: Minimal retained oral contrast in lower esophagus, but no indication of obstructive food bolus. Probable tiny hiatal hernia.  ABDOMEN/PELVIS:  Liver: No focal abnormality.  Biliary: Mild intrahepatic biliary duct dilation. There is no focal narrowing of the biliary tree. Large calcified gallstone, without inflammatory change. A smoothly contoured ovoid nodule along the anterior/upper wall of the gallbladder is likely a small lobule of liver given isoenhancement and discrete inner gallbladder wall.  Pancreas: Unremarkable.  Spleen: Unremarkable.  Adrenals: 1 cm nodule in the right adrenal gland is presumably a adenoma, although not specific on postcontrast only imaging.  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 There is symmetric mild fullness of the upper collecting system. No ureteral obstruction or stone identified. Small bilateral presumed cysts. There is a simple 17 mm cyst exophytic from the left lower pole  Bladder: Unremarkable.  Reproductive: Penile pump with reservoir on the right.  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 Normal appendix. Distal colonic diverticulosis.  Retroperitoneum: No mass or adenopathy.  Peritoneum: No ascites or pneumoperitoneum.  Vascular: Linear calcification in the left common femoral vein likely from remote venous access or remote thrombosis.  OSSEOUS: Posterior spinal fixation from T10-L3 with complete posterior element bony fusion. 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 Canal stenosis also present at L3-4, mainly from posterior element overgrowth and hardware.  There is advanced adjacent level disc and facet degeneration with advanced bilateral foraminal stenosis at L3-4 and L4-5.  IMPRESSION: 1. Cholelithiasis without evidence of acute cholecystitis. 2. Mild intrahepatic biliary dilatation without visible cause. Correlate with liver function tests. 3. Gastric bypass without complication. 4. Incidental findings noted above.   Electronically Signed   By: Marnee Spring M.D.   On: 02/17/2015 01:01   Mr 3d Recon At Scanner  02/17/2015   CLINICAL DATA:  Right upper quadrant/epigastric abdominal pain, nausea/vomiting. Cholelithiasis. Mild intrahepatic ductal dilatation.  EXAM: MRI ABDOMEN WITHOUT AND WITH CONTRAST (INCLUDING MRCP)  TECHNIQUE: Multiplanar multisequence MR imaging of the abdomen was performed both before and after the administration of intravenous contrast. Heavily T2-weighted images of the biliary and pancreatic ducts were obtained, and three-dimensional MRCP images were rendered by post processing.  CONTRAST:  20mL MULTIHANCE GADOBENATE DIMEGLUMINE 529 MG/ML IV SOLN  COMPARISON:  CT abdomen pelvis and abdominal ultrasound dated 02/17/2015.  FINDINGS: Lower chest:  Lung bases are clear.  Hepatobiliary: Liver is notable for mild hepatic steatosis. No suspicious/ enhancing hepatic lesions.  2.7 cm gallstone. No gallbladder wall thickening or  pericholecystic fluid.  No intrahepatic ductal dilatation. Common duct is mildly prominent, measuring 7 mm. Suspected 5 mm distal CBD stone (series 9/image 65).  Pancreas: Within normal limits.  Spleen: Within normal limits.  Adrenals/Urinary Tract: 12 mm right adrenal cyst (series 5/ image 27).  Left adrenal gland is within normal limits.  Scattered small bilateral renal cysts measuring up to 10 mm (series 5/ image 44). No enhancing renal lesions. No hydronephrosis.  Stomach/Bowel: Postsurgical changes related to prior gastric bypass.  Visualized bowel is otherwise unremarkable.  Vascular/Lymphatic: No evidence abdominal  aortic aneurysm.  No suspicious abdominal lymphadenopathy.  Other: No abdominal ascites.  Musculoskeletal: Postsurgical changes/susceptibility artifact related to lumbar spine fixation.  IMPRESSION: Cholelithiasis, without associated MR findings to suggest acute cholecystitis.  Common duct is mildly prominent, measuring 7 mm. Choledocholithiasis with suspected 5 mm distal CBD stone. Consider ERCP.  Additional ancillary findings as above.  These results will be called to the ordering clinician or representative by the Radiologist Assistant, and communication documented in the PACS or zVision Dashboard.   Electronically Signed   By: Charline Bills M.D.   On: 02/17/2015 13:21   US Abdomen Limited  02/17/2015   CLINICAL DATA:  Abdominal pain, elevated LFTs, large gallstone.  EXAM: US ABDOMEN LIMITED - RIGHT UPPER QUADRANT  COMPARISON:  CT earlier this day.  FINDINGS: Gallbladder:  Physiologically distended and contains a shadowing 2.4 cm gallstone. No gallbladder wall thickening, wall thickness of 2 mm. Sonographic Eulah Pont sign is negative.  Common bile duct:  Diameter: 10 mm at the porta hepatis. Distal common bile duct obscured.  Liver:  No focal lesion identified. Diffusely increased and heterogeneous in parenchymal echogenicity.  IMPRESSION: 1. Cholelithiasis without secondary findings of acute cholecystitis. 2. Biliary dilatation, common bile duct of 10 mm. Cause of biliary dilatation is not delineated, MRCP could be considered for further evaluation if patient is able tolerate breath hold technique. 3. Hepatic steatosis.   Electronically Signed   By: Rubye Oaks M.D.   On: 02/17/2015 02:29   Mr Abd W/wo Cm/mrcp  02/17/2015   CLINICAL DATA:  Right upper quadrant/epigastric abdominal pain, nausea/vomiting. Cholelithiasis. Mild intrahepatic ductal dilatation.  EXAM: MRI ABDOMEN WITHOUT AND WITH CONTRAST (INCLUDING MRCP)  TECHNIQUE: Multiplanar multisequence MR imaging of the abdomen was performed both  before and after the administration of intravenous contrast. Heavily T2-weighted images of the biliary and pancreatic ducts were obtained, and three-dimensional MRCP images were rendered by post processing.  CONTRAST:  20mL MULTIHANCE GADOBENATE DIMEGLUMINE 529 MG/ML IV SOLN  COMPARISON:  CT abdomen pelvis and abdominal ultrasound dated 02/17/2015.  FINDINGS: Lower chest:  Lung bases are clear.  Hepatobiliary: Liver is notable for mild hepatic steatosis. No suspicious/ enhancing hepatic lesions.  2.7 cm gallstone. No gallbladder wall thickening or pericholecystic fluid.  No intrahepatic ductal dilatation. Common duct is mildly prominent, measuring 7 mm. Suspected 5 mm distal CBD stone (series 9/image 65).  Pancreas: Within normal limits.  Spleen: Within normal limits.  Adrenals/Urinary Tract: 12 mm right adrenal cyst (series 5/ image 27).  Left adrenal gland is within normal limits.  Scattered small bilateral renal cysts measuring up to 10 mm (series 5/ image 44). No enhancing renal lesions. No hydronephrosis.  Stomach/Bowel: Postsurgical changes related to prior gastric bypass.  Visualized bowel is otherwise unremarkable.  Vascular/Lymphatic: No evidence abdominal aortic aneurysm.  No suspicious abdominal lymphadenopathy.  Other: No abdominal ascites.  Musculoskeletal: Postsurgical changes/susceptibility artifact related to lumbar spine fixation.  IMPRESSION: Cholelithiasis, without associated MR findings  to suggest acute cholecystitis.  Common duct is mildly prominent, measuring 7 mm. Choledocholithiasis with suspected 5 mm distal CBD stone. Consider ERCP.  Additional ancillary findings as above.  These results will be called to the ordering clinician or representative by the Radiologist Assistant, and communication documented in the PACS or zVision Dashboard.   Electronically Signed   By: Charline BillsSriyesh  Marquest Gunkel M.D.   On: 02/17/2015 13:21    Medications:  Scheduled: . amLODipine  10 mg Oral Daily  .  ciprofloxacin  400 mg Intravenous Q12H  . gabapentin  600 mg Oral BID  . heparin  5,000 Units Subcutaneous 3 times per day  . insulin aspart  0-9 Units Subcutaneous TID WC  . insulin glargine  7 Units Subcutaneous Daily  . lidocaine  1 patch Transdermal Q24H  . potassium chloride  40 mEq Oral Once   Continuous: . sodium chloride 100 mL/hr at 02/18/15 0046   OZH:YQMVHQIONPRN:albuterol, guaiFENesin-dextromethorphan, hydrALAZINE, morphine injection, ondansetron (ZOFRAN) IV  Assessment/Plan:  Principal Problem:   Abdominal pain Active Problems:   Sleep apnea (presumed)   ETOH abuse   Depression, major   Diabetes mellitus without complication   Chronic kidney disease, stage II (mild)   Hypokalemia   Elevated LFTs   Cholelithiasis without cholecystitis    Upper Abdominal pain Appears to be most likely due to a biliary process. Patient has a history of gastric bypass, which raised the possibility of internal hernia which can be missed by CT scan. But patient also has elevated LFTs with the biliary ductal dilatation noted on CT scan and ultrasound. General surgery is following. Pain control. IV fluids.   Transaminitis with hyperbilirubinemia  MRCP reveals cholelithiasis without cholecystitis. Choledocholithiasis is also suspected. This is the most likely reason for his presentation. His LFTs have shown some improvement today, but bilirubin has gone up. He is also noted to have a low-grade fever. He was started on ciprofloxacin yesterday, but has been changed over to Zosyn today. Hepatitis panel is negative. This is a complicated situation due to his altered anatomy from the gastric bypass. He may not be able to undergo a traditional stone extraction by ERCP. GI and gastroenterology to address this further.    OSA Continue CPAP  Acute on CKD-II with electrolyte imbalances Baseline creatinine 1.2. Creatinine has improved Continue IV fluids. Monitor urine output. Repeat labs in the morning. Avoid ACE  inhibitor and NSAIDs. Holding diuretics. Replace electrolytes including potassium and magnesium.  Essential hypertension  Patient was taking lisinopril, chlorthalidone and amlodipine at home. Continue amlodipine. Hold lisinopril and chlorthalidone due to AoCKD-II. IV hydralazine prn  Hyperlipidemia  No LDL was on record. Hold his Zocor for now due to transaminitis.   DM-II Last A1c 10.8 on 12/11/14, poorly controlled. Patient is taking Lantus, glipizide and metformin at home. Continue current dose of Lantus. Continue sliding scale coverage.  Lower extremity swelling, left more than right Venous Doppler showed chronic DVT without any acute process. He underwent a left knee replacement about a month and a half ago. No clear reason to initiate anticoagulation. We will place him on compression stockings. Patient informed me that he had been on coumadin previously for these clots.  DVT Prophylaxis: Subcutaneous heparin    Code Status: Full code  Family Communication: Discussed with the patient  Disposition Plan: Surgery and GI to confer. Not ready for discharge   LOS: 1 day   [facility] HospitalKRISHNAN,Mauri Tolen  [facility] Pager [phone] 02/18/2015, 8:27 AM  If 7PM-7AM, please contact night-coverage at  [email or web address], password [facility]

## 2015-02-18 NOTE — Progress Notes (Signed)
Pt does not wish to wear CPAP tonight. Pt does not wear at home. No distress noted. Pt encouraged to call RT if pt changes mind.

## 2015-02-18 NOTE — Progress Notes (Signed)
Utilization review completed.  

## 2015-02-18 NOTE — Progress Notes (Signed)
Progress Note for [facility]  Subjective: Overall he feels well.  Pain is under control.  Objective: Vital signs in last 24 hours: Temp:  [97.6 F (36.4 C)-100.7 F (38.2 C)] 100.7 F (38.2 C) (05/22 0542) Pulse Rate:  [68-92] 92 (05/22 0542) Resp:  [17-18] 17 (05/22 0542) BP: (116-151)/(62-75) 137/67 mmHg (05/22 0542) SpO2:  [96 %-100 %] 97 % (05/22 0542) Last BM Date: 02/16/15  Intake/Output from previous day: 05/21 0701 - 05/22 0700 In: 840 [P.O.:840] Out: 0  Intake/Output this shift:    General appearance: alert and no distress GI: tender in the RUQ and mid abdomen  Lab Results:  Recent Labs  02/16/15 2137 02/17/15 0542  WBC 9.9 12.0*  HGB 11.4* 10.0*  HCT 34.8* 29.8*  PLT 576* 478*   BMET  Recent Labs  02/16/15 2137 02/17/15 0542  NA 135 136  K 3.1* 3.4*  CL 99* 102  CO2 23 25  GLUCOSE 120* 122*  BUN 21* 20  CREATININE 1.34* 1.41*  CALCIUM 8.9 8.2*   LFT  Recent Labs  02/17/15 0542  PROT 6.1*  ALBUMIN 2.6*  AST 429*  ALT 257*  ALKPHOS 273*  BILITOT 2.9*   PT/INR  Recent Labs  02/17/15 0542  LABPROT 14.9  INR 1.15   Hepatitis Panel  Recent Labs  02/17/15 0120  HEPBSAG NEGATIVE  HCVAB NEGATIVE  HEPAIGM NON REACTIVE  HEPBIGM NON REACTIVE   C-Diff No results for input(s): CDIFFTOX in the last 72 hours. Fecal Lactopherrin No results for input(s): FECLLACTOFRN in the last 72 hours.  Studies/Results: Ct Abdomen Pelvis W Contrast  02/17/2015   CLINICAL DATA:  Gastric bypass. Abdominal pain with nausea and vomiting after eating a bagel.  EXAM: CT ABDOMEN AND PELVIS WITH CONTRAST  TECHNIQUE: Multidetector CT imaging of the abdomen and pelvis was performed using the standard protocol following bolus administration of intravenous contrast.  CONTRAST:  OMNIPAQUE IOHEXOL 300 MG/ML  SOLN  COMPARISON:  None.  FINDINGS: BODY WALL: No contributory findings.  LOWER CHEST: Minimal retained oral contrast in lower esophagus, but no  indication of obstructive food bolus. Probable tiny hiatal hernia.  ABDOMEN/PELVIS:  Liver: No focal abnormality.  Biliary: Mild intrahepatic biliary duct dilation. There is no focal narrowing of the biliary tree. Large calcified gallstone, without inflammatory change. A smoothly contoured ovoid nodule along the anterior/upper wall of the gallbladder is likely a small lobule of liver given isoenhancement and discrete inner gallbladder wall.  Pancreas: Unremarkable.  Spleen: Unremarkable.  Adrenals: 1 cm nodule in the right adrenal gland is presumably a adenoma, although not specific on postcontrast only imaging.  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 There is symmetric mild fullness of the upper collecting system. No ureteral obstruction or stone identified. Small bilateral presumed cysts. There is a simple 17 mm cyst exophytic from the left lower pole  Bladder: Unremarkable.  Reproductive: Penile pump with reservoir on the right.  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 Normal appendix. Distal colonic diverticulosis.  Retroperitoneum: No mass or adenopathy.  Peritoneum: No ascites or pneumoperitoneum.  Vascular: Linear calcification in the left common femoral vein likely from remote venous access or remote thrombosis.  OSSEOUS: Posterior spinal fixation from T10-L3 with complete posterior element bony fusion. 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 Canal stenosis also present at L3-4, mainly from posterior element overgrowth and hardware. There is advanced  adjacent level disc and facet degeneration with advanced bilateral foraminal stenosis at L3-4 and L4-5.  IMPRESSION: 1. Cholelithiasis without evidence of acute cholecystitis. 2. Mild intrahepatic biliary dilatation without visible cause. Correlate with liver function tests. 3.  Gastric bypass without complication. 4. Incidental findings noted above.   Electronically Signed   By: Marnee Spring M.D.   On: 02/17/2015 01:01   Mr 3d Recon At Scanner  02/17/2015   CLINICAL DATA:  Right upper quadrant/epigastric abdominal pain, nausea/vomiting. Cholelithiasis. Mild intrahepatic ductal dilatation.  EXAM: MRI ABDOMEN WITHOUT AND WITH CONTRAST (INCLUDING MRCP)  TECHNIQUE: Multiplanar multisequence MR imaging of the abdomen was performed both before and after the administration of intravenous contrast. Heavily T2-weighted images of the biliary and pancreatic ducts were obtained, and three-dimensional MRCP images were rendered by post processing.  CONTRAST:  20mL MULTIHANCE GADOBENATE DIMEGLUMINE 529 MG/ML IV SOLN  COMPARISON:  CT abdomen pelvis and abdominal ultrasound dated 02/17/2015.  FINDINGS: Lower chest:  Lung bases are clear.  Hepatobiliary: Liver is notable for mild hepatic steatosis. No suspicious/ enhancing hepatic lesions.  2.7 cm gallstone. No gallbladder wall thickening or pericholecystic fluid.  No intrahepatic ductal dilatation. Common duct is mildly prominent, measuring 7 mm. Suspected 5 mm distal CBD stone (series 9/image 65).  Pancreas: Within normal limits.  Spleen: Within normal limits.  Adrenals/Urinary Tract: 12 mm right adrenal cyst (series 5/ image 27).  Left adrenal gland is within normal limits.  Scattered small bilateral renal cysts measuring up to 10 mm (series 5/ image 44). No enhancing renal lesions. No hydronephrosis.  Stomach/Bowel: Postsurgical changes related to prior gastric bypass.  Visualized bowel is otherwise unremarkable.  Vascular/Lymphatic: No evidence abdominal aortic aneurysm.  No suspicious abdominal lymphadenopathy.  Other: No abdominal ascites.  Musculoskeletal: Postsurgical changes/susceptibility artifact related to lumbar spine fixation.  IMPRESSION: Cholelithiasis, without associated MR findings to suggest acute cholecystitis.  Common duct is  mildly prominent, measuring 7 mm. Choledocholithiasis with suspected 5 mm distal CBD stone. Consider ERCP.  Additional ancillary findings as above.  These results will be called to the ordering clinician or representative by the Radiologist Assistant, and communication documented in the PACS or zVision Dashboard.   Electronically Signed   By: Charline Bills M.D.   On: 02/17/2015 13:21   US Abdomen Limited  02/17/2015   CLINICAL DATA:  Abdominal pain, elevated LFTs, large gallstone.  EXAM: US ABDOMEN LIMITED - RIGHT UPPER QUADRANT  COMPARISON:  CT earlier this day.  FINDINGS: Gallbladder:  Physiologically distended and contains a shadowing 2.4 cm gallstone. No gallbladder wall thickening, wall thickness of 2 mm. Sonographic Eulah Pont sign is negative.  Common bile duct:  Diameter: 10 mm at the porta hepatis. Distal common bile duct obscured.  Liver:  No focal lesion identified. Diffusely increased and heterogeneous in parenchymal echogenicity.  IMPRESSION: 1. Cholelithiasis without secondary findings of acute cholecystitis. 2. Biliary dilatation, common bile duct of 10 mm. Cause of biliary dilatation is not delineated, MRCP could be considered for further evaluation if patient is able tolerate breath hold technique. 3. Hepatic steatosis.   Electronically Signed   By: Rubye Oaks M.D.   On: 02/17/2015 02:29   Mr Abd W/wo Cm/mrcp  02/17/2015   CLINICAL DATA:  Right upper quadrant/epigastric abdominal pain, nausea/vomiting. Cholelithiasis. Mild intrahepatic ductal dilatation.  EXAM: MRI ABDOMEN WITHOUT AND WITH CONTRAST (INCLUDING MRCP)  TECHNIQUE: Multiplanar multisequence MR imaging of the abdomen was performed both before and after the administration of intravenous contrast. Heavily T2-weighted images of the biliary  and pancreatic ducts were obtained, and three-dimensional MRCP images were rendered by post processing.  CONTRAST:  20mL MULTIHANCE GADOBENATE DIMEGLUMINE 529 MG/ML IV SOLN  COMPARISON:  CT  abdomen pelvis and abdominal ultrasound dated 02/17/2015.  FINDINGS: Lower chest:  Lung bases are clear.  Hepatobiliary: Liver is notable for mild hepatic steatosis. No suspicious/ enhancing hepatic lesions.  2.7 cm gallstone. No gallbladder wall thickening or pericholecystic fluid.  No intrahepatic ductal dilatation. Common duct is mildly prominent, measuring 7 mm. Suspected 5 mm distal CBD stone (series 9/image 65).  Pancreas: Within normal limits.  Spleen: Within normal limits.  Adrenals/Urinary Tract: 12 mm right adrenal cyst (series 5/ image 27).  Left adrenal gland is within normal limits.  Scattered small bilateral renal cysts measuring up to 10 mm (series 5/ image 44). No enhancing renal lesions. No hydronephrosis.  Stomach/Bowel: Postsurgical changes related to prior gastric bypass.  Visualized bowel is otherwise unremarkable.  Vascular/Lymphatic: No evidence abdominal aortic aneurysm.  No suspicious abdominal lymphadenopathy.  Other: No abdominal ascites.  Musculoskeletal: Postsurgical changes/susceptibility artifact related to lumbar spine fixation.  IMPRESSION: Cholelithiasis, without associated MR findings to suggest acute cholecystitis.  Common duct is mildly prominent, measuring 7 mm. Choledocholithiasis with suspected 5 mm distal CBD stone. Consider ERCP.  Additional ancillary findings as above.  These results will be called to the ordering clinician or representative by the Radiologist Assistant, and communication documented in the PACS or zVision Dashboard.   Electronically Signed   By: Charline BillsSriyesh  Krishnan M.D.   On: 02/17/2015 13:21    Medications:  Scheduled: . amLODipine  10 mg Oral Daily  . ciprofloxacin  400 mg Intravenous Q12H  . gabapentin  600 mg Oral BID  . heparin  5,000 Units Subcutaneous 3 times per day  . insulin aspart  0-9 Units Subcutaneous TID WC  . insulin glargine  7 Units Subcutaneous Daily  . lidocaine  1 patch Transdermal Q24H  . potassium chloride  40 mEq Oral Once    Continuous: . sodium chloride 100 mL/hr at 02/18/15 0046    Assessment/Plan: 1) Choledocholithiasis. 2) Cholelithiasis. 3) Roux-en-Y gastric bypass.   The patient was noted to have a temp of 100 F.  He did miss a dose of his pain medication, which resulted in a significant amount of pain, but he is better now.  I will place him on the schedule for an ERCP with [facility] for tomorrow, however, I do not know it it will be feasible or successful with the gastric bypass.  Plan: 1) Continue with pain control. 2) Continue with Cipro.   LOS: 1 day   Kendel Pesnell D 02/18/2015, 7:42 AM

## 2015-02-18 NOTE — Progress Notes (Signed)
Patient ID: Angel Phillips, male   DOB: 07-16-1961, 54 y.o.   MRN: [id number]     [facility]      [address] [place], [place] [address]    Phone: [phone] FAX: [phone]     Subjective: Pain managed by morphine.  Nausea, no vomiting.  Low grade temp.  WBC down.  No vomiting. MCP + for cholecystitis and CBD stone. LFTs are up with T bili 5.4  Objective:  Vital signs:  Filed Vitals:   02/17/15 0837 02/17/15 1721 02/17/15 2142 02/18/15 0542  BP: 116/62 117/65 151/75 137/67  Pulse: 68 75 74 92  Temp: 98.1 F (36.7 C) 97.6 F (36.4 C) 98.9 F (37.2 C) 100.7 F (38.2 C)  TempSrc: Oral Oral Oral Oral  Resp: _0 Height:      Weight:      SpO2: 96% 96% 100% 97%    Last BM Date: 02/16/15  Intake/Output   Yesterday:  05/21 0701 - 05/22 0700 In: 840 [P.O.:840] Out: 0  This shift: I/O last 3 completed shifts: In: 948.3 [P.O.:840; I.V.:108.3] Out: 600 [Urine:600]       Physical Exam: General: Pt awake/alert/oriented x4 in no acute distress Abdomen: Soft.  Nondistended.  Generalized ttp, but no peritonitis.  No incarcerated hernias.    Problem List:   Principal Problem:   Abdominal pain Active Problems:   Sleep apnea (presumed)   ETOH abuse   Depression, major   Diabetes mellitus without complication   Chronic kidney disease, stage II (mild)   Hypokalemia   Elevated LFTs   Cholelithiasis without cholecystitis    Results:   Labs: Results for orders placed or performed during the hospital encounter of 02/16/15 (from the past 48 hour(s))  Urinalysis, Routine w reflex microscopic     Status: Abnormal   Collection Time: 02/16/15  9:33 PM  Result Value Ref Range   Color, Urine YELLOW YELLOW   APPearance CLEAR CLEAR   Specific Gravity, Urine 1.015 1.005 - 1.030   pH 5.5 5.0 - 8.0   Glucose, UA NEGATIVE NEGATIVE mg/dL   Hgb urine dipstick NEGATIVE NEGATIVE   Bilirubin Urine NEGATIVE NEGATIVE    Ketones, ur NEGATIVE NEGATIVE mg/dL   Protein, ur 30 (A) NEGATIVE mg/dL   Urobilinogen, UA 1.0 0.0 - 1.0 mg/dL   Nitrite NEGATIVE NEGATIVE   Leukocytes, UA NEGATIVE NEGATIVE  Urine microscopic-add on     Status: Abnormal   Collection Time: 02/16/15  9:33 PM  Result Value Ref Range   Squamous Epithelial / LPF RARE RARE   WBC, UA 0-2 <3 WBC/hpf   RBC / HPF 0-2 <3 RBC/hpf   Bacteria, UA RARE RARE   Casts HYALINE CASTS (A) NEGATIVE  CBC with Differential     Status: Abnormal   Collection Time: 02/16/15  9:37 PM  Result Value Ref Range   WBC 9.9 4.0 - 10.5 K/uL   RBC 4.43 4.22 - 5.81 MIL/uL   Hemoglobin 11.4 (L) 13.0 - 17.0 g/dL   HCT 34.8 (L) 39.0 - 52.0 %   MCV 78.6 78.0 - 100.0 fL   MCH 25.7 (L) 26.0 - 34.0 pg   MCHC 32.8 30.0 - 36.0 g/dL   RDW 15.2 11.5 - 15.5 %   Platelets 576 (H) 150 - 400 K/uL   Neutrophils Relative % 81 (H) 43 - 77 %   Neutro Abs 7.9 (H) 1.7 - 7.7 K/uL   Lymphocytes Relative 15 12 -  46 %   Lymphs Abs 1.5 0.7 - 4.0 K/uL   Monocytes Relative 4 3 - 12 %   Monocytes Absolute 0.4 0.1 - 1.0 K/uL   Eosinophils Relative 0 0 - 5 %   Eosinophils Absolute 0.0 0.0 - 0.7 K/uL   Basophils Relative 0 0 - 1 %   Basophils Absolute 0.0 0.0 - 0.1 K/uL  Comprehensive metabolic panel     Status: Abnormal   Collection Time: 02/16/15  9:37 PM  Result Value Ref Range   Sodium 135 135 - 145 mmol/L   Potassium 3.1 (L) 3.5 - 5.1 mmol/L   Chloride 99 (L) 101 - 111 mmol/L   CO2 23 22 - 32 mmol/L   Glucose, Bld 120 (H) 65 - 99 mg/dL   BUN 21 (H) 6 - 20 mg/dL   Creatinine, Ser 1.34 (H) 0.61 - 1.24 mg/dL   Calcium 8.9 8.9 - 10.3 mg/dL   Total Protein 7.5 6.5 - 8.1 g/dL   Albumin 3.3 (L) 3.5 - 5.0 g/dL   AST 435 (H) 15 - 41 U/L   ALT 196 (H) 17 - 63 U/L   Alkaline Phosphatase 303 (H) 38 - 126 U/L   Total Bilirubin 1.7 (H) 0.3 - 1.2 mg/dL   GFR calc non Af Amer 58 (L) >60 mL/min   GFR calc Af Amer >60 >60 mL/min    Comment: (NOTE) The eGFR has been calculated using the CKD  EPI equation. This calculation has not been validated in all clinical situations. eGFR's persistently <60 mL/min signify possible Chronic Kidney Disease.    Anion gap 13 5 - 15  Lipase, blood     Status: None   Collection Time: 02/16/15  9:37 PM  Result Value Ref Range   Lipase 38 22 - 51 U/L  Ethanol     Status: None   Collection Time: 02/16/15 11:39 PM  Result Value Ref Range   Alcohol, Ethyl (B) <5 <5 mg/dL    Comment:        LOWEST DETECTABLE LIMIT FOR SERUM ALCOHOL IS 11 mg/dL FOR MEDICAL PURPOSES ONLY   Hepatitis panel, acute     Status: None   Collection Time: 02/17/15  1:20 AM  Result Value Ref Range   Hepatitis B Surface Ag NEGATIVE NEGATIVE   HCV Ab NEGATIVE NEGATIVE   Hep A IgM NON REACTIVE NON REACTIVE    Comment: (NOTE) Effective August 14, 2014, Hepatitis Acute Panel (test code [id number]) will be revised to automatically reflex to the Hepatitis C Viral RNA, Quantitative, Real-Time PCR assay if the Hepatitis C antibody screening result is Reactive. This action is being taken to ensure that the CDC/USPSTF recommended HCV diagnostic algorithm with the appropriate test reflex needed for accurate interpretation is followed.    Hep B C IgM NON REACTIVE NON REACTIVE    Comment: (NOTE) High levels of Hepatitis B Core IgM antibody are detectable during the acute stage of Hepatitis B. This antibody is used to differentiate current from past HBV infection. Performed at [facility]   Glucose, capillary     Status: Abnormal   Collection Time: 02/17/15  4:38 AM  Result Value Ref Range   Glucose-Capillary 159 (H) 65 - 99 mg/dL  Protime-INR     Status: None   Collection Time: 02/17/15  5:42 AM  Result Value Ref Range   Prothrombin Time 14.9 11.6 - 15.2 seconds   INR 1.15 0.00 - 1.49  APTT     Status: None  Collection Time: 02/17/15  5:42 AM  Result Value Ref Range   aPTT 35 24 - 37 seconds  Magnesium     Status: Abnormal   Collection Time: 02/17/15  5:42  AM  Result Value Ref Range   Magnesium 1.4 (L) 1.7 - 2.4 mg/dL  Comprehensive metabolic panel     Status: Abnormal   Collection Time: 02/17/15  5:42 AM  Result Value Ref Range   Sodium 136 135 - 145 mmol/L   Potassium 3.4 (L) 3.5 - 5.1 mmol/L   Chloride 102 101 - 111 mmol/L   CO2 25 22 - 32 mmol/L   Glucose, Bld 122 (H) 65 - 99 mg/dL   BUN 20 6 - 20 mg/dL   Creatinine, Ser 1.41 (H) 0.61 - 1.24 mg/dL   Calcium 8.2 (L) 8.9 - 10.3 mg/dL   Total Protein 6.1 (L) 6.5 - 8.1 g/dL   Albumin 2.6 (L) 3.5 - 5.0 g/dL   AST 429 (H) 15 - 41 U/L   ALT 257 (H) 17 - 63 U/L   Alkaline Phosphatase 273 (H) 38 - 126 U/L   Total Bilirubin 2.9 (H) 0.3 - 1.2 mg/dL   GFR calc non Af Amer 55 (L) >60 mL/min   GFR calc Af Amer >60 >60 mL/min    Comment: (NOTE) The eGFR has been calculated using the CKD EPI equation. This calculation has not been validated in all clinical situations. eGFR's persistently <60 mL/min signify possible Chronic Kidney Disease.    Anion gap 9 5 - 15  CBC     Status: Abnormal   Collection Time: 02/17/15  5:42 AM  Result Value Ref Range   WBC 12.0 (H) 4.0 - 10.5 K/uL   RBC 3.79 (L) 4.22 - 5.81 MIL/uL   Hemoglobin 10.0 (L) 13.0 - 17.0 g/dL   HCT 29.8 (L) 39.0 - 52.0 %   MCV 78.6 78.0 - 100.0 fL   MCH 26.4 26.0 - 34.0 pg   MCHC 33.6 30.0 - 36.0 g/dL   RDW 15.4 11.5 - 15.5 %   Platelets 478 (H) 150 - 400 K/uL  Lipid panel     Status: Abnormal   Collection Time: 02/17/15  5:42 AM  Result Value Ref Range   Cholesterol 174 0 - 200 mg/dL   Triglycerides 69 <150 mg/dL   HDL 51 >40 mg/dL   Total CHOL/HDL Ratio 3.4 RATIO   VLDL 14 0 - 40 mg/dL   LDL Cholesterol 109 (H) 0 - 99 mg/dL    Comment:        Total Cholesterol/HDL:CHD Risk Coronary Heart Disease Risk Table                     Men   Women  1/2 Average Risk   3.4   3.3  Average Risk       5.0   4.4  2 X Average Risk   9.6   7.1  3 X Average Risk  23.4   11.0        Use the calculated Patient Ratio above and the CHD  Risk Table to determine the patient's CHD Risk.        ATP III CLASSIFICATION (LDL):  <100     mg/dL   Optimal  100-129  mg/dL   Near or Above                    Optimal  130-159  mg/dL   Borderline  160-189  mg/dL   High  >190     mg/dL   Very High   Glucose, capillary     Status: Abnormal   Collection Time: 02/17/15  8:08 AM  Result Value Ref Range   Glucose-Capillary 129 (H) 65 - 99 mg/dL  Type and screen     Status: None   Collection Time: 02/17/15  8:18 AM  Result Value Ref Range   ABO/RH(D) A POS    Antibody Screen NEG    Sample Expiration 02/20/2015   Lactic acid, plasma     Status: None   Collection Time: 02/17/15  8:18 AM  Result Value Ref Range   Lactic Acid, Venous 1.0 0.5 - 2.0 mmol/L  Glucose, capillary     Status: None   Collection Time: 02/17/15 11:13 AM  Result Value Ref Range   Glucose-Capillary 96 65 - 99 mg/dL  Glucose, capillary     Status: None   Collection Time: 02/17/15  4:19 PM  Result Value Ref Range   Glucose-Capillary 95 65 - 99 mg/dL  Glucose, capillary     Status: Abnormal   Collection Time: 02/17/15  9:38 PM  Result Value Ref Range   Glucose-Capillary 122 (H) 65 - 99 mg/dL  CBC     Status: Abnormal   Collection Time: 02/18/15  7:04 AM  Result Value Ref Range   WBC 11.6 (H) 4.0 - 10.5 K/uL   RBC 3.96 (L) 4.22 - 5.81 MIL/uL   Hemoglobin 10.1 (L) 13.0 - 17.0 g/dL   HCT 31.2 (L) 39.0 - 52.0 %   MCV 78.8 78.0 - 100.0 fL   MCH 25.5 (L) 26.0 - 34.0 pg   MCHC 32.4 30.0 - 36.0 g/dL   RDW 15.6 (H) 11.5 - 15.5 %   Platelets 431 (H) 150 - 400 K/uL  Comprehensive metabolic panel     Status: Abnormal   Collection Time: 02/18/15  7:04 AM  Result Value Ref Range   Sodium 133 (L) 135 - 145 mmol/L   Potassium 3.3 (L) 3.5 - 5.1 mmol/L   Chloride 102 101 - 111 mmol/L   CO2 22 22 - 32 mmol/L   Glucose, Bld 172 (H) 65 - 99 mg/dL   BUN 13 6 - 20 mg/dL   Creatinine, Ser 1.42 (H) 0.61 - 1.24 mg/dL   Calcium 8.3 (L) 8.9 - 10.3 mg/dL   Total Protein 6.4  (L) 6.5 - 8.1 g/dL   Albumin 2.8 (L) 3.5 - 5.0 g/dL   AST 137 (H) 15 - 41 U/L   ALT 182 (H) 17 - 63 U/L   Alkaline Phosphatase 279 (H) 38 - 126 U/L   Total Bilirubin 5.4 (H) 0.3 - 1.2 mg/dL   GFR calc non Af Amer 54 (L) >60 mL/min   GFR calc Af Amer >60 >60 mL/min    Comment: (NOTE) The eGFR has been calculated using the CKD EPI equation. This calculation has not been validated in all clinical situations. eGFR's persistently <60 mL/min signify possible Chronic Kidney Disease.    Anion gap 9 5 - 15  Magnesium     Status: Abnormal   Collection Time: 02/18/15  7:04 AM  Result Value Ref Range   Magnesium 1.4 (L) 1.7 - 2.4 mg/dL  Glucose, capillary     Status: Abnormal   Collection Time: 02/18/15  7:40 AM  Result Value Ref Range   Glucose-Capillary 165 (H) 65 - 99 mg/dL    Imaging / Studies: Ct Abdomen Pelvis W Contrast  02/17/2015  CLINICAL DATA:  Gastric bypass. Abdominal pain with nausea and vomiting after eating a bagel.  EXAM: CT ABDOMEN AND PELVIS WITH CONTRAST  TECHNIQUE: Multidetector CT imaging of the abdomen and pelvis was performed using the standard protocol following bolus administration of intravenous contrast.  CONTRAST:  198m OMNIPAQUE IOHEXOL 300 MG/ML  SOLN  COMPARISON:  None.  FINDINGS: BODY WALL: No contributory findings.  LOWER CHEST: Minimal retained oral contrast in lower esophagus, but no indication of obstructive food bolus. Probable tiny hiatal hernia.  ABDOMEN/PELVIS:  Liver: No focal abnormality.  Biliary: Mild intrahepatic biliary duct dilation. There is no focal narrowing of the biliary tree. Large calcified gallstone, without inflammatory change. A smoothly contoured ovoid nodule along the anterior/upper wall of the gallbladder is likely a small lobule of liver given isoenhancement and discrete inner gallbladder wall.  Pancreas: Unremarkable.  Spleen: Unremarkable.  Adrenals: 1 cm nodule in the right adrenal gland is presumably a adenoma, although not specific  on postcontrast only imaging.  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 There is symmetric mild fullness of the upper collecting system. No ureteral obstruction or stone identified. Small bilateral presumed cysts. There is a simple 17 mm cyst exophytic from the left lower pole  Bladder: Unremarkable.  Reproductive: Penile pump with reservoir on the right.  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 Normal appendix. Distal colonic diverticulosis.  Retroperitoneum: No mass or adenopathy.  Peritoneum: No ascites or pneumoperitoneum.  Vascular: Linear calcification in the left common femoral vein likely from remote venous access or remote thrombosis.  OSSEOUS: Posterior spinal fixation from T10-L3 with complete posterior element bony fusion. 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 Canal stenosis also present at L3-4, mainly from posterior element overgrowth and hardware. There is advanced adjacent level disc and facet degeneration with advanced bilateral foraminal stenosis at L3-4 and L4-5.  IMPRESSION: 1. Cholelithiasis without evidence of acute cholecystitis. 2. Mild intrahepatic biliary dilatation without visible cause. Correlate with liver function tests. 3. Gastric bypass without complication. 4. Incidental findings noted above.   Electronically Signed   By: JMonte FantasiaM.D.   On: 02/17/2015 01:01   Mr 3d Recon At Scanner  02/17/2015   CLINICAL DATA:  Right upper quadrant/epigastric abdominal pain, nausea/vomiting. Cholelithiasis. Mild intrahepatic ductal dilatation.  EXAM: MRI ABDOMEN WITHOUT AND WITH CONTRAST (INCLUDING MRCP)  TECHNIQUE: Multiplanar multisequence MR imaging of the abdomen was performed both before and after the administration of intravenous contrast. Heavily T2-weighted images of the biliary and pancreatic  ducts were obtained, and three-dimensional MRCP images were rendered by post processing.  CONTRAST:  277mMULTIHANCE GADOBENATE DIMEGLUMINE 529 MG/ML IV SOLN  COMPARISON:  CT abdomen pelvis and abdominal ultrasound dated 02/17/2015.  FINDINGS: Lower chest:  Lung bases are clear.  Hepatobiliary: Liver is notable for mild hepatic steatosis. No suspicious/ enhancing hepatic lesions.  2.7 cm gallstone. No gallbladder wall thickening or pericholecystic fluid.  No intrahepatic ductal dilatation. Common duct is mildly prominent, measuring 7 mm. Suspected 5 mm distal CBD stone (series 9/image 65).  Pancreas: Within normal limits.  Spleen: Within normal limits.  Adrenals/Urinary Tract: 12 mm right adrenal cyst (series 5/ image 27).  Left adrenal gland is within normal limits.  Scattered small bilateral renal cysts measuring up to 10 mm (series 5/ image 44). No enhancing renal lesions. No hydronephrosis.  Stomach/Bowel: Postsurgical changes related to prior gastric bypass.  Visualized  bowel is otherwise unremarkable.  Vascular/Lymphatic: No evidence abdominal aortic aneurysm.  No suspicious abdominal lymphadenopathy.  Other: No abdominal ascites.  Musculoskeletal: Postsurgical changes/susceptibility artifact related to lumbar spine fixation.  IMPRESSION: Cholelithiasis, without associated MR findings to suggest acute cholecystitis.  Common duct is mildly prominent, measuring 7 mm. Choledocholithiasis with suspected 5 mm distal CBD stone. Consider ERCP.  Additional ancillary findings as above.  These results will be called to the ordering clinician or representative by the Radiologist Assistant, and communication documented in the PACS or zVision Dashboard.   Electronically Signed   By: Julian Hy M.D.   On: 02/17/2015 13:21   US Abdomen Limited  02/17/2015   CLINICAL DATA:  Abdominal pain, elevated LFTs, large gallstone.  EXAM: US ABDOMEN LIMITED - RIGHT UPPER QUADRANT  COMPARISON:  CT earlier this day.  FINDINGS:  Gallbladder:  Physiologically distended and contains a shadowing 2.4 cm gallstone. No gallbladder wall thickening, wall thickness of 2 mm. Sonographic Percell Miller sign is negative.  Common bile duct:  Diameter: 10 mm at the porta hepatis. Distal common bile duct obscured.  Liver:  No focal lesion identified. Diffusely increased and heterogeneous in parenchymal echogenicity.  IMPRESSION: 1. Cholelithiasis without secondary findings of acute cholecystitis. 2. Biliary dilatation, common bile duct of 10 mm. Cause of biliary dilatation is not delineated, MRCP could be considered for further evaluation if patient is able tolerate breath hold technique. 3. Hepatic steatosis.   Electronically Signed   By: Jeb Levering M.D.   On: 02/17/2015 02:29   Mr Abd W/wo Cm/mrcp  02/17/2015   CLINICAL DATA:  Right upper quadrant/epigastric abdominal pain, nausea/vomiting. Cholelithiasis. Mild intrahepatic ductal dilatation.  EXAM: MRI ABDOMEN WITHOUT AND WITH CONTRAST (INCLUDING MRCP)  TECHNIQUE: Multiplanar multisequence MR imaging of the abdomen was performed both before and after the administration of intravenous contrast. Heavily T2-weighted images of the biliary and pancreatic ducts were obtained, and three-dimensional MRCP images were rendered by post processing.  CONTRAST:  76m MULTIHANCE GADOBENATE DIMEGLUMINE 529 MG/ML IV SOLN  COMPARISON:  CT abdomen pelvis and abdominal ultrasound dated 02/17/2015.  FINDINGS: Lower chest:  Lung bases are clear.  Hepatobiliary: Liver is notable for mild hepatic steatosis. No suspicious/ enhancing hepatic lesions.  2.7 cm gallstone. No gallbladder wall thickening or pericholecystic fluid.  No intrahepatic ductal dilatation. Common duct is mildly prominent, measuring 7 mm. Suspected 5 mm distal CBD stone (series 9/image 65).  Pancreas: Within normal limits.  Spleen: Within normal limits.  Adrenals/Urinary Tract: 12 mm right adrenal cyst (series 5/ image 27).  Left adrenal gland is within  normal limits.  Scattered small bilateral renal cysts measuring up to 10 mm (series 5/ image 44). No enhancing renal lesions. No hydronephrosis.  Stomach/Bowel: Postsurgical changes related to prior gastric bypass.  Visualized bowel is otherwise unremarkable.  Vascular/Lymphatic: No evidence abdominal aortic aneurysm.  No suspicious abdominal lymphadenopathy.  Other: No abdominal ascites.  Musculoskeletal: Postsurgical changes/susceptibility artifact related to lumbar spine fixation.  IMPRESSION: Cholelithiasis, without associated MR findings to suggest acute cholecystitis.  Common duct is mildly prominent, measuring 7 mm. Choledocholithiasis with suspected 5 mm distal CBD stone. Consider ERCP.  Additional ancillary findings as above.  These results will be called to the ordering clinician or representative by the Radiologist Assistant, and communication documented in the PACS or zVision Dashboard.   Electronically Signed   By: SJulian HyM.D.   On: 02/17/2015 13:21    Medications / Allergies:  Scheduled Meds: . amLODipine  10 mg Oral Daily  .  gabapentin  600 mg Oral BID  . heparin  5,000 Units Subcutaneous 3 times per day  . insulin aspart  0-9 Units Subcutaneous TID WC  . insulin glargine  7 Units Subcutaneous Daily  . lidocaine  1 patch Transdermal Q24H  . piperacillin-tazobactam (ZOSYN)  IV  3.375 g Intravenous Q8H  . potassium chloride  40 mEq Oral Once   Continuous Infusions: . sodium chloride 100 mL/hr at 02/18/15 0046   PRN Meds:.albuterol, guaiFENesin-dextromethorphan, hydrALAZINE, morphine injection, ondansetron (ZOFRAN) IV  Antibiotics: Anti-infectives    Start     Dose/Rate Route Frequency Ordered Stop   02/18/15 1000  piperacillin-tazobactam (ZOSYN) IVPB 3.375 g     3.375 g 12.5 mL/hr over 240 Minutes Intravenous Every 8 hours 02/18/15 0901     02/17/15 2000  ciprofloxacin (CIPRO) IVPB 400 mg  Status:  Discontinued     400 mg 200 mL/hr over 60 Minutes Intravenous Every  12 hours 02/17/15 1908 02/18/15 0901        Assessment/Plan Acute cholecystitis  Choledocholithiasis  -will await ERCP, but not sure if it will be successful given hx gastric bypass.  Then we will discuss cholecystectomy. -trend LFTs ID-change cipro to zosyn given pain/fevers VTE prophylaxis-SCD/heparin.  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  Erby Pian, ANP-BC [facility]   02/18/2015 9:47 AM

## 2015-02-19 ENCOUNTER — Encounter (HOSPITAL_COMMUNITY)
Admission: EM | Disposition: A | Discharge status: Other Acute Inpatient Hospital | Payer: Self-pay | Source: Home / Self Care | Attending: Internal Medicine

## 2015-02-19 DIAGNOSIS — G4733 Obstructive sleep apnea (adult) (pediatric): Secondary | ICD-10-CM | POA: Diagnosis present

## 2015-02-19 DIAGNOSIS — G8929 Other chronic pain: Secondary | ICD-10-CM | POA: Diagnosis present

## 2015-02-19 DIAGNOSIS — K8050 Calculus of bile duct without cholangitis or cholecystitis without obstruction: Secondary | ICD-10-CM | POA: Diagnosis not present

## 2015-02-19 DIAGNOSIS — K831 Obstruction of bile duct: Secondary | ICD-10-CM

## 2015-02-19 DIAGNOSIS — Z981 Arthrodesis status: Secondary | ICD-10-CM | POA: Diagnosis not present

## 2015-02-19 DIAGNOSIS — K317 Polyp of stomach and duodenum: Secondary | ICD-10-CM | POA: Diagnosis not present

## 2015-02-19 DIAGNOSIS — K8033 Calculus of bile duct with acute cholangitis with obstruction: Secondary | ICD-10-CM | POA: Diagnosis not present

## 2015-02-19 DIAGNOSIS — K8030 Calculus of bile duct with cholangitis, unspecified, without obstruction: Secondary | ICD-10-CM | POA: Diagnosis not present

## 2015-02-19 DIAGNOSIS — E119 Type 2 diabetes mellitus without complications: Secondary | ICD-10-CM | POA: Diagnosis not present

## 2015-02-19 DIAGNOSIS — K8071 Calculus of gallbladder and bile duct without cholecystitis with obstruction: Secondary | ICD-10-CM | POA: Diagnosis present

## 2015-02-19 DIAGNOSIS — Z86718 Personal history of other venous thrombosis and embolism: Secondary | ICD-10-CM | POA: Diagnosis not present

## 2015-02-19 DIAGNOSIS — K830 Cholangitis: Secondary | ICD-10-CM | POA: Diagnosis not present

## 2015-02-19 DIAGNOSIS — R1011 Right upper quadrant pain: Secondary | ICD-10-CM | POA: Insufficient documentation

## 2015-02-19 DIAGNOSIS — Z6833 Body mass index (BMI) 33.0-33.9, adult: Secondary | ICD-10-CM | POA: Diagnosis not present

## 2015-02-19 DIAGNOSIS — E669 Obesity, unspecified: Secondary | ICD-10-CM | POA: Diagnosis present

## 2015-02-19 DIAGNOSIS — N182 Chronic kidney disease, stage 2 (mild): Secondary | ICD-10-CM

## 2015-02-19 DIAGNOSIS — N179 Acute kidney failure, unspecified: Secondary | ICD-10-CM | POA: Diagnosis present

## 2015-02-19 DIAGNOSIS — K838 Other specified diseases of biliary tract: Secondary | ICD-10-CM | POA: Diagnosis not present

## 2015-02-19 DIAGNOSIS — I10 Essential (primary) hypertension: Secondary | ICD-10-CM | POA: Diagnosis not present

## 2015-02-19 DIAGNOSIS — Z9884 Bariatric surgery status: Secondary | ICD-10-CM | POA: Diagnosis not present

## 2015-02-19 DIAGNOSIS — Z9689 Presence of other specified functional implants: Secondary | ICD-10-CM | POA: Diagnosis not present

## 2015-02-19 LAB — COMPREHENSIVE METABOLIC PANEL
ALT: 129 U/L — AB (ref 17–63)
ANION GAP: 10 (ref 5–15)
AST: 78 U/L — AB (ref 15–41)
Albumin: 2.4 g/dL — ABNORMAL LOW (ref 3.5–5.0)
Alkaline Phosphatase: 228 U/L — ABNORMAL HIGH (ref 38–126)
BUN: 16 mg/dL (ref 6–20)
CALCIUM: 8.2 mg/dL — AB (ref 8.9–10.3)
CO2: 21 mmol/L — ABNORMAL LOW (ref 22–32)
Chloride: 102 mmol/L (ref 101–111)
Creatinine, Ser: 1.71 mg/dL — ABNORMAL HIGH (ref 0.61–1.24)
GFR calc Af Amer: 50 mL/min — ABNORMAL LOW (ref >60)
GFR calc non Af Amer: 43 mL/min — ABNORMAL LOW (ref >60)
Glucose, Bld: 120 mg/dL — ABNORMAL HIGH (ref 65–99)
Potassium: 3.3 mmol/L — ABNORMAL LOW (ref 3.5–5.1)
Sodium: 133 mmol/L — ABNORMAL LOW (ref 135–145)
Total Bilirubin: 5.8 mg/dL — ABNORMAL HIGH (ref 0.3–1.2)
Total Protein: 6.3 g/dL — ABNORMAL LOW (ref 6.5–8.1)

## 2015-02-19 LAB — GLUCOSE, CAPILLARY
Glucose-Capillary: 121 mg/dL — ABNORMAL HIGH (ref 65–99)
Glucose-Capillary: 94 mg/dL (ref 65–99)
Glucose-Capillary: 112 mg/dL — ABNORMAL HIGH (ref 65–99)
Glucose-Capillary: 156 mg/dL — ABNORMAL HIGH (ref 65–99)

## 2015-02-19 LAB — CBC
HCT: 28 % — ABNORMAL LOW (ref 39.0–52.0)
Hemoglobin: 9.4 g/dL — ABNORMAL LOW (ref 13.0–17.0)
MCH: 26.3 pg (ref 26.0–34.0)
MCHC: 33.6 g/dL (ref 30.0–36.0)
MCV: 78.4 fL (ref 78.0–100.0)
PLATELETS: 334 10*3/uL (ref 150–400)
RBC: 3.57 MIL/uL — ABNORMAL LOW (ref 4.22–5.81)
RDW: 15.8 % — ABNORMAL HIGH (ref 11.5–15.5)
WBC: 8.6 10*3/uL (ref 4.0–10.5)

## 2015-02-19 SURGERY — ERCP, WITH INTERVENTION IF INDICATED
Anesthesia: General

## 2015-02-19 MED ORDER — MORPHINE SULFATE 4 MG/ML IJ SOLN: 4.0000 mg | Freq: Once | INTRAMUSCULAR | Status: DC

## 2015-02-19 MED ORDER — POTASSIUM CHLORIDE CRYS ER 20 MEQ PO TBCR
40.0000 meq | EXTENDED_RELEASE_TABLET | Freq: Once | ORAL | Status: AC
  Administered 2015-02-19: 40 meq via ORAL
  Filled 2015-02-19: qty 2

## 2015-02-19 NOTE — Progress Notes (Signed)
[facility] Progress Note    Subjective: Patient doing fine.  LFTs continue to climb including Tbili.  Needs gallbladder removed and CBD exploration somehow.  Objective: Vital signs in last 24 hours: Temp:  [98.3 F (36.8 C)-102.2 F (39 C)] 99.2 F (37.3 C) (05/23 0937) Pulse Rate:  [80-95] 80 (05/23 0937) Resp:  [17-20] 18 (05/23 0937) BP: (123-150)/(56-75) 135/67 mmHg (05/23 0937) SpO2:  [93 %-99 %] 93 % (05/23 0937) Weight:  [113.082 kg (249 lb 4.8 oz)] 113.082 kg (249 lb 4.8 oz) (05/22 2051) Last BM Date: 02/16/15  Intake/Output from previous day: 05/22 0701 - 05/23 0700 In: 720 [P.O.:720] Out: 875 [Urine:875] Intake/Output this shift:    General: No acute distress  Lungs: Clear to auscultation.  Abd: Less tender.  Good bowel sounds.  Extremities: No DVY signs or symptoms  Neuro: Intact  Lab Results:   BMET  Recent Labs  02/18/15 0704 02/19/15 0630  NA 133* 133*  K 3.3* 3.3*  CL 102 102  CO2 22 21*  GLUCOSE 172* 120*  BUN 13 16  CREATININE 1.42* 1.71*  CALCIUM 8.3* 8.2*   PT/INR  Recent Labs  02/17/15 0542  LABPROT 14.9  INR 1.15   ABG No results for input(s): PHART, HCO3 in the last 72 hours.  Invalid input(s): PCO2, PO2  Studies/Results: No results found.  Anti-infectives: Anti-infectives    Start     Dose/Rate Route Frequency Ordered Stop   02/18/15 1000  piperacillin-tazobactam (ZOSYN) IVPB 3.375 g     3.375 g 12.5 mL/hr over 240 Minutes Intravenous Every 8 hours 02/18/15 0901     02/17/15 2000  ciprofloxacin (CIPRO) IVPB 400 mg  Status:  Discontinued     400 mg 200 mL/hr over 60 Minutes Intravenous Every 12 hours 02/17/15 1908 02/18/15 0901      Assessment/Plan: s/p Procedure(s): ENDOSCOPIC RETROGRADE CHOLANGIOPANCREATOGRAPHY (ERCP) Advance diet to clear liquids only   Explained in detail the options for management to this patient:  1.  Laparoscopic Cholecystectomy with Laparoscopic CBD exploration--[facility] and enlisting the assistance of one of my partner who does this procedure; 2.  Laparoscopic Cholecystectomy with laparoscopic access of distal gastric remnant and intraoperative ERCP by GI concomitantly--I have never doe this before and would requrie some assistant from one of my advanced laparoscopy partners; 3.  Transfer to [facility] (where the patient had his gastric bypass procedure) for specialized endoscopy through Roux-en-Y limb with special equipment--Specialist is not available at least until Wednesday, and there is no guarantee that transfer would be approved--[facility] #3 not able to be performed; 4.  Open cholecystectomy with CBD exploration--obviously most invasive, but safe and immediate option.  LOS: 2 days   Marta LamasJames O. Gae BonWyatt, III, MD, FACS [phone]--pager [phone]--office [facility] 02/19/2015

## 2015-02-19 NOTE — Progress Notes (Signed)
RT NOTE:  t has refused cpap at this time./

## 2015-02-19 NOTE — Discharge Summary (Signed)
[facility]  Physician Discharge Summary   Patient ID: Angel Phillips MRN: [id number] DOB/AGE: May 30, 1961 54 y.o.  Admit date: 02/16/2015 Discharge date: 02/19/2015  PCP: [facility], MD  DISCHARGE DIAGNOSES:  Principal Problem:   Abdominal pain Active Problems:   Sleep apnea (presumed)   ETOH abuse   Depression, major   Diabetes mellitus without complication   Chronic kidney disease, stage II (mild)   Hypokalemia   Elevated LFTs   Cholelithiasis without cholecystitis   THIS SUMMARY IS ONLY MEANT TO BE USED IF PATIENT IS TRANSFERRED TO [facility].   INITIAL HISTORY: 54 year old African-American male with a past medical history of hypertension, hyperlipidemia, diabetes, chronic kidney disease stage II with the history of gastric bypass surgery who presented with abdominal pain. He was noted to have abnormal LFTs. CT scan showed cholelithiasis with some intrahepatic biliary ductal dilatation. No cholecystitis was noted. Ultrasound of the abdomen also revealed biliary data patient. Patient was admitted to the hospital for further management. Concern was about possible internal herniation considering his history of gastric bypass surgery. MRCP showed possible stone in the bile duct. His presentation was thought most likely secondary to choledocholithiasis. General surgery and gastroenterology was following closely.  Consultants: Gen. 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 No evidence for acute DVT.  Antibiotics: Ciprofloxacin 5/21--5/22 Zosyn 5/22   HOSPITAL COURSE:   Upper Abdominal pain likely secondary to biliary process Appears to be most likely due to a biliary process. Patient has a history of gastric bypass, which raised the possibility of internal hernia which can be missed by CT scan, but at this time this is not thought to be likely. But patient also has elevated LFTs with the  biliary ductal dilatation noted on CT scan and ultrasound. General surgery is following. Pain control. IV fluids.   Choledocholithiasis with Transaminitis and hyperbilirubinemia  MRCP reveals cholelithiasis without cholecystitis. Choledocholithiasis is also suspected. This is the most likely reason for his presentation. His LFTs have shown some improvement, but bilirubin continues to climb. He continues to spike fever. Ciprofloxacin has been changed over to Zosyn. Hepatitis panel is negative. This is a complicated situation due to his altered anatomy from the gastric bypass. He may not be able to undergo a traditional stone extraction by ERCP. GI and gastroenterology has seen and per Dr. Lindie Spruce (surgeon) the following options are available:  "1. Laparoscopic Cholecystectomy with Laparoscopic CBD exploration--would require likely transfer to [facility] and enlisting the assistance of one of my partner who does this procedure; 2. Laparoscopic Cholecystectomy with laparoscopic access of distal gastric remnant and intraoperative ERCP by GI concomitantly--I have never doe this before and would requrie some assistant from one of my advanced laparoscopy partners; 3. Transfer to [facility] (where the patient had his gastric bypass procedure) for specialized endoscopy through Roux-en-Y limb with special equipment--Specialist is not available at least until Wednesday, and there is no guarantee that transfer would be approved--options 2 & 3 could be done at [facility] also it #3 not able to be performed; 4. Open cholecystectomy with CBD exploration--obviously most invasive, but safe and immediate option."  Discussed with Dr. Rhea Belton with GI. He has spoken to GI in [facility] and they have capability to do this over there using ERCP. Plan is for hospital-to-hospital transfer to [facility]. Patient remains stable and there is no emergent condition at this time.   OSA Continue CPAP  Acute on CKD-II with electrolyte imbalances Baseline  creatinine 1.2. Creatinine had  initially improved but now has climbed once again today. Increased the rate of IV fluids. Avoid nephrotoxic drugs. Repeat labs in the morning. Holding diuretics. Replace electrolytes including potassium and magnesium.  Essential hypertension  Patient was taking lisinopril, chlorthalidone and amlodipine at home. Continue amlodipine. Hold lisinopril and chlorthalidone due to AoCKD-II. IV hydralazine prn  Hyperlipidemia  No LDL was on record. Hold his Zocor for now due to transaminitis.   DM-II CBGs reasonably well controlled. Last A1c 10.8 on 12/11/14. Patient is taking Lantus, glipizide and metformin at home. Continue current dose of Lantus. Continue sliding scale coverage.  Lower extremity swelling, left more than right Venous Doppler showed chronic DVT without any acute process. He underwent a left knee replacement about a month and a half ago. No clear reason to initiate anticoagulation. We will place him on compression stockings. Patient informed me that he had been on coumadin previously for these clots.  Dr. Rhea Belton has contacted [facility] to initiate transfer. Will wait for bed availability at [facility].  PERTINENT LABS:  The results of significant diagnostics from this hospitalization (including imaging, microbiology, ancillary and laboratory) are listed below for reference.     Labs: Basic Metabolic Panel:  Recent Labs Lab 02/16/15 2137 02/17/15 0542 02/18/15 0704 02/19/15 0630  NA 135 136 133* 133*  K 3.1* 3.4* 3.3* 3.3*  CL 99* 102 102 102  CO2 23 25 22  21*  GLUCOSE 120* 122* 172* 120*  BUN 21* 20 13 16   CREATININE 1.34* 1.41* 1.42* 1.71*  CALCIUM 8.9 8.2* 8.3* 8.2*  MG  --  1.4* 1.4*  --    Liver Function Tests:  Recent Labs Lab 02/16/15 2137 02/17/15 0542 02/18/15 0704 02/19/15 0630  AST 435* 429* 137* 78*  ALT 196* 257* 182* 129*  ALKPHOS 303* 273* 279* 228*  BILITOT 1.7* 2.9* 5.4* 5.8*  PROT 7.5 6.1* 6.4* 6.3*  ALBUMIN 3.3* 2.6*  2.8* 2.4*    Recent Labs Lab 02/16/15 2137  LIPASE 38   CBC:  Recent Labs Lab 02/16/15 2137 02/17/15 0542 02/18/15 0704 02/19/15 0630  WBC 9.9 12.0* 11.6* 8.6  NEUTROABS 7.9*  --   --   --   HGB 11.4* 10.0* 10.1* 9.4*  HCT 34.8* 29.8* 31.2* 28.0*  MCV 78.6 78.6 78.8 78.4  PLT 576* 478* 431* 334   CBG:  Recent Labs Lab 02/18/15 1631 02/18/15 2055 02/19/15 0740 02/19/15 1127 02/19/15 1616  GLUCAP 163* 233* 121* 94 112*     IMAGING STUDIES Ct Abdomen Pelvis W Contrast  02/17/2015   CLINICAL DATA:  Gastric bypass. Abdominal pain with nausea and vomiting after eating a bagel.  EXAM: CT ABDOMEN AND PELVIS WITH CONTRAST  TECHNIQUE: Multidetector CT imaging of the abdomen and pelvis was performed using the standard protocol following bolus administration of intravenous contrast.  CONTRAST:  OMNIPAQUE IOHEXOL 300 MG/ML  SOLN  COMPARISON:  None.  FINDINGS: BODY WALL: No contributory findings.  LOWER CHEST: Minimal retained oral contrast in lower esophagus, but no indication of obstructive food bolus. Probable tiny hiatal hernia.  ABDOMEN/PELVIS:  Liver: No focal abnormality.  Biliary: Mild intrahepatic biliary duct dilation. There is no focal narrowing of the biliary tree. Large calcified gallstone, without inflammatory change. A smoothly contoured ovoid nodule along the anterior/upper wall of the gallbladder is likely a small lobule of liver given isoenhancement and discrete inner gallbladder wall.  Pancreas: Unremarkable.  Spleen: Unremarkable.  Adrenals: 1 cm nodule in the right adrenal gland is presumably a adenoma, although not  specific on postcontrast only imaging.  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 There is symmetric mild fullness of the upper collecting system. No ureteral obstruction or stone identified. Small bilateral presumed cysts. There is a simple 17 mm cyst exophytic from the left lower pole   Bladder: Unremarkable.  Reproductive: Penile pump with reservoir on the right.  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 Normal appendix. Distal colonic diverticulosis.  Retroperitoneum: No mass or adenopathy.  Peritoneum: No ascites or pneumoperitoneum.  Vascular: Linear calcification in the left common femoral vein likely from remote venous access or remote thrombosis.  OSSEOUS: Posterior spinal fixation from T10-L3 with complete posterior element bony fusion. 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 Canal stenosis also present at L3-4, mainly from posterior element overgrowth and hardware. There is advanced adjacent level disc and facet degeneration with advanced bilateral foraminal stenosis at L3-4 and L4-5.  IMPRESSION: 1. Cholelithiasis without evidence of acute cholecystitis. 2. Mild intrahepatic biliary dilatation without visible cause. Correlate with liver function tests. 3. Gastric bypass without complication. 4. Incidental findings noted above.   Electronically Signed   By: Marnee Spring M.D.   On: 02/17/2015 01:01   Mr 3d Recon At Scanner  02/17/2015   CLINICAL DATA:  Right upper quadrant/epigastric abdominal pain, nausea/vomiting. Cholelithiasis. Mild intrahepatic ductal dilatation.  EXAM: MRI ABDOMEN WITHOUT AND WITH CONTRAST (INCLUDING MRCP)  TECHNIQUE: Multiplanar multisequence MR imaging of the abdomen was performed both before and after the administration of intravenous contrast. Heavily T2-weighted images of the biliary and pancreatic ducts were obtained, and three-dimensional MRCP images were rendered by post processing.  CONTRAST:  20mL MULTIHANCE GADOBENATE DIMEGLUMINE 529 MG/ML IV SOLN  COMPARISON:  CT abdomen pelvis and abdominal ultrasound dated 02/17/2015.  FINDINGS: Lower chest:  Lung bases are clear.  Hepatobiliary: Liver is notable for mild hepatic steatosis. No  suspicious/ enhancing hepatic lesions.  2.7 cm gallstone. No gallbladder wall thickening or pericholecystic fluid.  No intrahepatic ductal dilatation. Common duct is mildly prominent, measuring 7 mm. Suspected 5 mm distal CBD stone (series 9/image 65).  Pancreas: Within normal limits.  Spleen: Within normal limits.  Adrenals/Urinary Tract: 12 mm right adrenal cyst (series 5/ image 27).  Left adrenal gland is within normal limits.  Scattered small bilateral renal cysts measuring up to 10 mm (series 5/ image 44). No enhancing renal lesions. No hydronephrosis.  Stomach/Bowel: Postsurgical changes related to prior gastric bypass.  Visualized bowel is otherwise unremarkable.  Vascular/Lymphatic: No evidence abdominal aortic aneurysm.  No suspicious abdominal lymphadenopathy.  Other: No abdominal ascites.  Musculoskeletal: Postsurgical changes/susceptibility artifact related to lumbar spine fixation.  IMPRESSION: Cholelithiasis, without associated MR findings to suggest acute cholecystitis.  Common duct is mildly prominent, measuring 7 mm. Choledocholithiasis with suspected 5 mm distal CBD stone. Consider ERCP.  Additional ancillary findings as above.  These results will be called to the ordering clinician or representative by the Radiologist Assistant, and communication documented in the PACS or zVision Dashboard.   Electronically Signed   By: Charline Bills M.D.   On: 02/17/2015 13:21   US Abdomen Limited  02/17/2015   CLINICAL DATA:  Abdominal pain, elevated LFTs, large gallstone.  EXAM: US ABDOMEN LIMITED - RIGHT UPPER QUADRANT  COMPARISON:  CT earlier this day.  FINDINGS: Gallbladder:  Physiologically distended and contains a shadowing 2.4 cm gallstone. No gallbladder wall thickening, wall thickness of 2 mm.  Sonographic Eulah PontMurphy sign is negative.  Common bile duct:  Diameter: 10 mm at the porta hepatis. Distal common bile duct obscured.  Liver:  No focal lesion identified. Diffusely increased and heterogeneous in  parenchymal echogenicity.  IMPRESSION: 1. Cholelithiasis without secondary findings of acute cholecystitis. 2. Biliary dilatation, common bile duct of 10 mm. Cause of biliary dilatation is not delineated, MRCP could be considered for further evaluation if patient is able tolerate breath hold technique. 3. Hepatic steatosis.   Electronically Signed   By: Rubye OaksMelanie  Ehinger M.D.   On: 02/17/2015 02:29   Mr Abd W/wo Cm/mrcp  02/17/2015   CLINICAL DATA:  Right upper quadrant/epigastric abdominal pain, nausea/vomiting. Cholelithiasis. Mild intrahepatic ductal dilatation.  EXAM: MRI ABDOMEN WITHOUT AND WITH CONTRAST (INCLUDING MRCP)  TECHNIQUE: Multiplanar multisequence MR imaging of the abdomen was performed both before and after the administration of intravenous contrast. Heavily T2-weighted images of the biliary and pancreatic ducts were obtained, and three-dimensional MRCP images were rendered by post processing.  CONTRAST:  20mL MULTIHANCE GADOBENATE DIMEGLUMINE 529 MG/ML IV SOLN  COMPARISON:  CT abdomen pelvis and abdominal ultrasound dated 02/17/2015.  FINDINGS: Lower chest:  Lung bases are clear.  Hepatobiliary: Liver is notable for mild hepatic steatosis. No suspicious/ enhancing hepatic lesions.  2.7 cm gallstone. No gallbladder wall thickening or pericholecystic fluid.  No intrahepatic ductal dilatation. Common duct is mildly prominent, measuring 7 mm. Suspected 5 mm distal CBD stone (series 9/image 65).  Pancreas: Within normal limits.  Spleen: Within normal limits.  Adrenals/Urinary Tract: 12 mm right adrenal cyst (series 5/ image 27).  Left adrenal gland is within normal limits.  Scattered small bilateral renal cysts measuring up to 10 mm (series 5/ image 44). No enhancing renal lesions. No hydronephrosis.  Stomach/Bowel: Postsurgical changes related to prior gastric bypass.  Visualized bowel is otherwise unremarkable.  Vascular/Lymphatic: No evidence abdominal aortic aneurysm.  No suspicious abdominal  lymphadenopathy.  Other: No abdominal ascites.  Musculoskeletal: Postsurgical changes/susceptibility artifact related to lumbar spine fixation.  IMPRESSION: Cholelithiasis, without associated MR findings to suggest acute cholecystitis.  Common duct is mildly prominent, measuring 7 mm. Choledocholithiasis with suspected 5 mm distal CBD stone. Consider ERCP.  Additional ancillary findings as above.  These results will be called to the ordering clinician or representative by the Radiologist Assistant, and communication documented in the PACS or zVision Dashboard.   Electronically Signed   By: Charline BillsSriyesh  Jaliyah Fotheringham M.D.   On: 02/17/2015 13:21    ALLERGIES:  Allergies  Allergen Reactions  . Oxycodone Itching and Other (See Comments)    "Makes me feel Bad"    Current Inpatient Medications:  Scheduled: . amLODipine  10 mg Oral Daily  . gabapentin  600 mg Oral BID  . heparin  5,000 Units Subcutaneous 3 times per day  . insulin aspart  0-9 Units Subcutaneous TID WC  . insulin glargine  7 Units Subcutaneous Daily  . lidocaine  1 patch Transdermal Q24H  . piperacillin-tazobactam (ZOSYN)  IV  3.375 g Intravenous Q8H   Continuous: . sodium chloride 125 mL/hr at 02/19/15 1509   ZOX:[id number]:acetaminophen, albuterol, guaiFENesin-dextromethorphan, hydrALAZINE, morphine injection, ondansetron Jarold Motto(ZOFRAN) IV   Methodist Specialty [facility] Transplant HospitalKRISHNAN,Gearline Spilman  [facility] Pager [phone]  02/19/2015, 6:29 PM

## 2015-02-19 NOTE — Progress Notes (Signed)
[facility] One time order for 4mg  of Morphine given for pain before transport.

## 2015-02-19 NOTE — Progress Notes (Signed)
Daily Rounding Note  02/19/2015, 11:47 AM  LOS: 2 days   SUBJECTIVE:       LFTs, including T bili, continued to rise. Fever to 102.2 this morning.  Pain comtrolled with meds.  Ongoing dry heaves but he is hungry and clears ordered for lunch  Reviewed Dr. Nobie Putnam general surgery, note. His suggestions include: * transfer the patient to [facility] for specialized endoscopy through the Roux-en-Y limb. However the specialist who could perform this procedure is not available until Wednesday and no guarantees that the transfer would be approved.   *  laparoscopic cholecystectomy with laparoscopic exploration of the common bile duct. This would require transfer to [facility] to be performed by a surgeon with experience performing this. * laparoscopic cholecystectomy with laparoscopic access to the distal gastric remnant and intraoperative ERCP by GI. * open cholecystectomy with CBD exploration, the most invasive but a safe and immediate option.   OBJECTIVE:         Vital signs in last 24 hours:    Temp:  [98.3 F (36.8 C)-102.2 F (39 C)] 99.2 F (37.3 C) (05/23 0937) Pulse Rate:  [80-86] 80 (05/23 0937) Resp:  [17-20] 18 (05/23 0937) BP: (123-150)/(56-75) 135/67 mmHg (05/23 0937) SpO2:  [93 %-99 %] 93 % (05/23 0937) Weight:  [249 lb 4.8 oz (113.082 kg)] 249 lb 4.8 oz (113.082 kg) (05/22 2051) Last BM Date: 02/16/15 Filed Weights   02/17/15 0450 02/18/15 2051  Weight: 248 lb 14.4 oz (112.9 kg) 249 lb 4.8 oz (113.082 kg)   General: looks well, comfortable.  obese   Heart: RRR Chest: clear bil Abdomen: obese, soft, diffusely tender.  BS active.   Extremities: no CCE Neuro/Psych:  Pleasant, calm, no gross deficits or weakness.   Intake/Output from previous day: 05/22 0701 - 05/23 0700 In: 720 [P.O.:720] Out: 875 [Urine:875]  Intake/Output this shift: Total I/O In: 0  Out: 150 [Urine:150]  Lab Results:  Recent Labs  02/17/15 0542 02/18/15 0704 02/19/15 0630  WBC 12.0* 11.6* 8.6  HGB 10.0* 10.1* 9.4*  HCT 29.8* 31.2* 28.0*  PLT 478* 431* 334   BMET  Recent Labs  02/17/15 0542 02/18/15 0704 02/19/15 0630  NA 136 133* 133*  K 3.4* 3.3* 3.3*  CL 102 102 102  CO2 25 22 21*  GLUCOSE 122* 172* 120*  BUN CREATININE 1.41* 1.42* 1.71*  CALCIUM 8.2* 8.3* 8.2*   LFT  Recent Labs  02/17/15 0542 02/18/15 0704 02/19/15 0630  PROT 6.1* 6.4* 6.3*  ALBUMIN 2.6* 2.8* 2.4*  AST 429* 137* 78*  ALT 257* 182* 129*  ALKPHOS 273* 279* 228*  BILITOT 2.9* 5.4* 5.8*   PT/INR  Recent Labs  02/17/15 0542  LABPROT 14.9  INR 1.15   Hepatitis Panel  Recent Labs  02/17/15 0120  HEPBSAG NEGATIVE  HCVAB NEGATIVE  HEPAIGM NON REACTIVE  HEPBIGM NON REACTIVE    Studies/Results: No results found.  Scheduled Meds: . amLODipine  10 mg Oral Daily  . gabapentin  600 mg Oral BID  . heparin  5,000 Units Subcutaneous 3 times per day  . insulin aspart  0-9 Units Subcutaneous TID WC  . insulin glargine  7 Units Subcutaneous Daily  . lidocaine  1 patch Transdermal Q24H  . piperacillin-tazobactam (ZOSYN)  IV  3.375 g Intravenous Q8H   Continuous Infusions: . sodium chloride 100 mL/hr at 02/18/15 1955   PRN Meds:.acetaminophen, albuterol, guaiFENesin-dextromethorphan, hydrALAZINE, morphine injection, ondansetron (  ZOFRAN) IV   ASSESMENT:   *  Choledocholithiasis.  ERCP limited by the fact that the patient is status post bariatric Roux-en-Y gastric bypass  *  Acute cholecystitis.  *  Acute kidney injury: rising creatinine.   *  IDDM, type 2.    PLAN   *  At this point patient has some different options. The surgical service and GI/ERCP service need to come to a consensus on which of these options to pursue. In the meantime patient is receiving Zosyn. Prior to any intervention the subcutaneous heparin will need to be discontinued.  *  Agree with adding clears.     Jennye MoccasinSarah Araseli Sherry   02/19/2015, 11:47 AM Pager: [phone]

## 2015-02-19 NOTE — Progress Notes (Signed)
[facility] PROGRESS NOTE  Angel Phillips ZOX:[id number] DOB: 1961/04/03 DOA: 02/16/2015  PCP: Eula ListenMCKINLEY,DOMINIC, MD  Brief HPI: 54 year old African-American male with a past medical history of hypertension, hyperlipidemia, diabetes, chronic kidney disease stage II with the history of gastric bypass surgery who presented with abdominal pain. He was noted to have abnormal LFTs. CT scan showed cholelithiasis with some intrahepatic biliary ductal dilatation. No cholecystitis was noted. Ultrasound of the abdomen also revealed biliary data patient. Patient was admitted to the hospital for further management. Concern was about possible internal herniation considering his history of gastric bypass surgery. MRCP showed possible stone in the bile duct. His presentation was thought most likely secondary to choledocholithiasis. General surgery and gastroenterology is following closely.  Past medical history:  Past Medical History  Diagnosis Date  . Hypertension   . Diabetes mellitus without complication   . Arthritis   . Tuberculosis     tb positive 1989 ?   Marland Kitchen. Depression   . PONV (postoperative nausea and vomiting)   . Chronic kidney disease 02/2013    ACUTE RENAL FAILURE  . HLD (hyperlipidemia)   . OSA (obstructive sleep apnea)   . Alcohol abuse     Consultants: Gen. 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 No evidence for acute DVT.  Antibiotics: Ciprofloxacin 5/21--5/22 Zosyn 5/22  Subjective: Patient continues to require pain medications every 3 hours. Continues to have nausea. Is also hungry.    Objective: Vital Signs  Filed Vitals:   02/18/15 1656 02/18/15 2051 02/19/15 0532 02/19/15 0658  BP: 123/56 136/72 150/75   Pulse: 81 81 86   Temp: 98.8 F (37.1 C) 98.3 F (36.8 C) 101 F (38.3 C) 98.8 F (37.1 C)  TempSrc: Oral Oral Oral Oral  Resp: 17 18 20    Height:      Weight:  113.082 kg  (249 lb 4.8 oz)    SpO2: 93% 99% 97%     Intake/Output Summary (Last 24 hours) at 02/19/15 0908 Last data filed at 02/19/15 0658  Gross per 24 hour  Intake    720 ml  Output    875 ml  Net   -155 ml   Filed Weights   02/17/15 0450 02/18/15 2051  Weight: 112.9 kg (248 lb 14.4 oz) 113.082 kg (249 lb 4.8 oz)    General appearance: alert, cooperative, and no distress Resp: clear to auscultation bilaterally Cardio: regular rate and rhythm, S1, S2 normal, no murmur, click, rub or gallop GI: Abdomen is soft. tenderness in the upper abdomen in the epigastric and right upper quadrant without any rebound, rigidity or guarding. No masses or organomegaly. Bowel sounds are present. Extremities: At initial assessment He was noted to have swelling of the left lower extremity more than the right. According to the patient this is chronic. He has had surgeries done to that extremity.  Neurologic: No focal deficits  Lab Results:  Basic Metabolic Panel:  Recent Labs Lab 02/16/15 2137 02/17/15 0542 02/18/15 0704 02/19/15 0630  NA 135 136 133* 133*  K 3.1* 3.4* 3.3* 3.3*  CL 99* 102 102 102  CO2 23 25 22  21*  GLUCOSE 120* 122* 172* 120*  BUN 21* 20 13 16   CREATININE 1.34* 1.41* 1.42* 1.71*  CALCIUM 8.9 8.2* 8.3* 8.2*  MG  --  1.4* 1.4*  --    Liver Function Tests:  Recent Labs Lab 02/16/15 2137 02/17/15 0542 02/18/15 0704 02/19/15 0630  AST 435*  429* 137* 78*  ALT 196* 257* 182* 129*  ALKPHOS 303* 273* 279* 228*  BILITOT 1.7* 2.9* 5.4* 5.8*  PROT 7.5 6.1* 6.4* 6.3*  ALBUMIN 3.3* 2.6* 2.8* 2.4*    Recent Labs Lab 02/16/15 2137  LIPASE 38   CBC:  Recent Labs Lab 02/16/15 2137 02/17/15 0542 02/18/15 0704 02/19/15 0630  WBC 9.9 12.0* 11.6* 8.6  NEUTROABS 7.9*  --   --   --   HGB 11.4* 10.0* 10.1* 9.4*  HCT 34.8* 29.8* 31.2* 28.0*  MCV 78.6 78.6 78.8 78.4  PLT 576* 478* 431* 334   CBG:  Recent Labs Lab 02/18/15 0740 02/18/15 1117 02/18/15 1631 02/18/15 2055  02/19/15 0740  GLUCAP 165* 201* 163* 233* 121*     Studies/Results: No results found.  Medications:  Scheduled: . amLODipine  10 mg Oral Daily  . gabapentin  600 mg Oral BID  . heparin  5,000 Units Subcutaneous 3 times per day  . insulin aspart  0-9 Units Subcutaneous TID WC  . insulin glargine  7 Units Subcutaneous Daily  . lidocaine  1 patch Transdermal Q24H  . piperacillin-tazobactam (ZOSYN)  IV  3.375 g Intravenous Q8H   Continuous: . sodium chloride 100 mL/hr at 02/18/15 1955   ZOX:[id number], albuterol, guaiFENesin-dextromethorphan, hydrALAZINE, morphine injection, ondansetron (ZOFRAN) IV  Assessment/Plan:  Principal Problem:   Abdominal pain Active Problems:   Sleep apnea (presumed)   ETOH abuse   Depression, major   Diabetes mellitus without complication   Chronic kidney disease, stage II (mild)   Hypokalemia   Elevated LFTs   Cholelithiasis without cholecystitis    Upper Abdominal pain likely secondary to biliary process Appears to be most likely due to a biliary process. Patient has a history of gastric bypass, which raised the possibility of internal hernia which can be missed by CT scan, but at this time this is not thought to be likely. But patient also has elevated LFTs with the biliary ductal dilatation noted on CT scan and ultrasound. General surgery is following. Pain control. IV fluids.   Choledocholithiasis with Transaminitis and hyperbilirubinemia  MRCP reveals cholelithiasis without cholecystitis. Choledocholithiasis is also suspected. This is the most likely reason for his presentation. His LFTs have shown some improvement, but bilirubin continues to climb. He continues to spike fever. Ciprofloxacin has been changed over to Zosyn. Hepatitis panel is negative. This is a complicated situation due to his altered anatomy from the gastric bypass. He may not be able to undergo a traditional stone extraction by ERCP. GI and gastroenterology to address  this further.    OSA Continue CPAP  Acute on CKD-II with electrolyte imbalances Baseline creatinine 1.2. Creatinine had initially improved but now has climbed once again. We will increase the rate of IV fluids. Avoid nephrotoxic drugs. Repeat labs in the morning. Holding diuretics. Replace electrolytes including potassium and magnesium.  Essential hypertension  Patient was taking lisinopril, chlorthalidone and amlodipine at home. Continue amlodipine. Hold lisinopril and chlorthalidone due to AoCKD-II. IV hydralazine prn  Hyperlipidemia  No LDL was on record. Hold his Zocor for now due to transaminitis.   DM-II CBGs reasonably well controlled. Last A1c 10.8 on 12/11/14. Patient is taking Lantus, glipizide and metformin at home. Continue current dose of Lantus. Continue sliding scale coverage.  Lower extremity swelling, left more than right Venous Doppler showed chronic DVT without any acute process. He underwent a left knee replacement about a month and a half ago. No clear reason to initiate anticoagulation. We will place  him on compression stockings. Patient informed me that he had been on coumadin previously for these clots.  DVT Prophylaxis: Subcutaneous heparin    Code Status: Full code  Family Communication: Discussed with the patient  Disposition Plan: Surgery and GI to confer. Not ready for discharge   LOS: 2 days   [facility]  [facility] Pager [phone] 02/19/2015, 9:08 AM  If 7PM-7AM, please contact night-coverage at [email or web address], password [facility]

## 2015-02-20 DIAGNOSIS — Z9884 Bariatric surgery status: Secondary | ICD-10-CM | POA: Diagnosis not present

## 2015-02-20 DIAGNOSIS — K317 Polyp of stomach and duodenum: Secondary | ICD-10-CM | POA: Diagnosis not present

## 2015-02-20 DIAGNOSIS — K8050 Calculus of bile duct without cholangitis or cholecystitis without obstruction: Secondary | ICD-10-CM | POA: Diagnosis not present

## 2015-02-21 ENCOUNTER — Telehealth: Payer: Self-pay | Admitting: Internal Medicine

## 2015-02-21 NOTE — Telephone Encounter (Signed)
Encounter opened for continuum of care.  [facility]  [facility]

## 2015-02-28 DIAGNOSIS — K8020 Calculus of gallbladder without cholecystitis without obstruction: Secondary | ICD-10-CM | POA: Diagnosis not present

## 2015-02-28 DIAGNOSIS — Z4659 Encounter for fitting and adjustment of other gastrointestinal appliance and device: Secondary | ICD-10-CM | POA: Diagnosis not present

## 2015-02-28 DIAGNOSIS — K8050 Calculus of bile duct without cholangitis or cholecystitis without obstruction: Secondary | ICD-10-CM | POA: Diagnosis not present

## 2015-02-28 DIAGNOSIS — K838 Other specified diseases of biliary tract: Secondary | ICD-10-CM | POA: Diagnosis not present

## 2015-06-13 DIAGNOSIS — M25761 Osteophyte, right knee: Secondary | ICD-10-CM | POA: Diagnosis not present

## 2015-06-13 DIAGNOSIS — M25561 Pain in right knee: Secondary | ICD-10-CM | POA: Diagnosis not present

## 2015-06-13 DIAGNOSIS — M25462 Effusion, left knee: Secondary | ICD-10-CM | POA: Diagnosis not present

## 2015-06-13 DIAGNOSIS — Z96652 Presence of left artificial knee joint: Secondary | ICD-10-CM | POA: Diagnosis not present

## 2015-06-13 DIAGNOSIS — M179 Osteoarthritis of knee, unspecified: Secondary | ICD-10-CM | POA: Diagnosis not present

## 2015-06-13 DIAGNOSIS — T8484XA Pain due to internal orthopedic prosthetic devices, implants and grafts, initial encounter: Secondary | ICD-10-CM | POA: Diagnosis not present

## 2015-06-13 DIAGNOSIS — M1711 Unilateral primary osteoarthritis, right knee: Secondary | ICD-10-CM | POA: Diagnosis not present

## 2015-06-26 DIAGNOSIS — M1990 Unspecified osteoarthritis, unspecified site: Secondary | ICD-10-CM | POA: Diagnosis not present

## 2015-06-26 DIAGNOSIS — Z96652 Presence of left artificial knee joint: Secondary | ICD-10-CM | POA: Diagnosis not present

## 2016-01-24 ENCOUNTER — Emergency Department (HOSPITAL_COMMUNITY): Payer: Non-veteran care

## 2016-01-24 ENCOUNTER — Encounter (HOSPITAL_COMMUNITY): Payer: Self-pay | Admitting: Emergency Medicine

## 2016-01-24 ENCOUNTER — Emergency Department (HOSPITAL_COMMUNITY)
Admission: EM | Admit: 2016-01-24 | Discharge: 2016-01-24 | Disposition: A | Discharge status: Home / Self Care | Payer: Non-veteran care | Attending: Emergency Medicine | Admitting: Emergency Medicine

## 2016-01-24 DIAGNOSIS — Z7901 Long term (current) use of anticoagulants: Secondary | ICD-10-CM | POA: Insufficient documentation

## 2016-01-24 DIAGNOSIS — Z794 Long term (current) use of insulin: Secondary | ICD-10-CM | POA: Insufficient documentation

## 2016-01-24 DIAGNOSIS — Y92009 Unspecified place in unspecified non-institutional (private) residence as the place of occurrence of the external cause: Secondary | ICD-10-CM | POA: Diagnosis not present

## 2016-01-24 DIAGNOSIS — I129 Hypertensive chronic kidney disease with stage 1 through stage 4 chronic kidney disease, or unspecified chronic kidney disease: Secondary | ICD-10-CM | POA: Insufficient documentation

## 2016-01-24 DIAGNOSIS — R262 Difficulty in walking, not elsewhere classified: Secondary | ICD-10-CM

## 2016-01-24 DIAGNOSIS — Y939 Activity, unspecified: Secondary | ICD-10-CM | POA: Insufficient documentation

## 2016-01-24 DIAGNOSIS — Y999 Unspecified external cause status: Secondary | ICD-10-CM | POA: Diagnosis not present

## 2016-01-24 DIAGNOSIS — R339 Retention of urine, unspecified: Secondary | ICD-10-CM | POA: Diagnosis not present

## 2016-01-24 DIAGNOSIS — N189 Chronic kidney disease, unspecified: Secondary | ICD-10-CM | POA: Diagnosis not present

## 2016-01-24 DIAGNOSIS — Z79899 Other long term (current) drug therapy: Secondary | ICD-10-CM | POA: Diagnosis not present

## 2016-01-24 DIAGNOSIS — F329 Major depressive disorder, single episode, unspecified: Secondary | ICD-10-CM | POA: Diagnosis not present

## 2016-01-24 DIAGNOSIS — F419 Anxiety disorder, unspecified: Secondary | ICD-10-CM | POA: Diagnosis present

## 2016-01-24 DIAGNOSIS — E785 Hyperlipidemia, unspecified: Secondary | ICD-10-CM | POA: Diagnosis not present

## 2016-01-24 DIAGNOSIS — E876 Hypokalemia: Secondary | ICD-10-CM | POA: Diagnosis not present

## 2016-01-24 DIAGNOSIS — M545 Low back pain, unspecified: Secondary | ICD-10-CM

## 2016-01-24 DIAGNOSIS — Z96652 Presence of left artificial knee joint: Secondary | ICD-10-CM | POA: Diagnosis not present

## 2016-01-24 DIAGNOSIS — Z7984 Long term (current) use of oral hypoglycemic drugs: Secondary | ICD-10-CM | POA: Diagnosis not present

## 2016-01-24 DIAGNOSIS — F10920 Alcohol use, unspecified with intoxication, uncomplicated: Secondary | ICD-10-CM

## 2016-01-24 DIAGNOSIS — T7411XA Adult physical abuse, confirmed, initial encounter: Secondary | ICD-10-CM | POA: Insufficient documentation

## 2016-01-24 DIAGNOSIS — Z79891 Long term (current) use of opiate analgesic: Secondary | ICD-10-CM | POA: Diagnosis not present

## 2016-01-24 DIAGNOSIS — E1122 Type 2 diabetes mellitus with diabetic chronic kidney disease: Secondary | ICD-10-CM | POA: Diagnosis not present

## 2016-01-24 DIAGNOSIS — M1990 Unspecified osteoarthritis, unspecified site: Secondary | ICD-10-CM | POA: Diagnosis not present

## 2016-01-24 DIAGNOSIS — F10120 Alcohol abuse with intoxication, uncomplicated: Secondary | ICD-10-CM | POA: Diagnosis not present

## 2016-01-24 DIAGNOSIS — R269 Unspecified abnormalities of gait and mobility: Secondary | ICD-10-CM | POA: Diagnosis not present

## 2016-01-24 DIAGNOSIS — G4733 Obstructive sleep apnea (adult) (pediatric): Secondary | ICD-10-CM | POA: Insufficient documentation

## 2016-01-24 LAB — URINALYSIS, ROUTINE W REFLEX MICROSCOPIC
BILIRUBIN URINE: NEGATIVE
GLUCOSE, UA: NEGATIVE mg/dL
HGB URINE DIPSTICK: NEGATIVE
Ketones, ur: NEGATIVE mg/dL
Leukocytes, UA: NEGATIVE
Nitrite: NEGATIVE
PH: 6.5 (ref 5.0–8.0)
Protein, ur: NEGATIVE mg/dL
SPECIFIC GRAVITY, URINE: 1.008 (ref 1.005–1.030)

## 2016-01-24 LAB — COMPREHENSIVE METABOLIC PANEL
ALK PHOS: 87 U/L (ref 38–126)
ALT: 18 U/L (ref 17–63)
AST: 23 U/L (ref 15–41)
Albumin: 3.2 g/dL — ABNORMAL LOW (ref 3.5–5.0)
Anion gap: 14 (ref 5–15)
BUN: 15 mg/dL (ref 6–20)
CALCIUM: 8.8 mg/dL — AB (ref 8.9–10.3)
CHLORIDE: 96 mmol/L — AB (ref 101–111)
CO2: 28 mmol/L (ref 22–32)
CREATININE: 1.59 mg/dL — AB (ref 0.61–1.24)
GFR calc non Af Amer: 47 mL/min — ABNORMAL LOW (ref >60)
GFR, EST AFRICAN AMERICAN: 54 mL/min — AB (ref >60)
GLUCOSE: 144 mg/dL — AB (ref 65–99)
Potassium: 2.8 mmol/L — ABNORMAL LOW (ref 3.5–5.1)
SODIUM: 138 mmol/L (ref 135–145)
Total Bilirubin: 0.7 mg/dL (ref 0.3–1.2)
Total Protein: 6.4 g/dL — ABNORMAL LOW (ref 6.5–8.1)

## 2016-01-24 LAB — MAGNESIUM: Magnesium: 1.7 mg/dL (ref 1.7–2.4)

## 2016-01-24 LAB — RAPID URINE DRUG SCREEN, HOSP PERFORMED
Amphetamines: NOT DETECTED
BENZODIAZEPINES: NOT DETECTED
Barbiturates: NOT DETECTED
Cocaine: POSITIVE — AB
Opiates: NOT DETECTED
Tetrahydrocannabinol: NOT DETECTED

## 2016-01-24 LAB — CBC WITH DIFFERENTIAL/PLATELET
BASOS ABS: 0 10*3/uL (ref 0.0–0.1)
Basophils Relative: 0 %
EOS ABS: 0.1 10*3/uL (ref 0.0–0.7)
EOS PCT: 2 %
HCT: 40.3 % (ref 39.0–52.0)
Hemoglobin: 13.8 g/dL (ref 13.0–17.0)
Lymphocytes Relative: 36 %
Lymphs Abs: 1.6 10*3/uL (ref 0.7–4.0)
MCH: 27.8 pg (ref 26.0–34.0)
MCHC: 34.2 g/dL (ref 30.0–36.0)
MCV: 81.3 fL (ref 78.0–100.0)
Monocytes Absolute: 0.4 10*3/uL (ref 0.1–1.0)
Monocytes Relative: 8 %
Neutro Abs: 2.5 10*3/uL (ref 1.7–7.7)
Neutrophils Relative %: 54 %
PLATELETS: 307 10*3/uL (ref 150–400)
RBC: 4.96 MIL/uL (ref 4.22–5.81)
RDW: 14.4 % (ref 11.5–15.5)
WBC: 4.6 10*3/uL (ref 4.0–10.5)

## 2016-01-24 LAB — I-STAT CHEM 8, ED
BUN: 16 mg/dL (ref 6–20)
CALCIUM ION: 1.05 mmol/L — AB (ref 1.12–1.23)
CHLORIDE: 93 mmol/L — AB (ref 101–111)
Creatinine, Ser: 1.5 mg/dL — ABNORMAL HIGH (ref 0.61–1.24)
Glucose, Bld: 144 mg/dL — ABNORMAL HIGH (ref 65–99)
HCT: 44 % (ref 39.0–52.0)
Hemoglobin: 15 g/dL (ref 13.0–17.0)
POTASSIUM: 2.8 mmol/L — AB (ref 3.5–5.1)
SODIUM: 136 mmol/L (ref 135–145)
TCO2: 28 mmol/L (ref 0–100)

## 2016-01-24 LAB — ETHANOL

## 2016-01-24 LAB — CK: CK TOTAL: 129 U/L (ref 49–397)

## 2016-01-24 MED ORDER — POTASSIUM CHLORIDE 10 MEQ/100ML IV SOLN
10.0000 meq | Freq: Once | INTRAVENOUS | Status: AC
  Administered 2016-01-24: 10 meq via INTRAVENOUS

## 2016-01-24 MED ORDER — ALPRAZOLAM 0.5 MG PO TABS: 0.5000 mg | ORAL_TABLET | Freq: Once | ORAL | Status: DC

## 2016-01-24 MED ORDER — ALPRAZOLAM 0.5 MG PO TABS
0.5000 mg | ORAL_TABLET | Freq: Once | ORAL | Status: AC
  Administered 2016-01-24: 0.5 mg via ORAL
  Filled 2016-01-24: qty 1

## 2016-01-24 MED ORDER — POTASSIUM CHLORIDE CRYS ER 20 MEQ PO TBCR
40.0000 meq | EXTENDED_RELEASE_TABLET | Freq: Once | ORAL | Status: AC
  Administered 2016-01-24: 40 meq via ORAL
  Filled 2016-01-24: qty 2

## 2016-01-24 MED ORDER — DIAZEPAM 5 MG/ML IJ SOLN
2.5000 mg | Freq: Once | INTRAMUSCULAR | Status: AC
  Administered 2016-01-24: 2.5 mg via INTRAVENOUS
  Filled 2016-01-24: qty 2

## 2016-01-24 MED ORDER — IOPAMIDOL (ISOVUE-370) INJECTION 76%
100.0000 mL | Freq: Once | INTRAVENOUS | Status: AC | PRN
  Administered 2016-01-24: 100 mL via INTRAVENOUS

## 2016-01-24 MED ORDER — POTASSIUM CHLORIDE CRYS ER 20 MEQ PO TBCR: 20.0000 meq | EXTENDED_RELEASE_TABLET | Freq: Two times a day (BID) | ORAL | Status: DC

## 2016-01-24 MED ORDER — TAMSULOSIN HCL 0.4 MG PO CAPS: 0.4000 mg | ORAL_CAPSULE | Freq: Every day | ORAL | Status: AC

## 2016-01-24 MED ORDER — SODIUM CHLORIDE 0.9 % IV BOLUS (SEPSIS)
1000.0000 mL | Freq: Once | INTRAVENOUS | Status: AC
  Administered 2016-01-24: 1000 mL via INTRAVENOUS

## 2016-01-24 MED ORDER — MORPHINE SULFATE (PF) 4 MG/ML IV SOLN
4.0000 mg | Freq: Once | INTRAVENOUS | Status: AC
  Administered 2016-01-24: 4 mg via INTRAVENOUS
  Filled 2016-01-24: qty 1

## 2016-01-24 MED ORDER — POTASSIUM CHLORIDE 10 MEQ/100ML IV SOLN
10.0000 meq | INTRAVENOUS | Status: AC
  Administered 2016-01-24: 10 meq via INTRAVENOUS
  Filled 2016-01-24 (×2): qty 100

## 2016-01-24 MED ORDER — ACETAMINOPHEN 325 MG PO TABS
650.0000 mg | ORAL_TABLET | Freq: Once | ORAL | Status: AC
  Administered 2016-01-24: 650 mg via ORAL
  Filled 2016-01-24: qty 2

## 2016-01-24 NOTE — ED Notes (Signed)
Pt standing, became "woozy" sat again.Pt sat on edge of bed 3 minutes, still dizzy.

## 2016-01-24 NOTE — ED Notes (Signed)
Pt has gotten up to walk to the bathroom.  He is slow, but steady stating his knees hurt him (chronically) but was able to go to the bathroom with standby assist.  While in bathroom he was given instructions on emptying his foley and was able to demonstrate appropriately.  After instruction and demo, he needed privacy to have a BM.  Will educate on how to switch from foley bag to leg bag when he is finished.

## 2016-01-24 NOTE — ED Notes (Signed)
Bladder scanner- 

## 2016-01-24 NOTE — ED Notes (Signed)
Bed: [id number] Expected date:  Expected time:  Means of arrival:  Comments: EMS intoxicated and anxious

## 2016-01-24 NOTE — ED Provider Notes (Signed)
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 further. 3. 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 This appears T2 hyperintense with perhaps mild cord expansion. The size and configuration of the lesion appear stable since 02/17/2015, arguing against acute clinical significance. Top differential considerations include myelomalacia and small benign spinal cord neoplasm (such as ependymoma). 4. No acute osseous abnormality identified. 5. Severe urinary bladder distension. Recommend bladder catheterization if the patient is unable to void. Electronically Signed   By: Odessa Fleming M.D.   On: 01/24/2016 10:12    EKG Interpretation  Date/Time:  Thursday January 24 2016 08:12:09 EDT Ventricular Rate:  84 PR Interval:  137 QRS Duration: 91 QT Interval:  396 QTC Calculation: 468 R Axis:   10 Text Interpretation:  Sinus rhythm Abnormal R-wave progression, early transition Borderline repolarization abnormality nonspecific T waves improved compared to 2016 Confirmed by Varnika Butz  MD, Nicolet Griffy ([id number]) on 01/24/2016 8:16:26 AM          Pricilla Loveless, MD 01/24/16 1316
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
## 2016-01-24 NOTE — ED Notes (Signed)
MD at bedside. 

## 2016-01-24 NOTE — ED Notes (Signed)
Unsuccessful IV attempt by this nurse.

## 2016-01-24 NOTE — ED Notes (Signed)
Patient transported to MRI 

## 2016-01-24 NOTE — ED Notes (Signed)
Pt attempted to stand but sat back down due to pain/intoxication
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
## 2016-01-24 NOTE — ED Notes (Signed)
Post void cath 

## 2016-01-24 NOTE — ED Notes (Signed)
Pt has asked if he can get up to walk when his potassium infusion is complete so he can be discharged to go home.  The potassium gtt is almost complete.

## 2016-04-16 ENCOUNTER — Encounter (HOSPITAL_COMMUNITY): Payer: Self-pay | Admitting: Oncology

## 2016-04-16 ENCOUNTER — Emergency Department (HOSPITAL_COMMUNITY)
Admission: EM | Admit: 2016-04-16 | Discharge: 2016-04-16 | Disposition: A | Discharge status: Other Acute Inpatient Hospital | Payer: Non-veteran care | Attending: Emergency Medicine | Admitting: Emergency Medicine

## 2016-04-16 ENCOUNTER — Inpatient Hospital Stay (HOSPITAL_COMMUNITY)
Admission: AD | Admit: 2016-04-16 | Discharge: 2016-04-23 | DRG: 885 | Disposition: A | Discharge status: Home / Self Care | Payer: Medicare Other | Source: Intra-hospital | Attending: Psychiatry | Admitting: Psychiatry

## 2016-04-16 ENCOUNTER — Encounter (HOSPITAL_COMMUNITY): Payer: Self-pay

## 2016-04-16 DIAGNOSIS — Z9884 Bariatric surgery status: Secondary | ICD-10-CM

## 2016-04-16 DIAGNOSIS — E1122 Type 2 diabetes mellitus with diabetic chronic kidney disease: Secondary | ICD-10-CM | POA: Diagnosis not present

## 2016-04-16 DIAGNOSIS — E162 Hypoglycemia, unspecified: Secondary | ICD-10-CM | POA: Diagnosis not present

## 2016-04-16 DIAGNOSIS — Z981 Arthrodesis status: Secondary | ICD-10-CM | POA: Diagnosis not present

## 2016-04-16 DIAGNOSIS — F1420 Cocaine dependence, uncomplicated: Secondary | ICD-10-CM | POA: Diagnosis present

## 2016-04-16 DIAGNOSIS — M1990 Unspecified osteoarthritis, unspecified site: Secondary | ICD-10-CM | POA: Insufficient documentation

## 2016-04-16 DIAGNOSIS — Z800 Family history of malignant neoplasm of digestive organs: Secondary | ICD-10-CM

## 2016-04-16 DIAGNOSIS — N183 Chronic kidney disease, stage 3 (moderate): Secondary | ICD-10-CM | POA: Diagnosis present

## 2016-04-16 DIAGNOSIS — E876 Hypokalemia: Secondary | ICD-10-CM | POA: Diagnosis present

## 2016-04-16 DIAGNOSIS — I129 Hypertensive chronic kidney disease with stage 1 through stage 4 chronic kidney disease, or unspecified chronic kidney disease: Secondary | ICD-10-CM | POA: Diagnosis present

## 2016-04-16 DIAGNOSIS — F332 Major depressive disorder, recurrent severe without psychotic features: Principal | ICD-10-CM | POA: Diagnosis present

## 2016-04-16 DIAGNOSIS — Z96652 Presence of left artificial knee joint: Secondary | ICD-10-CM | POA: Diagnosis present

## 2016-04-16 DIAGNOSIS — Z8249 Family history of ischemic heart disease and other diseases of the circulatory system: Secondary | ICD-10-CM

## 2016-04-16 DIAGNOSIS — G4700 Insomnia, unspecified: Secondary | ICD-10-CM | POA: Diagnosis present

## 2016-04-16 DIAGNOSIS — F10230 Alcohol dependence with withdrawal, uncomplicated: Secondary | ICD-10-CM | POA: Diagnosis present

## 2016-04-16 DIAGNOSIS — F4310 Post-traumatic stress disorder, unspecified: Secondary | ICD-10-CM | POA: Diagnosis present

## 2016-04-16 DIAGNOSIS — M79601 Pain in right arm: Secondary | ICD-10-CM | POA: Diagnosis present

## 2016-04-16 DIAGNOSIS — M79602 Pain in left arm: Secondary | ICD-10-CM | POA: Diagnosis present

## 2016-04-16 DIAGNOSIS — E785 Hyperlipidemia, unspecified: Secondary | ICD-10-CM | POA: Diagnosis present

## 2016-04-16 DIAGNOSIS — R45851 Suicidal ideations: Secondary | ICD-10-CM | POA: Diagnosis present

## 2016-04-16 DIAGNOSIS — M1712 Unilateral primary osteoarthritis, left knee: Secondary | ICD-10-CM | POA: Diagnosis present

## 2016-04-16 DIAGNOSIS — Z79899 Other long term (current) drug therapy: Secondary | ICD-10-CM

## 2016-04-16 DIAGNOSIS — N189 Chronic kidney disease, unspecified: Secondary | ICD-10-CM | POA: Insufficient documentation

## 2016-04-16 DIAGNOSIS — M79603 Pain in arm, unspecified: Secondary | ICD-10-CM | POA: Diagnosis not present

## 2016-04-16 DIAGNOSIS — F410 Panic disorder [episodic paroxysmal anxiety] without agoraphobia: Secondary | ICD-10-CM | POA: Diagnosis present

## 2016-04-16 DIAGNOSIS — E1165 Type 2 diabetes mellitus with hyperglycemia: Secondary | ICD-10-CM | POA: Diagnosis present

## 2016-04-16 DIAGNOSIS — Z7984 Long term (current) use of oral hypoglycemic drugs: Secondary | ICD-10-CM

## 2016-04-16 DIAGNOSIS — G4733 Obstructive sleep apnea (adult) (pediatric): Secondary | ICD-10-CM | POA: Diagnosis present

## 2016-04-16 DIAGNOSIS — Z833 Family history of diabetes mellitus: Secondary | ICD-10-CM

## 2016-04-16 LAB — CBC
HCT: 42.8 % (ref 39.0–52.0)
Hemoglobin: 14.4 g/dL (ref 13.0–17.0)
MCH: 28.3 pg (ref 26.0–34.0)
MCHC: 33.6 g/dL (ref 30.0–36.0)
MCV: 84.1 fL (ref 78.0–100.0)
Platelets: 226 10*3/uL (ref 150–400)
RBC: 5.09 MIL/uL (ref 4.22–5.81)
RDW: 13.7 % (ref 11.5–15.5)
WBC: 6.3 10*3/uL (ref 4.0–10.5)

## 2016-04-16 LAB — COMPREHENSIVE METABOLIC PANEL
ALT: 18 U/L (ref 17–63)
AST: 33 U/L (ref 15–41)
Albumin: 3.6 g/dL (ref 3.5–5.0)
Alkaline Phosphatase: 110 U/L (ref 38–126)
Anion gap: 16 — ABNORMAL HIGH (ref 5–15)
BUN: 14 mg/dL (ref 6–20)
CO2: 25 mmol/L (ref 22–32)
Calcium: 8.9 mg/dL (ref 8.9–10.3)
Chloride: 96 mmol/L — ABNORMAL LOW (ref 101–111)
Creatinine, Ser: 1.82 mg/dL — ABNORMAL HIGH (ref 0.61–1.24)
GFR calc Af Amer: 46 mL/min — ABNORMAL LOW (ref >60)
GFR calc non Af Amer: 40 mL/min — ABNORMAL LOW (ref >60)
Glucose, Bld: 144 mg/dL — ABNORMAL HIGH (ref 65–99)
Potassium: 2.9 mmol/L — ABNORMAL LOW (ref 3.5–5.1)
Sodium: 137 mmol/L (ref 135–145)
Total Bilirubin: 1.4 mg/dL — ABNORMAL HIGH (ref 0.3–1.2)
Total Protein: 7.1 g/dL (ref 6.5–8.1)

## 2016-04-16 LAB — SALICYLATE LEVEL: Salicylate Lvl: <4 mg/dL (ref 2.8–30.0)

## 2016-04-16 LAB — GLUCOSE, CAPILLARY: Glucose-Capillary: 188 mg/dL — ABNORMAL HIGH (ref 65–99)

## 2016-04-16 LAB — BASIC METABOLIC PANEL
ANION GAP: 12 (ref 5–15)
BUN: 15 mg/dL (ref 6–20)
CO2: 24 mmol/L (ref 22–32)
Calcium: 8.1 mg/dL — ABNORMAL LOW (ref 8.9–10.3)
Chloride: 100 mmol/L — ABNORMAL LOW (ref 101–111)
Creatinine, Ser: 1.56 mg/dL — ABNORMAL HIGH (ref 0.61–1.24)
GFR calc Af Amer: 56 mL/min — ABNORMAL LOW (ref >60)
GFR, EST NON AFRICAN AMERICAN: 48 mL/min — AB (ref >60)
Glucose, Bld: 173 mg/dL — ABNORMAL HIGH (ref 65–99)
POTASSIUM: 2.9 mmol/L — AB (ref 3.5–5.1)
SODIUM: 136 mmol/L (ref 135–145)

## 2016-04-16 LAB — RAPID URINE DRUG SCREEN, HOSP PERFORMED
Amphetamines: NOT DETECTED
Barbiturates: NOT DETECTED
Benzodiazepines: NOT DETECTED
Cocaine: POSITIVE — AB
Opiates: NOT DETECTED
Tetrahydrocannabinol: NOT DETECTED

## 2016-04-16 LAB — ETHANOL: Alcohol, Ethyl (B): 169 mg/dL — ABNORMAL HIGH (ref <5)

## 2016-04-16 LAB — CBG MONITORING, ED: GLUCOSE-CAPILLARY: 146 mg/dL — AB (ref 65–99)

## 2016-04-16 LAB — ACETAMINOPHEN LEVEL: Acetaminophen (Tylenol), Serum: <10 ug/mL — ABNORMAL LOW (ref 10–30)

## 2016-04-16 MED ORDER — ADULT MULTIVITAMIN W/MINERALS CH
1.0000 | ORAL_TABLET | Freq: Every day | ORAL | Status: DC
  Administered 2016-04-16: 1 via ORAL
  Filled 2016-04-16: qty 1

## 2016-04-16 MED ORDER — LORAZEPAM 1 MG PO TABS
1.0000 mg | ORAL_TABLET | Freq: Every day | ORAL | Status: AC
  Administered 2016-04-19 – 2016-04-20 (×2): 1 mg via ORAL

## 2016-04-16 MED ORDER — LORAZEPAM 1 MG PO TABS: 1.0000 mg | ORAL_TABLET | Freq: Three times a day (TID) | ORAL | Status: DC

## 2016-04-16 MED ORDER — POTASSIUM CHLORIDE 10 MEQ/100ML IV SOLN
10.0000 meq | INTRAVENOUS | Status: AC
  Administered 2016-04-16 (×2): 10 meq via INTRAVENOUS
  Filled 2016-04-16 (×2): qty 100

## 2016-04-16 MED ORDER — THIAMINE HCL 100 MG/ML IJ SOLN: 100.0000 mg | Freq: Once | INTRAMUSCULAR | Status: DC

## 2016-04-16 MED ORDER — AMLODIPINE BESYLATE 10 MG PO TABS
10.0000 mg | ORAL_TABLET | Freq: Every day | ORAL | Status: DC
  Administered 2016-04-17 – 2016-04-23 (×7): 10 mg via ORAL
  Filled 2016-04-16 (×8): qty 1

## 2016-04-16 MED ORDER — ACETAMINOPHEN 325 MG PO TABS
650.0000 mg | ORAL_TABLET | Freq: Four times a day (QID) | ORAL | Status: DC | PRN
  Administered 2016-04-16 – 2016-04-22 (×8): 650 mg via ORAL
  Filled 2016-04-16 (×8): qty 2

## 2016-04-16 MED ORDER — LORAZEPAM 1 MG PO TABS
1.0000 mg | ORAL_TABLET | Freq: Two times a day (BID) | ORAL | Status: AC
  Administered 2016-04-18 – 2016-04-19 (×2): 1 mg via ORAL
  Filled 2016-04-16 (×2): qty 1

## 2016-04-16 MED ORDER — SIMVASTATIN 20 MG PO TABS: 20.0000 mg | ORAL_TABLET | Freq: Every day | ORAL | Status: DC

## 2016-04-16 MED ORDER — POTASSIUM CHLORIDE CRYS ER 20 MEQ PO TBCR
40.0000 meq | EXTENDED_RELEASE_TABLET | Freq: Once | ORAL | Status: AC
  Administered 2016-04-16: 40 meq via ORAL
  Filled 2016-04-16: qty 2

## 2016-04-16 MED ORDER — GABAPENTIN 300 MG PO CAPS
600.0000 mg | ORAL_CAPSULE | Freq: Two times a day (BID) | ORAL | Status: DC
  Administered 2016-04-16 – 2016-04-17 (×3): 600 mg via ORAL
  Filled 2016-04-16 (×7): qty 2

## 2016-04-16 MED ORDER — METFORMIN HCL 500 MG PO TABS
1000.0000 mg | ORAL_TABLET | Freq: Two times a day (BID) | ORAL | Status: DC
  Filled 2016-04-16: qty 2

## 2016-04-16 MED ORDER — LORAZEPAM 1 MG PO TABS
1.0000 mg | ORAL_TABLET | Freq: Four times a day (QID) | ORAL | Status: DC
  Administered 2016-04-16: 1 mg via ORAL
  Filled 2016-04-16: qty 1

## 2016-04-16 MED ORDER — METFORMIN HCL 500 MG PO TABS
ORAL_TABLET | ORAL | Status: AC
  Filled 2016-04-16: qty 2

## 2016-04-16 MED ORDER — HYDROXYZINE HCL 25 MG PO TABS: 25.0000 mg | ORAL_TABLET | Freq: Four times a day (QID) | ORAL | Status: DC | PRN

## 2016-04-16 MED ORDER — METFORMIN HCL 500 MG PO TABS
1000.0000 mg | ORAL_TABLET | Freq: Two times a day (BID) | ORAL | Status: DC
  Administered 2016-04-16 – 2016-04-23 (×11): 1000 mg via ORAL
  Filled 2016-04-16 (×16): qty 2

## 2016-04-16 MED ORDER — LISINOPRIL 40 MG PO TABS
40.0000 mg | ORAL_TABLET | Freq: Every day | ORAL | Status: DC
  Administered 2016-04-16: 40 mg via ORAL
  Filled 2016-04-16: qty 1

## 2016-04-16 MED ORDER — ADULT MULTIVITAMIN W/MINERALS CH
1.0000 | ORAL_TABLET | Freq: Every day | ORAL | Status: DC
  Administered 2016-04-17 – 2016-04-23 (×7): 1 via ORAL
  Filled 2016-04-16 (×8): qty 1

## 2016-04-16 MED ORDER — LORAZEPAM 1 MG PO TABS
1.0000 mg | ORAL_TABLET | Freq: Three times a day (TID) | ORAL | Status: AC
  Administered 2016-04-17 – 2016-04-18 (×3): 1 mg via ORAL
  Filled 2016-04-16 (×2): qty 1

## 2016-04-16 MED ORDER — VITAMIN B-1 100 MG PO TABS
100.0000 mg | ORAL_TABLET | Freq: Every day | ORAL | Status: DC
  Administered 2016-04-17 – 2016-04-23 (×7): 100 mg via ORAL
  Filled 2016-04-16 (×8): qty 1

## 2016-04-16 MED ORDER — SIMVASTATIN 20 MG PO TABS
20.0000 mg | ORAL_TABLET | Freq: Every day | ORAL | Status: DC
  Administered 2016-04-17 – 2016-04-22 (×7): 20 mg via ORAL
  Filled 2016-04-16 (×10): qty 1

## 2016-04-16 MED ORDER — CHLORTHALIDONE 25 MG PO TABS
25.0000 mg | ORAL_TABLET | Freq: Every day | ORAL | Status: DC
  Administered 2016-04-17 – 2016-04-23 (×7): 25 mg via ORAL
  Filled 2016-04-16 (×8): qty 1

## 2016-04-16 MED ORDER — LORAZEPAM 1 MG PO TABS
1.0000 mg | ORAL_TABLET | Freq: Four times a day (QID) | ORAL | Status: AC
  Administered 2016-04-16 – 2016-04-17 (×3): 1 mg via ORAL
  Filled 2016-04-16 (×2): qty 1

## 2016-04-16 MED ORDER — TAMSULOSIN HCL 0.4 MG PO CAPS
0.4000 mg | ORAL_CAPSULE | Freq: Every day | ORAL | Status: DC
  Administered 2016-04-17 – 2016-04-23 (×7): 0.4 mg via ORAL
  Filled 2016-04-16 (×8): qty 1

## 2016-04-16 MED ORDER — GLIPIZIDE 5 MG PO TABS
5.0000 mg | ORAL_TABLET | Freq: Two times a day (BID) | ORAL | Status: DC
  Administered 2016-04-16: 5 mg via ORAL
  Filled 2016-04-16 (×8): qty 1

## 2016-04-16 MED ORDER — GABAPENTIN 600 MG PO TABS
ORAL_TABLET | ORAL | Status: AC
  Filled 2016-04-16: qty 1

## 2016-04-16 MED ORDER — LORAZEPAM 1 MG PO TABS
ORAL_TABLET | ORAL | Status: AC
  Administered 2016-04-16: 17:00:00
  Filled 2016-04-16: qty 1

## 2016-04-16 MED ORDER — LORAZEPAM 1 MG PO TABS: 1.0000 mg | ORAL_TABLET | Freq: Two times a day (BID) | ORAL | Status: DC

## 2016-04-16 MED ORDER — LOPERAMIDE HCL 2 MG PO CAPS: 2.0000 mg | ORAL_CAPSULE | ORAL | Status: AC | PRN

## 2016-04-16 MED ORDER — AMLODIPINE BESYLATE 10 MG PO TABS
10.0000 mg | ORAL_TABLET | Freq: Every day | ORAL | Status: DC
  Administered 2016-04-16: 10 mg via ORAL
  Filled 2016-04-16: qty 1

## 2016-04-16 MED ORDER — ONDANSETRON 4 MG PO TBDP: 4.0000 mg | ORAL_TABLET | Freq: Four times a day (QID) | ORAL | Status: DC | PRN

## 2016-04-16 MED ORDER — ACETAMINOPHEN 325 MG PO TABS
650.0000 mg | ORAL_TABLET | Freq: Once | ORAL | Status: AC
  Administered 2016-04-16: 650 mg via ORAL
  Filled 2016-04-16: qty 2

## 2016-04-16 MED ORDER — SODIUM CHLORIDE 0.9 % IV BOLUS (SEPSIS)
1000.0000 mL | Freq: Once | INTRAVENOUS | Status: AC
  Administered 2016-04-16: 1000 mL via INTRAVENOUS

## 2016-04-16 MED ORDER — VITAMIN B-1 100 MG PO TABS
100.0000 mg | ORAL_TABLET | Freq: Every day | ORAL | Status: DC
Start: 2016-04-17 — End: 2016-04-16

## 2016-04-16 MED ORDER — LOPERAMIDE HCL 2 MG PO CAPS: 2.0000 mg | ORAL_CAPSULE | ORAL | Status: DC | PRN

## 2016-04-16 MED ORDER — TAMSULOSIN HCL 0.4 MG PO CAPS
0.4000 mg | ORAL_CAPSULE | Freq: Every day | ORAL | Status: DC
  Administered 2016-04-16: 0.4 mg via ORAL
  Filled 2016-04-16: qty 1

## 2016-04-16 MED ORDER — GABAPENTIN 300 MG PO CAPS
600.0000 mg | ORAL_CAPSULE | Freq: Two times a day (BID) | ORAL | Status: DC
  Administered 2016-04-16: 600 mg via ORAL
  Filled 2016-04-16: qty 2

## 2016-04-16 MED ORDER — MAGNESIUM HYDROXIDE 400 MG/5ML PO SUSP: 30.0000 mL | Freq: Every day | ORAL | Status: DC | PRN

## 2016-04-16 MED ORDER — LORAZEPAM 1 MG PO TABS: 1.0000 mg | ORAL_TABLET | Freq: Four times a day (QID) | ORAL | Status: DC | PRN

## 2016-04-16 MED ORDER — MIRTAZAPINE 30 MG PO TABS
30.0000 mg | ORAL_TABLET | Freq: Every day | ORAL | Status: DC
  Administered 2016-04-17: 30 mg via ORAL
  Filled 2016-04-16 (×4): qty 1

## 2016-04-16 MED ORDER — LORAZEPAM 1 MG PO TABS: 1.0000 mg | ORAL_TABLET | Freq: Every day | ORAL | Status: DC

## 2016-04-16 MED ORDER — ONDANSETRON 4 MG PO TBDP
4.0000 mg | ORAL_TABLET | Freq: Four times a day (QID) | ORAL | Status: AC | PRN
  Administered 2016-04-16: 4 mg via ORAL
  Filled 2016-04-16: qty 1

## 2016-04-16 MED ORDER — CHLORTHALIDONE 25 MG PO TABS
25.0000 mg | ORAL_TABLET | Freq: Every day | ORAL | Status: DC
  Administered 2016-04-16: 25 mg via ORAL
  Filled 2016-04-16: qty 1

## 2016-04-16 MED ORDER — ALUM & MAG HYDROXIDE-SIMETH 200-200-20 MG/5ML PO SUSP: 30.0000 mL | ORAL | Status: DC | PRN

## 2016-04-16 MED ORDER — MAGNESIUM SULFATE 2 GM/50ML IV SOLN
2.0000 g | Freq: Once | INTRAVENOUS | Status: AC
Start: 2016-04-16 — End: 2016-04-16
  Administered 2016-04-16: 2 g via INTRAVENOUS
  Filled 2016-04-16: qty 50

## 2016-04-16 MED ORDER — MIRTAZAPINE 30 MG PO TABS
30.0000 mg | ORAL_TABLET | Freq: Every day | ORAL | Status: DC
  Administered 2016-04-16: 30 mg via ORAL
  Filled 2016-04-16: qty 1

## 2016-04-16 MED ORDER — GLIPIZIDE 5 MG PO TABS
5.0000 mg | ORAL_TABLET | Freq: Two times a day (BID) | ORAL | Status: DC
  Filled 2016-04-16: qty 1

## 2016-04-16 MED ORDER — LISINOPRIL 40 MG PO TABS
40.0000 mg | ORAL_TABLET | Freq: Every day | ORAL | Status: DC
  Administered 2016-04-17 – 2016-04-23 (×7): 40 mg via ORAL
  Filled 2016-04-16 (×8): qty 1

## 2016-04-16 MED ORDER — LORAZEPAM 1 MG PO TABS
1.0000 mg | ORAL_TABLET | Freq: Four times a day (QID) | ORAL | Status: AC | PRN
  Administered 2016-04-17 – 2016-04-18 (×2): 1 mg via ORAL
  Filled 2016-04-16 (×4): qty 1

## 2016-04-16 MED ORDER — HYDROXYZINE HCL 25 MG PO TABS
25.0000 mg | ORAL_TABLET | Freq: Four times a day (QID) | ORAL | Status: AC | PRN
  Administered 2016-04-17 – 2016-04-18 (×2): 25 mg via ORAL
  Filled 2016-04-16 (×3): qty 1
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
## 2016-04-16 NOTE — ED Notes (Signed)
I have just given report to Armando [facility]ichertMarian, [facility]; and we are notifying [facility] now.  I have his voluntary papers in hand and we have d/c'd. His IV.
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
## 2016-04-16 NOTE — ED Notes (Signed)
I have just spoken with pharmacy and they will release additional meds, as he is hypertensive.  I had had them release meds previously to deal with his extremity pain.
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
## 2016-04-16 NOTE — ED Notes (Signed)
[facility] at this time without incident.
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
## 2016-04-16 NOTE — Tx Team (Signed)
Initial Interdisciplinary Treatment Plan   PATIENT STRESSORS: Financial difficulties Legal issue Substance abuse Traumatic event   PATIENT STRENGTHS: Ability for insight Capable of independent living Motivation for treatment/growth Religious Affiliation   PROBLEM LIST: Problem List/Patient Goals Date to be addressed Date deferred Reason deferred Estimated date of resolution  Depression 04/16/2016 7/19/207  D/C  Anxiety 04/16/2016 04/16/2016  D/C  Psychosis 04/16/2016 04/16/2016  D/C  "My alcohol use" 04/16/2016 04/16/2016  D/C  "Get safe houseing" 04/16/2016 04/16/2016  D/C                           DISCHARGE CRITERIA:  Ability to meet basic life and health needs Adequate post-discharge living arrangements Improved stabilization in mood, thinking, and/or behavior Medical problems require only outpatient monitoring  PRELIMINARY DISCHARGE PLAN: Outpatient therapy Placement in alternative living arrangements  PATIENT/FAMIILY INVOLVEMENT: This treatment plan has been presented to and reviewed with the patient, Angel Phillips. The patient and family have been given the opportunity to ask questions and make suggestions.  Almira Barenny G Jais Demir 04/16/2016, 6:38 PM

## 2016-04-16 NOTE — Progress Notes (Signed)
Patient ID: Angel Phillips, male   DOB: [birth year], 55 y.o.   MRN: [id number] PER STATE REGULATIONS 482.30  THIS CHART WAS REVIEWED FOR MEDICAL NECESSITY WITH RESPECT TO THE PATIENT'S ADMISSION/DURATION OF STAY.  NEXT REVIEW DATE:04/20/16  Loura HaltBARBARA Flora Parks, RN, BSN CASE MANAGER

## 2016-04-16 NOTE — ED Notes (Signed)
Thus far pt. Has been happy and cooperative, and seems to enjoy his sitter, Artricius.  I have had pharmacy release his meds to help with his chronic musculoskeletal discomfort (Ativan and Neurontin).  I now have given the bulk of his other meds, as we found him to be hypertensive.  As I write this, he is enjoying his lunch.

## 2016-04-16 NOTE — ED Notes (Signed)
Patient is c/o of pain in his thumbs that started when he arrived to hospital. Patient given water and sandwich. He is aware that urine sample is needed. He stated he might be able to go after he eats and drinks something.

## 2016-04-16 NOTE — ED Notes (Signed)
Our TTS evaluator is with him as I write this.

## 2016-04-16 NOTE — Progress Notes (Signed)
Patient did not attend AA group, was in room sleeping.

## 2016-04-17 ENCOUNTER — Encounter (HOSPITAL_COMMUNITY): Payer: Self-pay | Admitting: Psychiatry

## 2016-04-17 DIAGNOSIS — M79603 Pain in arm, unspecified: Secondary | ICD-10-CM

## 2016-04-17 DIAGNOSIS — F332 Major depressive disorder, recurrent severe without psychotic features: Principal | ICD-10-CM

## 2016-04-17 DIAGNOSIS — E1122 Type 2 diabetes mellitus with diabetic chronic kidney disease: Secondary | ICD-10-CM | POA: Diagnosis present

## 2016-04-17 DIAGNOSIS — N183 Chronic kidney disease, stage 3 (moderate): Secondary | ICD-10-CM

## 2016-04-17 LAB — GLUCOSE, CAPILLARY
GLUCOSE-CAPILLARY: 71 mg/dL (ref 65–99)
GLUCOSE-CAPILLARY: 74 mg/dL (ref 65–99)
GLUCOSE-CAPILLARY: 92 mg/dL (ref 65–99)
GLUCOSE-CAPILLARY: 84 mg/dL (ref 65–99)
Glucose-Capillary: 68 mg/dL (ref 65–99)
Glucose-Capillary: 154 mg/dL — ABNORMAL HIGH (ref 65–99)

## 2016-04-17 LAB — CK: CK TOTAL: 53 U/L (ref 49–397)

## 2016-04-17 MED ORDER — POTASSIUM CHLORIDE CRYS ER 20 MEQ PO TBCR
40.0000 meq | EXTENDED_RELEASE_TABLET | Freq: Once | ORAL | Status: AC
  Administered 2016-04-17: 40 meq via ORAL
  Filled 2016-04-17 (×2): qty 2

## 2016-04-17 MED ORDER — DULOXETINE HCL 30 MG PO CPEP
30.0000 mg | ORAL_CAPSULE | Freq: Every day | ORAL | Status: DC
  Administered 2016-04-17 – 2016-04-22 (×6): 30 mg via ORAL
  Filled 2016-04-17 (×10): qty 1

## 2016-04-17 MED ORDER — NAPROXEN 500 MG PO TABS
500.0000 mg | ORAL_TABLET | Freq: Two times a day (BID) | ORAL | Status: DC | PRN
  Administered 2016-04-17: 500 mg via ORAL
  Filled 2016-04-17: qty 1

## 2016-04-17 MED ORDER — DICLOFENAC SODIUM 1 % TD GEL
2.0000 g | Freq: Four times a day (QID) | TRANSDERMAL | Status: DC
  Administered 2016-04-17 – 2016-04-23 (×16): 2 g via TOPICAL
  Filled 2016-04-17: qty 100

## 2016-04-17 MED ORDER — MIRTAZAPINE 7.5 MG PO TABS
7.5000 mg | ORAL_TABLET | Freq: Every day | ORAL | Status: DC
  Administered 2016-04-17: 7.5 mg via ORAL
  Filled 2016-04-17 (×3): qty 1

## 2016-04-17 MED ORDER — ENSURE ENLIVE PO LIQD: 237.0000 mL | Freq: Every day | ORAL | Status: DC | PRN

## 2016-04-17 MED ORDER — POTASSIUM CHLORIDE CRYS ER 20 MEQ PO TBCR
40.0000 meq | EXTENDED_RELEASE_TABLET | Freq: Once | ORAL | Status: AC
  Administered 2016-04-17: 40 meq via ORAL
  Filled 2016-04-17: qty 2

## 2016-04-17 MED ORDER — COLCHICINE 0.6 MG PO TABS
0.6000 mg | ORAL_TABLET | Freq: Every day | ORAL | Status: DC
  Administered 2016-04-17 – 2016-04-22 (×6): 0.6 mg via ORAL
  Filled 2016-04-17 (×9): qty 1

## 2016-04-17 MED ORDER — POTASSIUM CHLORIDE CRYS ER 20 MEQ PO TBCR
EXTENDED_RELEASE_TABLET | ORAL | Status: AC
Start: 2016-04-17 — End: 2016-04-18
  Filled 2016-04-17: qty 1

## 2016-04-17 MED ORDER — GABAPENTIN 300 MG PO CAPS
600.0000 mg | ORAL_CAPSULE | Freq: Three times a day (TID) | ORAL | Status: DC
  Administered 2016-04-17 – 2016-04-23 (×17): 600 mg via ORAL
  Filled 2016-04-17 (×21): qty 2

## 2016-04-17 MED ORDER — GLIPIZIDE 2.5 MG HALF TABLET
2.5000 mg | ORAL_TABLET | Freq: Every day | ORAL | Status: DC
  Administered 2016-04-18: 09:00:00 via ORAL
  Filled 2016-04-17 (×3): qty 1

## 2016-04-17 MED ORDER — GLUCERNA SHAKE PO LIQD: 237.0000 mL | Freq: Every day | ORAL | Status: DC | PRN

## 2016-04-17 NOTE — [facility] Group Notes (Signed)
[facility] LCSW Group Therapy  04/17/2016 12:39 PM  Type of Therapy:  Group Therapy  Participation Level:  Did Not Attend-pt invited. Chose to rest in room.   Modes of Intervention:  Confrontation, Discussion, Education, Exploration, Problem-solving, Rapport Building, Socialization and Support  Summary of Progress/Problems: Emotion Regulation: This group focused on both positive and negative emotion identification and allowed group members to process ways to identify feelings, regulate negative emotions, and find healthy ways to manage internal/external emotions. Group members were asked to reflect on a time when their reaction to an emotion led to a negative outcome and explored how alternative responses using emotion regulation would have benefited them. Group members were also asked to discuss a time when emotion regulation was utilized when a negative emotion was experienced.   Smart, Carmilla Granville LCSW 04/17/2016, 12:39 PM

## 2016-04-17 NOTE — Progress Notes (Signed)
Patient ID: Angel Phillips, male   DOB: [birth year], 55 y.o.   MRN: [id number] Hypoglycemic Event  CBG: 68  Treatment: 15 GM carbohydrate snack  Symptoms: Hungry  Follow-up CBG: [id number] CBG Result: 74  Possible Reasons for Event: Inadequate meal intake  Comments/MD notified: NP May Agustin notified of this. Patient is tolerating well now. Encouraged to eat meals at proper times. Patient did not attend breakfast this morning. Nursing staff brought him a tray and encouraged him to eat.   Angel Phillips

## 2016-04-17 NOTE — Progress Notes (Signed)
Patient has been asleep most of the shift. He woke up to take his medications and states he is having numbness and tingling in both of his hands. Medications administered as prescribed. It was reported earlier today that Maisie Fushomas was experiencing nausea after dinner. Denies any nausea at this time. Denies SI/HI/AVH. Encouragement and support given.  Continue Q 15 minute checks for patient safety and medication effectiveness.
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
## 2016-04-17 NOTE — [facility] Group Notes (Signed)
[facility] Group Notes:  (Nursing/MHT/Case Management/Adjunct)  Date:  04/17/2016  Time:  10:25 AM  Type of Therapy:  Psychoeducational Skills  Participation Level:  Minimal  Participation Quality:  Appropriate  Affect:  Appropriate  Cognitive:  Appropriate  Insight:  Lacking  Engagement in Group:  Engaged  Modes of Intervention:  Discussion and Education  Summary of Progress/Problems: Patient attended group and was engaged.   Marzetta BoardDopson, Benjermin Korber E 04/17/2016, 10:25 AM

## 2016-04-17 NOTE — Progress Notes (Signed)
NUTRITION ASSESSMENT  Pt identified as at risk on the Malnutrition Screen Tool  INTERVENTION: 1. Educated patient on the importance of nutrition and encouraged intake of food and beverages. 2. Discussed weight goals. 3. Supplements: will order Ensure Enlive once/day PRN, this supplement provides 350 kcal and 20 grams of protein.  NUTRITION DIAGNOSIS: Unintentional weight loss related to sub-optimal intake as evidenced by pt report.   Goal: Pt to meet >/= 90% of their estimated nutrition needs.  Monitor:  PO intake  Assessment:  Pt admitted for SI and daily heavy consumption of alcohol and crack since April. No recent weight hx available. Chart review indicates 43 lb weight loss (17% body weight) in the past 14 months which is not significant for time frame. Will order supplement as outlined above.   55 y.o. male  Height: Ht Readings from Last 1 Encounters:  04/16/16 5\' 10"  (1.778 m)    Weight: Wt Readings from Last 1 Encounters:  04/16/16 205 lb (92.987 kg)    Weight Hx: Wt Readings from Last 10 Encounters:  04/16/16 205 lb (92.987 kg)  04/16/16 205 lb (92.987 kg)  02/19/15 248 lb 9.6 oz (112.764 kg)  12/21/14 248 lb 12.8 oz (112.855 kg)  12/15/14 254 lb (115.214 kg)  12/15/14 256 lb (116.121 kg)  12/11/14 254 lb (115.214 kg)  12/06/14 254 lb 7 oz (115.412 kg)  11/21/14 236 lb (107.049 kg)  10/31/14 236 lb (107.049 kg)    BMI:  Body mass index is 29.41 kg/(m^2). Pt meets criteria for overweight based on current BMI.  Estimated Nutritional Needs: Kcal: 25-30 kcal/kg Protein: > 1 gram protein/kg Fluid: 1 ml/kcal  Diet Order: Diet Heart Room service appropriate?: Yes; Fluid consistency:: Thin Pt is also offered choice of unit snacks mid-morning and mid-afternoon.  Pt is eating as desired.   Lab results and medications reviewed.    Trenton GammonJessica Keli Buehner, MS, RD, LDN Inpatient Clinical Dietitian Pager # [phone] After hours/weekend pager # [phone]
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
## 2016-04-17 NOTE — Progress Notes (Signed)
Patient ID: Angel Phillips, male   DOB: 1960-10-17, 55 y.o.   MRN: [id number]  DAR: Pt. Denies SI/HI and A/V Hallucinations. He reports sleep is fair, appetite is good, energy level is normal, and concentration is fair. He rates depression 6.5/10, hopelessness 7/10, and anxiety 7/10. He reports cravings, agitation, and irritability related to withdrawal symptoms. His CBG was 68 this morning and patient received Gatorade and peanut butter. His CBG was 74 on reassessment. Patient denied any symptoms of hypoglycemia and remained coherent throughout. Patient reports continued pain in his hands and arms as well as weakness when walking. He is receiving PRN medications for these complaints. He was utilizing a walker at this time to aid in ambulation. Some assistance was needed by staff after lunch to help patient back to the unit. A gait belt and wheelchair were used to get patient back to unit. His ankles are edematous at this time. Patient was encouraged to elevate feet at this time. Support and encouragement provided to the patient. Scheduled medications administered to patient per physician's orders. Patient is assertive and cooperative with current plan of care. He is seen in the milieu intermittently and is attending groups. Q15 minute checks are maintained for safety.
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
## 2016-04-17 NOTE — Progress Notes (Signed)
April from [facility] called about this patient. She is faxing over transfer packet but stated that pt MUST BE MEDICALLY STABLE if he wants to transfer to the [facility]. Patient is currently endorsing extreme acute hand pain (MD notified and contacted hospitalist) in addition to other medical issues at the moment. CSW will reassess in 24 hours to find out if patient is medically stable for transfer (if he wishes to do so). CSW also printed low income housing resources for areas surrounding [facility] and [place] [facility] [place] per pt's request. CSW encouraged pt to contact his family in [place], [place] to find out if they can assist with temporary housing until he is able to get disability check.   Trula SladeHeather Smart, MSW, LCSW Clinical Social Worker 04/17/2016 3:41 PM

## 2016-04-17 NOTE — H&P (Signed)
Medical Consultation   Angel Phillips  YQM:[id number]  DOB: 08/06/1961  DOA: 04/16/2016  PCP: Eula Listen, MD  Outpatient Specialists:    Requesting physician: Dr. Jama Flavors  Reason for consultation: Hypokalemia and bilateral arm pain  History of Present Illness: Angel Phillips is an 55 y.o. male with past medical history of diabetes, hypertension and CKD stage III admitted to the behavioral health because of alcohol abuse as well as cocaine abuse disorder. Patient also has major depression. Patient reported that his arm started to hurt since yesterday, he is very poor historian reported that he might been heard before but because of his use of cocaine and might be masking it. Denies any pain around the joint, he is on Neurontin. His potassium is 2.9, received 40 mg of milligram once earlier today. His creatinine is 1.56.     Review of Systems:  ROS As per HPI otherwise 10 point review of systems negative.   Past Medical History: Past Medical History  Diagnosis Date  . Hypertension   . Diabetes mellitus without complication (HCC)   . Arthritis   . Tuberculosis     tb positive 1989 ?   Marland Kitchen Depression   . PONV (postoperative nausea and vomiting)   . Chronic kidney disease 02/2013    ACUTE RENAL FAILURE  . HLD (hyperlipidemia)   . OSA (obstructive sleep apnea)   . Alcohol abuse     Past Surgical History: Past Surgical History  Procedure Laterality Date  . Knee arthroscopy    . Gastric bypass    . Spinal fusion      Harrington Rod  . Total knee arthroplasty Left 12/11/2014    Procedure: TOTAL KNEE ARTHROPLASTY;  Surgeon: Gean Birchwood, MD;  [facility];  Service: Orthopedics;  Laterality: Left;   Allergies:   Allergies  Allergen Reactions  . Oxycodone Itching and Other (See Comments)    "Makes me feel Bad"   Social History:  reports that he has never smoked. He has never used smokeless tobacco. He reports that he drinks alcohol. He reports that  he uses illicit drugs.  Family History: Family History  Problem Relation Age of Onset  . Diabetes Mother   . Hypertension Mother   . Colon cancer Mother   . Stomach cancer Father    Physical Exam: Filed Vitals:   04/16/16 1700 04/17/16 0730 04/17/16 0731 04/17/16 1133  BP: 96/62 132/77 140/80 169/83  Pulse: 85 71 78 105  Temp:  98.5 F (36.9 C)    TempSrc:  Oral    Resp:  20    Height:      Weight:      SpO2:        Constitutional:  Alert and awake, oriented x3, not in any acute distress. Eyes: PERLA, EOMI, irises appear normal, anicteric sclera,  ENMT: external ears and nose appear normal            Lips appears normal, oropharynx mucosa, tongue, posterior pharynx appear normal  Neck: neck appears normal, no masses, normal ROM, no thyromegaly, no JVD  CVS: S1-S2 clear, no murmur rubs or gallops, no LE edema, normal pedal pulses  Respiratory:  clear to auscultation bilaterally, no wheezing, rales or rhonchi. Respiratory effort normal. No accessory muscle use.  Abdomen: soft nontender, nondistended, normal bowel sounds, no hepatosplenomegaly, no hernias  Musculoskeletal: : no cyanosis, clubbing or edema noted bilaterally, No redness and no swelling around  his wrists suggest gouty arthritis.  Neuro: Cranial nerves II-XII intact, strength, sensation, reflexes Psych: judgement and insight appear normal, stable mood and affect, mental status Skin: no rashes or lesions or ulcers, no induration or nodules   Data reviewed:  I have personally reviewed following labs and imaging studies Labs:  CBC:  Recent Labs Lab 04/16/16 0626  WBC 6.3  HGB 14.4  HCT 42.8  MCV 84.1  PLT 226    Basic Metabolic Panel:  Recent Labs Lab 04/16/16 0626 04/16/16 0954  NA 137 136  K 2.9* 2.9*  CL 96* 100*  CO2 25 24  GLUCOSE 144* 173*  BUN 14 15  CREATININE 1.82* 1.56*  CALCIUM 8.9 8.1*   GFR Estimated Creatinine Clearance: 60.6 mL/min (by C-G formula based on Cr of 1.56). Liver  Function Tests:  Recent Labs Lab 04/16/16 0626  AST 33  ALT 18  ALKPHOS 110  BILITOT 1.4*  PROT 7.1  ALBUMIN 3.6   No results for input(s): LIPASE, AMYLASE in the last 168 hours. No results for input(s): AMMONIA in the last 168 hours. Coagulation profile No results for input(s): INR, PROTIME in the last 168 hours.  Cardiac Enzymes: No results for input(s): CKTOTAL, CKMB, CKMBINDEX, TROPONINI in the last 168 hours. BNP: Invalid input(s): POCBNP CBG:  Recent Labs Lab 04/16/16 1653 04/17/16 0609 04/17/16 0829 04/17/16 0852 04/17/16 1214  GLUCAP 188* 71 68 74 154*   D-Dimer No results for input(s): DDIMER in the last 72 hours. Hgb A1c No results for input(s): HGBA1C in the last 72 hours. Lipid Profile No results for input(s): CHOL, HDL, LDLCALC, TRIG, CHOLHDL, LDLDIRECT in the last 72 hours. Thyroid function studies No results for input(s): TSH, T4TOTAL, T3FREE, THYROIDAB in the last 72 hours.  Invalid input(s): FREET3 Anemia work up No results for input(s): VITAMINB12, FOLATE, FERRITIN, TIBC, IRON, RETICCTPCT in the last 72 hours. Urinalysis    Component Value Date/Time   COLORURINE YELLOW 01/24/2016 1041   APPEARANCEUR CLEAR 01/24/2016 1041   LABSPEC 1.008 01/24/2016 1041   PHURINE 6.5 01/24/2016 1041   GLUCOSEU NEGATIVE 01/24/2016 1041   HGBUR NEGATIVE 01/24/2016 1041   BILIRUBINUR NEGATIVE 01/24/2016 1041   KETONESUR NEGATIVE 01/24/2016 1041   PROTEINUR NEGATIVE 01/24/2016 1041   UROBILINOGEN 1.0 02/16/2015 2133   NITRITE NEGATIVE 01/24/2016 1041   LEUKOCYTESUR NEGATIVE 01/24/2016 1041     Microbiology No results found for this or any previous visit (from the past 240 hour(s)).     Inpatient Medications:   Scheduled Meds: . amLODipine  10 mg Oral Daily  . chlorthalidone  25 mg Oral Q breakfast  . colchicine  0.6 mg Oral Daily  . diclofenac sodium  2 g Topical QID  . DULoxetine  30 mg Oral Daily  . gabapentin  600 mg Oral TID  . [START ON  04/18/2016] glipiZIDE  2.5 mg Oral QAC breakfast  . lisinopril  40 mg Oral Daily  . LORazepam  1 mg Oral QID   Followed by  . LORazepam  1 mg Oral TID   Followed by  . [START ON 04/18/2016] LORazepam  1 mg Oral BID   Followed by  . [START ON 04/20/2016] LORazepam  1 mg Oral Daily  . metFORMIN  1,000 mg Oral BID WC  . [START ON 04/18/2016] mirtazapine  7.5 mg Oral Daily  . multivitamin with minerals  1 tablet Oral Daily  . potassium chloride  40 mEq Oral Once  . potassium chloride  40 mEq Oral Once  .  simvastatin  20 mg Oral QHS  . tamsulosin  0.4 mg Oral Daily  . thiamine  100 mg Intramuscular Once  . thiamine  100 mg Oral Daily   Continuous Infusions:    Radiological Exams on Admission: No results found.  Impression/Recommendations Principal Problem:   Major depressive disorder, recurrent severe without psychotic features (HCC) Active Problems:   Hypokalemia   CKD stage 3 due to type 2 diabetes mellitus (HCC)   Arm pain, musculoskeletal   Hypokalemia -Unclear, patient has CKD stage III and is on 40 mg of lisinopril, and 40 mEq of potassium earlier. -Repeat potassium and check magnesium in a.m. Check BMP in a.m.  Bilateral arm pain -Unclear to me, exam did not show any redness, swelling or warmth around the joints to suggest gouty arthritis. -His pain is towards where you will wear a watch. He is very tender bilaterally. -The fact it's bilateral, do not think is related to trauma, so I don't think x-rays going to be very helpful. Check uric acid and start colchicine.  Hypertension -Continue home medications, consider loop diuretics if continue to be elevated. -Multiple medications including lisinopril, amlodipine and chlorthalidone. Not on beta blockers because of cocaine abuse.  CKD stage III -Creatinine baseline at 1.5. This is likely secondary to diabetes. -Patient is on lisinopril, continued is on amlodipine as well. -Discontinued systemic NSAIDs.  Diabetes  mellitus type 2 -Continue oral hypoglycemics.   Time Spent: 50 minutes  Macarius Ruark A M.D. [facility] 04/17/2016, 4:52 PM

## 2016-04-18 LAB — BASIC METABOLIC PANEL
ANION GAP: 9 (ref 5–15)
BUN: 22 mg/dL — ABNORMAL HIGH (ref 6–20)
CALCIUM: 8.8 mg/dL — AB (ref 8.9–10.3)
CO2: 28 mmol/L (ref 22–32)
Chloride: 102 mmol/L (ref 101–111)
Creatinine, Ser: 1.49 mg/dL — ABNORMAL HIGH (ref 0.61–1.24)
GFR, EST AFRICAN AMERICAN: 59 mL/min — AB (ref >60)
GFR, EST NON AFRICAN AMERICAN: 51 mL/min — AB (ref >60)
GLUCOSE: 117 mg/dL — AB (ref 65–99)
Potassium: 4.1 mmol/L (ref 3.5–5.1)
SODIUM: 139 mmol/L (ref 135–145)

## 2016-04-18 LAB — GLUCOSE, CAPILLARY
GLUCOSE-CAPILLARY: 117 mg/dL — AB (ref 65–99)
GLUCOSE-CAPILLARY: 47 mg/dL — AB (ref 65–99)
GLUCOSE-CAPILLARY: 72 mg/dL (ref 65–99)
GLUCOSE-CAPILLARY: 67 mg/dL (ref 65–99)
Glucose-Capillary: 31 mg/dL — CL (ref 65–99)
Glucose-Capillary: 108 mg/dL — ABNORMAL HIGH (ref 65–99)

## 2016-04-18 LAB — URIC ACID: URIC ACID, SERUM: 6.5 mg/dL (ref 4.4–7.6)

## 2016-04-18 LAB — MAGNESIUM: MAGNESIUM: 2.1 mg/dL (ref 1.7–2.4)

## 2016-04-18 MED ORDER — GLUCOSE 40 % PO GEL
1.0000 | Freq: Once | ORAL | Status: AC
  Administered 2016-04-18: 37.5 g via ORAL

## 2016-04-18 MED ORDER — MIRTAZAPINE 15 MG PO TABS
15.0000 mg | ORAL_TABLET | Freq: Every day | ORAL | Status: DC
  Administered 2016-04-18 – 2016-04-19 (×2): 15 mg via ORAL
  Filled 2016-04-18 (×4): qty 1

## 2016-04-18 NOTE — Progress Notes (Signed)
Patient did not attend the evening speaker AA meeting. Pt was notified that group was beginning but remained in bed.   

## 2016-04-18 NOTE — Progress Notes (Addendum)
Patient is staying in his room, in bed most of the day.  At 11:33am his CBG was 31. Patient still responsive and in his room eating chips and peanut butter. MD & NP notified and order received for glucose gel. Gel administered and CBG at 11:46pm was 47. Peanut butter crackers and milk administered. CBG at 11:54am 72. Patient taken to lunch and ate well. Order received for frequent snacks. Diabetic consult requested. Order received for snacks between meals and before bed. 5pm CBG was 67. PM oral diabetic meds held per MD order. On coming RN notified to hold AM doses, of oral diabetic meds, 04/19/16 until MD/NP can reevaluated in AM.  Patient continues to C/O chronic pain in his bilateral hands, left shoulder and bilateral al knees. Continues on ativan protical for ETOH detox. Denies SI/HI/AVH.

## 2016-04-18 NOTE — [facility] Group Notes (Signed)
[facility] LCSW Group Therapy  04/18/2016 3:16 PM  Type of Therapy:  Group Therapy  Participation Level:  Did Not Attend-pt invited. Chose to rest in bed. Pt continues to endorse drowsiness and pain issues in hands.   Summary of Progress/Problems: Feelings around Relapse. Group members discussed the meaning of relapse and shared personal stories of relapse, how it affected them and others, and how they perceived themselves during this time. Group members were encouraged to identify triggers, warning signs and coping skills used when facing the possibility of relapse. Social supports were discussed and explored in detail.   Smart, Merick Kelleher LCSW 04/18/2016, 3:16 PM

## 2016-04-18 NOTE — Progress Notes (Signed)
Inpatient Diabetes Program Recommendations  AACE/ADA: New Consensus Statement on Inpatient Glycemic Control (2015)  Target Ranges:  Prepandial:   less than 140 mg/dL      Peak postprandial:   less than 180 mg/dL (1-2 hours)      Critically ill patients:  140 - 180 mg/dL   Results for Angel Phillips, Angel Phillips (MRN [id number]) as of 04/18/2016 14:09  Ref. Range 04/17/2016 06:09 04/17/2016 08:29 04/17/2016 08:52 04/17/2016 12:14 04/17/2016 17:16 04/17/2016 21:59  Glucose-Capillary Latest Ref Range: 65-99 mg/dL 71 68 74 [id number] (H) 92 84   Results for Angel Phillips, Angel Phillips (MRN [id number]) as of 04/18/2016 14:09  Ref. Range 04/18/2016 06:22 04/18/2016 11:33 04/18/2016 11:46 04/18/2016 11:54  Glucose-Capillary Latest Ref Range: 65-99 mg/dL 956117 (H) 31 (LL) 47 (L) 72    Admit with: Major Depressive Disorder/ Hypokalemia/ Arm Pain  History: DM, CKD, Cocaine/ ETOH Abuse  Home DM Meds: Glipizide 5 mg bid       Metformin 1000 mg bid  Current Insulin Orders: Glipizide 2.5 mg daily      Metformin 1000 mg bid     -Note patient with Hypoglycemia yesterday morning (CBG 68 mg/dl) and again today at 21HY12pm (CBG 31 mg/dl).  -Was getting Glipizide 5 mg bid.  Glipizide reduced to 2.5 mg daily due to Hypoglycemic events.  Agree.    MD- If patient continues to have issues with Hypoglycemia, may want to stop Glipizide and start Novolog Sensitive Correction Scale/ SSI (0-9 units) TID AC + HS     --Will follow patient during hospitalization--  Ambrose FinlandJeannine Johnston Maricruz Lucero RN, MSN, CDE Diabetes Coordinator Inpatient Glycemic Control Team Team Pager: [phone] (8a-5p)

## 2016-04-18 NOTE — Progress Notes (Signed)
D    Pt is depressed and anxious   He complained about his supper tray and said he dropped it on the floor and the mht said she would warm it up for him   He said he wanted other food    Pt was questioned further and his story changed several times    He uses a wheelchair to get around due to chronic pain  A    Verbal support given   Medications administered and effectiveness monitored   Q 15 min checks    Provided pt a meal that he requested R    Pt is safe and receptive to verbal support

## 2016-04-18 NOTE — Progress Notes (Signed)
D   Pt was up moving around without his walker   His gait is unsteady and he has been repeatedly encouraged to use his wheelchair or walker   Pt said he is trying to go without them and hopes to walk out of here without either one of them    Pt is anxious and depressed but improved from yesterday and was smiling some during the conversation Pt received a snack of sandwich chips and applesauce A    Verbal support and encouragement   Discussed fall issues and safety   Encouraged pt not to overdo trying to walk unassisted    Medications administered and effectiveness monitored    Q 15 min checks R   Pt is safe at present and was receptive to verbal support

## 2016-04-18 NOTE — [facility] Suicide Risk Assessment (Signed)
[facility] INPATIENT:  Family/Significant Other Suicide Prevention Education  Suicide Prevention Education:  Patient Refusal for Family/Significant Other Suicide Prevention Education: The patient Angel Phillips has refused to provide written consent for family/significant other to be provided Family/Significant Other Suicide Prevention Education during admission and/or prior to discharge.  Physician notified.  SPE completed with pt, as pt refused to consent to family contact. SPI pamphlet provided to pt and pt was encouraged to share information with support network, ask questions, and talk about any concerns relating to SPE. Pt denies access to guns/firearms and verbalized understanding of information provided. Mobile Crisis information also provided to pt.   Smart, Nori Winegar LCSW 04/18/2016, 3:16 PM
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
## 2016-04-19 DIAGNOSIS — N183 Chronic kidney disease, stage 3 (moderate): Secondary | ICD-10-CM

## 2016-04-19 DIAGNOSIS — E876 Hypokalemia: Secondary | ICD-10-CM

## 2016-04-19 DIAGNOSIS — E162 Hypoglycemia, unspecified: Secondary | ICD-10-CM

## 2016-04-19 DIAGNOSIS — E1122 Type 2 diabetes mellitus with diabetic chronic kidney disease: Secondary | ICD-10-CM

## 2016-04-19 LAB — GLUCOSE, CAPILLARY
Glucose-Capillary: 244 mg/dL — ABNORMAL HIGH (ref 65–99)
Glucose-Capillary: 152 mg/dL — ABNORMAL HIGH (ref 65–99)

## 2016-04-19 MED ORDER — INSULIN ASPART 100 UNIT/ML ~~LOC~~ SOLN: 0.0000 [IU] | Freq: Every day | SUBCUTANEOUS | Status: DC

## 2016-04-19 MED ORDER — MIRTAZAPINE 15 MG PO TABS
15.0000 mg | ORAL_TABLET | Freq: Every day | ORAL | Status: DC
  Administered 2016-04-19 – 2016-04-22 (×4): 15 mg via ORAL
  Filled 2016-04-19 (×5): qty 1

## 2016-04-19 MED ORDER — INSULIN ASPART 100 UNIT/ML ~~LOC~~ SOLN
0.0000 [IU] | Freq: Three times a day (TID) | SUBCUTANEOUS | Status: DC
  Administered 2016-04-19: 2 [IU] via SUBCUTANEOUS
  Administered 2016-04-20: 3 [IU] via SUBCUTANEOUS
  Administered 2016-04-21 (×2): 2 [IU] via SUBCUTANEOUS
  Administered 2016-04-22: 1 [IU] via SUBCUTANEOUS

## 2016-04-19 MED ORDER — HYDROXYZINE HCL 25 MG PO TABS
25.0000 mg | ORAL_TABLET | Freq: Four times a day (QID) | ORAL | Status: AC | PRN
  Administered 2016-04-19 – 2016-04-20 (×3): 25 mg via ORAL
  Filled 2016-04-19 (×5): qty 1

## 2016-04-19 MED ORDER — INSULIN ASPART 100 UNIT/ML ~~LOC~~ SOLN
3.0000 [IU] | Freq: Three times a day (TID) | SUBCUTANEOUS | Status: DC
  Administered 2016-04-19 – 2016-04-23 (×9): 3 [IU] via SUBCUTANEOUS

## 2016-04-19 NOTE — Progress Notes (Signed)
[facility] PROGRESS NOTE    Progress Note  Angel Phillips  XKG:[id number] DOB: Mar 11, 1961 DOA: 04/16/2016 PCP: [id number] Angel Johns, MD     Brief Narrative:   Angel Phillips is an 55 y.o. male psychiatric disorder that comes in for major depressive disorder we're consulted for hyperkalemia.  Assessment/Plan:   Major depressive disorder, recurrent severe without psychotic features (HCC) Per psychiatry  Hypokalemia: Resolved with repletion, magnesium 2.1.  CKD stage 3 due to type 2 diabetes mellitus (HCC) Likely secondary to diabetes mellitus. Avoid NSAIDs.  Arm pain, musculoskeletal/bilateral arm pain: Cerebral colchicine empirically will continued as an outpatient.  Uncontrolled diabetes mellitus: Had an episode of hypoglycemia, agree with decreasing glipizide continue metformin plus sliding scale insulin. Will follow along with you. Resume glipizide as an outpatient at 2.5 mg daily.  DVT prophylaxis:  Family Communication: Code Status:     Code Status Orders        Start     Ordered   04/16/16 1633  Full code   Continuous     04/16/16 1632    Code Status History    Date Active Date Inactive Code Status Order ID Comments User Context   02/17/2015  4:34 AM 02/20/2015  1:06 AM Full Code [id number]  Angel Harp, MD Inpatient   12/11/2014  7:27 PM 12/14/2014  5:17 PM Full Code [id number]  Angel Katz, PA-C Inpatient   06/11/2013  9:50 AM 06/11/2013 11:28 PM Full Code [id number]  Angel Phillips. Angel Phillips ED   03/12/2013 11:58 AM 03/17/2013  3:58 PM Full Code [id number]  Angel Martinet, NP ED        IV Access:    Peripheral IV   Procedures and diagnostic studies:   No results found.   Medical Consultants:    None.  Anti-Infectives:   none  Subjective:    Angel Phillips no complains, no further hypoglycemia  Objective:    Filed Vitals:   04/18/16 2116 04/19/16 0600 04/19/16 0601 04/19/16 1200  BP: 145/82 171/70 164/78 154/81  Pulse: 91 96 107 96    Temp:  98.3 F (36.8 C)    TempSrc:  Oral    Resp:  22 22   Height:      Weight:      SpO2:       No intake or output data in the 24 hours ending 04/19/16 1258 Filed Weights   04/16/16 1500  Weight: 92.987 kg (205 lb)    Exam: General exam: In no acute distress. Respiratory system: Good air movement and clear to auscultation. Cardiovascular system: S1 & S2 heard, RRR.  Gastrointestinal system: Abdomen is nondistended, soft and nontender.  Central nervous system: Alert and oriented. No focal neurological deficits. Extremities: No pedal edema. Skin: No rashes, lesions or ulcers Psychiatry: Judgement and insight appear normal. Mood & affect appropriate.    Data Reviewed:    Labs: Basic Metabolic Panel:  Recent Labs Lab 04/16/16 0626 04/16/16 0954 04/18/16 0621  NA 137 136 139  K 2.9* 2.9* 4.1  CL 96* 100* 102  CO2 25 24 28   GLUCOSE 144* 173* 117*  BUN 14 15 22*  CREATININE 1.82* 1.56* 1.49*  CALCIUM 8.9 8.1* 8.8*  MG  --   --  2.1   GFR Estimated Creatinine Clearance: 63.4 mL/min (by C-G formula based on Cr of 1.49). Liver Function Tests:  Recent Labs Lab 04/16/16 0626  AST 33  ALT 18  ALKPHOS 110  BILITOT 1.4*  PROT 7.1  ALBUMIN 3.6  No results for input(s): LIPASE, AMYLASE in the last 168 hours. No results for input(s): AMMONIA in the last 168 hours. Coagulation profile No results for input(s): INR, PROTIME in the last 168 hours.  CBC:  Recent Labs Lab 04/16/16 0626  WBC 6.3  HGB 14.4  HCT 42.8  MCV 84.1  PLT 226   Cardiac Enzymes:  Recent Labs Lab 04/17/16 1832  CKTOTAL 53   BNP (last 3 results) No results for input(s): PROBNP in the last 8760 hours. CBG:  Recent Labs Lab 04/18/16 1146 04/18/16 1154 04/18/16 1708 04/18/16 2047 04/19/16 0535  GLUCAP 47* 72 67 108* 244*   D-Dimer: No results for input(s): DDIMER in the last 72 hours. Hgb A1c: No results for input(s): HGBA1C in the last 72 hours. Lipid Profile: No  results for input(s): CHOL, HDL, LDLCALC, TRIG, CHOLHDL, LDLDIRECT in the last 72 hours. Thyroid function studies: No results for input(s): TSH, T4TOTAL, T3FREE, THYROIDAB in the last 72 hours.  Invalid input(s): FREET3 Anemia work up: No results for input(s): VITAMINB12, FOLATE, FERRITIN, TIBC, IRON, RETICCTPCT in the last 72 hours. Sepsis Labs:  Recent Labs Lab 04/16/16 0626  WBC 6.3   Microbiology No results found for this or any previous visit (from the past 240 hour(s)).   Medications:   . amLODipine  10 mg Oral Daily  . chlorthalidone  25 mg Oral Q breakfast  . colchicine  0.6 mg Oral Daily  . diclofenac sodium  2 g Topical QID  . DULoxetine  30 mg Oral Daily  . gabapentin  600 mg Oral TID  . lisinopril  40 mg Oral Daily  . [START ON 04/20/2016] LORazepam  1 mg Oral Daily  . metFORMIN  1,000 mg Oral BID WC  . mirtazapine  15 mg Oral Daily  . multivitamin with minerals  1 tablet Oral Daily  . simvastatin  20 mg Oral QHS  . tamsulosin  0.4 mg Oral Daily  . thiamine  100 mg Intramuscular Once  . thiamine  100 mg Oral Daily   Continuous Infusions:   Time spent: 25 min   LOS: 3 days   Angel Phillips  [facility] Pager [phone]  *Please refer to [email or web address], password [email or web address] to get updated schedule on who will round on this patient, as hospitalists switch teams weekly. If 7PM-7AM, please contact night-coverage at [email or web address], password [email or web address] for any overnight needs.  04/19/2016, 12:58 PM

## 2016-04-19 NOTE — Progress Notes (Signed)
DAR note:Patient reports sleeping well last pm. Reports appetite being good. Rates depression and anxiety at 7 and hopelessness at 6. Writer continuously reminds patient to use wheelchair for ambulation due to unsteady gait. Explained the risks in reference to falls, patient verbalized understanding and continues to walk without DME. Pt reports wants to practice walking so he wont have to use it always. Pt eating meals well. Denies SI, HI, AVH. Presents with flat affect but brightens on approach. Encouragement and support offered. Pt receptive and remains safe on unit with q 15 min checks.

## 2016-04-19 NOTE — Progress Notes (Signed)
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, [employer] and [facility] and that is another reason he came here    He said he was getting pain pills on the street and it was dangerous for him    He said you are not helping me    Explained that pt was receiving non narcotic medications that were effective for pain and that the doctors dont usually prescribe narcotics unless the patient already has a active prescription   Suggested he go to a pain clinic upon discharge   Pt said he wants to be discharged tonight because he can go buy something and be relieved in 30 min   Informed pt he would need to see his doctor tomorrow inorder to be discharged    Pt said give me medications to knock me out so i can sleep    While this writer went to get medication pt got up went to the dayroom to get coffee and was on his way to group   He did not appear to be in any distress then   After group pt apologized for his behavior and said he knows he needs to be here   Pt received bedtime medication and a snack and is currently in bed resting and is safe
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
## 2016-04-19 NOTE — Plan of Care (Signed)
Problem: Self-Concept: Goal: Level of anxiety will decrease Outcome: Progressing Pt reports decrease in anxiety

## 2016-04-20 LAB — GLUCOSE, CAPILLARY
GLUCOSE-CAPILLARY: 105 mg/dL — AB (ref 65–99)
GLUCOSE-CAPILLARY: 143 mg/dL — AB (ref 65–99)
GLUCOSE-CAPILLARY: 101 mg/dL — AB (ref 65–99)
GLUCOSE-CAPILLARY: 222 mg/dL — AB (ref 65–99)
GLUCOSE-CAPILLARY: 94 mg/dL (ref 65–99)

## 2016-04-20 MED ORDER — LORAZEPAM 1 MG PO TABS
ORAL_TABLET | ORAL | Status: AC
  Administered 2016-04-20: 10:00:00
  Filled 2016-04-20: qty 1

## 2016-04-20 MED ORDER — METOPROLOL TARTRATE 25 MG PO TABS
25.0000 mg | ORAL_TABLET | Freq: Two times a day (BID) | ORAL | Status: DC
  Administered 2016-04-20 – 2016-04-23 (×7): 25 mg via ORAL
  Filled 2016-04-20 (×10): qty 1

## 2016-04-20 NOTE — Progress Notes (Signed)
[facility] MD Progress Note  04/20/2016 11:03 AM Angel Phillips  MRN:  [id number]  Subjective: Angel Phillips reports "I am feeling better now that I have spoken to the chaplin ."   Objective : [name] is awake, alert and oriented X4. Seen using a wheelchair for ambulation (safety) Patient seen attending group session. Denies suicidal or homicidal ideation. Denies auditory or visual hallucination and does not appear to be responding to internal stimuli. Patient is still ruminative with his multiple stressors. patient reports feeling a little better. Patient is hopeful to resources to go to the [place]. Patient reports he is trying to get help with disability. Patient reports he is medication compliant without mediation side effects. States his depression 3/10. Reports good appetite other wise and resting well. Support, encouragement and reassurance was provided.   Principal Problem: Major depressive disorder, recurrent severe without psychotic features (HCC) Diagnosis:   Patient Active Problem List   Diagnosis Date Noted  . Hypoglycemia [E16.2]   . CKD stage 3 due to type 2 diabetes mellitus (HCC) [W09.81, N18.3] 04/17/2016  . Arm pain, musculoskeletal [M79.603] 04/17/2016  . Major depressive disorder, recurrent severe without psychotic features (HCC) [F33.2] 04/16/2016  . Cocaine dependence (HCC) [F14.20] 04/16/2016  . Alcohol dependence with uncomplicated withdrawal (HCC) [F10.230] 04/16/2016  . 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 Right upper quadrant pain [R10.11]   . Hypokalemia [E87.6] 02/17/2015  . Abdominal pain [R10.9] 02/17/2015  . Elevated LFTs [R79.89] 02/17/2015  . Hypertension [I10]   . 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 Depression [F32.9]   . Essential hypertension [I10]   . Cholelithiasis without cholecystitis [K80.20]   . Osteoarthritis of left knee [M17.9] 12/11/2014  . Alcohol dependence (HCC) [F10.20] 06/13/2013  . 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 ETOH abuse [F10.10] 03/16/2013  . Depression, major (HCC) [F32.9] 03/16/2013  . Acute encephalopathy [G93.40] 03/12/2013  . Acute respiratory failure (HCC) [J96.00] 03/12/2013  . Metabolic acidosis [E87.2] 03/12/2013  . Lactic acid acidosis [E87.2] 03/12/2013  . Acute renal failure (ARF) (HCC) [N17.9] 03/12/2013  . Sleep apnea (presumed) [G47.30] 03/12/2013  . Hyperglycemia [R73.9] 03/12/2013  . Chronic kidney disease, stage II (mild) [N18.2] 02/27/2013   Total Time spent with patient: 20 minutes    Past Medical History:  Past Medical History:  Diagnosis Date  . Alcohol abuse   . Arthritis   . Chronic kidney disease 02/2013   ACUTE RENAL FAILURE  . Depression   . Diabetes mellitus without complication (HCC)   . HLD (hyperlipidemia)   . Hypertension   . OSA (obstructive sleep apnea)   . PONV (postoperative nausea and vomiting)   . Tuberculosis    tb positive 1989 ?     Past Surgical History:  Procedure Laterality Date  . GASTRIC BYPASS    . KNEE ARTHROSCOPY    . SPINAL FUSION     Harrington Rod  . 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 Diabetes Mother   . Hypertension Mother   . Colon cancer Mother   . Stomach cancer Father     Social History:  History  Alcohol Use  . Yes    Comment: no longer drinks alcohol"     History  Drug Use    Comment: Currently using Crack, "As much as  I can" since April of 2017    Social History   Social History  . Marital status: Divorced    Spouse name: N/A  . Number of children: N/A  . Years of education: N/A   Social History Main Topics  . Smoking status: Never Smoker  . Smokeless tobacco: Never Used  . Alcohol use Yes     Comment: no longer drinks alcohol"  . Drug use:      Comment: Currently using Crack, "As  much as I can" since April of 2017  . Sexual activity: Not Asked   Other Topics Concern  . None   Social History Narrative  . None   Additional Social History:   Sleep: improved   Appetite:  Fair  Current Medications: Current Facility-Administered Medications  Medication Dose Route Frequency Provider Last Rate Last Dose  . acetaminophen (TYLENOL) tablet 650 mg  650 mg Oral Q6H PRN Charm Rings, NP   650 mg at 04/20/16 0846  . alum & mag hydroxide-simeth (MAALOX/MYLANTA) 200-200-20 MG/5ML suspension 30 mL  30 mL Oral Q4H PRN Charm Rings, NP      . amLODipine (NORVASC) tablet 10 mg  10 mg Oral Daily Charm Rings, NP   10 mg at 04/20/16 1610  . chlorthalidone (HYGROTON) tablet 25 mg  25 mg Oral Q breakfast Charm Rings, NP   25 mg at 04/20/16 9604  . colchicine tablet 0.6 mg  0.6 mg Oral Daily Clydia Llano, MD   0.6 mg at 04/20/16 0839  . diclofenac sodium (VOLTAREN) 1 % transdermal gel 2 g  2 g Topical QID Adonis Brook, NP   2 g at 04/20/16 0839  . DULoxetine (CYMBALTA) DR capsule 30 mg  30 mg Oral Daily Craige Cotta, MD   30 mg at 04/20/16 0839  . feeding supplement (GLUCERNA SHAKE) (GLUCERNA SHAKE) liquid 237 mL  237 mL Oral Daily PRN Rockey Situ Cobos, MD      . gabapentin (NEURONTIN) capsule 600 mg  600 mg Oral TID Adonis Brook, NP   600 mg at 04/20/16 0840  . hydrOXYzine (ATARAX/VISTARIL) tablet 25 mg  25 mg Oral Q6H PRN Craige Cotta, MD   25 mg at 04/20/16 0845  . insulin aspart (novoLOG) injection 0-5 Units  0-5 Units Subcutaneous QHS Marinda Elk, MD      . insulin aspart (novoLOG) injection 0-9 Units  0-9 Units Subcutaneous TID WC Marinda Elk, MD   2 Units at 04/19/16 1815  . insulin aspart (novoLOG) injection 3 Units  3 Units Subcutaneous TID WC Marinda Elk, MD   3 Units at 04/19/16 1814  . lisinopril (PRINIVIL,ZESTRIL) tablet 40 mg  40 mg Oral Daily Charm Rings, NP   40 mg at 04/20/16 0840  . magnesium hydroxide (MILK OF MAGNESIA)  suspension 30 mL  30 mL Oral Daily PRN Charm Rings, NP      . metFORMIN (GLUCOPHAGE) tablet 1,000 mg  1,000 mg Oral BID WC Charm Rings, NP   1,000 mg at 04/20/16 0840  . metoprolol tartrate (LOPRESSOR) tablet 25 mg  25 mg Oral BID Marinda Elk, MD   25 mg at 04/20/16 1020  . mirtazapine (REMERON) tablet 15 mg  15 mg Oral QHS Craige Cotta, MD   15 mg at 04/19/16 1959  . multivitamin with minerals tablet 1 tablet  1 tablet Oral Daily Charm Rings, NP   1 tablet at 04/20/16 [id number]  .  simvastatin (ZOCOR) tablet 20 mg  20 mg Oral QHS Charm Rings, NP   20 mg at 04/19/16 2000  . tamsulosin (FLOMAX) capsule 0.4 mg  0.4 mg Oral Daily Charm Rings, NP   0.4 mg at 04/20/16 0841  . thiamine (B-1) injection 100 mg  100 mg Intramuscular Once Charm Rings, NP      . thiamine (VITAMIN B-1) tablet 100 mg  100 mg Oral Daily Charm Rings, NP   100 mg at 04/20/16 6195    Lab Results:  Results for orders placed or performed during the hospital encounter of 04/16/16 (from the past 48 hour(s))  Glucose, capillary     Status: Abnormal   Collection Time: 04/18/16 11:33 AM  Result Value Ref Range   Glucose-Capillary 31 (LL) 65 - 99 mg/dL   Comment 1 Notify RN    Comment 2 Document in Chart   Glucose, capillary     Status: Abnormal   Collection Time: 04/18/16 11:46 AM  Result Value Ref Range   Glucose-Capillary 47 (L) 65 - 99 mg/dL   Comment 1 Notify RN    Comment 2 Document in Chart    Comment 3 Repeat Test   Glucose, capillary     Status: None   Collection Time: 04/18/16 11:54 AM  Result Value Ref Range   Glucose-Capillary 72 65 - 99 mg/dL  Glucose, capillary     Status: None   Collection Time: 04/18/16  5:08 PM  Result Value Ref Range   Glucose-Capillary 67 65 - 99 mg/dL   Comment 1 Notify RN    Comment 2 Document in Chart   Glucose, capillary     Status: Abnormal   Collection Time: 04/18/16  8:47 PM  Result Value Ref Range   Glucose-Capillary 108 (H) 65 - 99 mg/dL   Comment  1 Notify RN    Comment 2 Document in Chart   Glucose, capillary     Status: Abnormal   Collection Time: 04/19/16  5:35 AM  Result Value Ref Range   Glucose-Capillary 244 (H) 65 - 99 mg/dL  Glucose, capillary     Status: Abnormal   Collection Time: 04/19/16  5:20 PM  Result Value Ref Range   Glucose-Capillary 152 (H) 65 - 99 mg/dL  Glucose, capillary     Status: Abnormal   Collection Time: 04/19/16  9:00 PM  Result Value Ref Range   Glucose-Capillary 105 (H) 65 - 99 mg/dL   Comment 1 Notify RN    Comment 2 Document in Chart   Glucose, capillary     Status: Abnormal   Collection Time: 04/20/16  5:45 AM  Result Value Ref Range   Glucose-Capillary 143 (H) 65 - 99 mg/dL    Blood Alcohol level:  Lab Results  Component Value Date   ETH 169 (H) 04/16/2016   ETH <5 01/24/2016    Metabolic Disorder Labs: Lab Results  Component Value Date   HGBA1C 10.8 (H) 12/11/2014   MPG 263 12/11/2014   MPG 177 (H) 03/14/2013   No results found for: PROLACTIN Lab Results  Component Value Date   CHOL 174 02/17/2015   TRIG 69 02/17/2015   HDL 51 02/17/2015   CHOLHDL 3.4 02/17/2015   VLDL 14 02/17/2015   LDLCALC 109 (H) 02/17/2015    Physical Findings: AIMS: Facial and Oral Movements Muscles of Facial Expression: None, normal Lips and Perioral Area: None, normal Jaw: None, normal Tongue: None, normal,Extremity Movements Upper (arms, wrists, hands, fingers): None,  normal Lower (legs, knees, ankles, toes): None, normal, Trunk Movements Neck, shoulders, hips: None, normal, Overall Severity Severity of abnormal movements (highest score from questions above): None, normal Incapacitation due to abnormal movements: None, normal Patient's awareness of abnormal movements (rate only patient's report): No Awareness, Dental Status Current problems with teeth and/or dentures?: No Does patient usually wear dentures?: No  CIWA:  CIWA-Ar Total: 0 COWS:     Musculoskeletal: Strength & Muscle  Tone: mobilizes in wheel chair due to leg pain  Gait & Station: as above  Patient leans: N/A  Psychiatric Specialty Exam: Physical Exam  Nursing note and vitals reviewed. Constitutional: He appears well-developed.  HENT:  Head: Normocephalic.  Neurological: He is alert.  Psychiatric: He has a normal mood and affect. His behavior is normal.    Review of Systems  Musculoskeletal: Positive for joint pain.       .bilateral hand pain and weakness, mainly in area of thumbs bilaterally and knees  Psychiatric/Behavioral: Positive for depression. Negative for hallucinations and suicidal ideas. The patient is nervous/anxious.   All other systems reviewed and are negative.  no chest pain, no shortness of breath, chronic pain, describes recent onset of pain and weakness bilaterally on thumbs wrists.  Blood pressure (!) 156/93, pulse (!) 108, temperature 98.9 F (37.2 C), temperature source Oral, resp. rate 20, height 5\' 10"  (1.778 m), weight 93 kg (205 lb), SpO2 100 %.Body mass index is 29.41 kg/m.  General Appearance: Fairly Groomed  Eye Contact:  Good  Speech:  Normal Rate  Volume:  Decreased  Mood:  remains depressed   Affect:  Appropriate and Congruent  Thought Process:  Linear  Orientation:  Other:  fully alert and attentive   Thought Content:  no hallucinations, no delusions, not internally preoccupied at this time   Suicidal Thoughts:  No today denies any suicidal plan or intentioncontracts for safety on the unit   Homicidal Thoughts:  No denies homicidal ideations   Memory:  recent and remote grossly intact   Judgement:  Fair  Insight:  Fair  Psychomotor Activity:  Decreased- no current tremors, no psychomotor agitation   Concentration:  Concentration: Good and Attention Span: Good  Recall:  NA  Fund of Knowledge:  Good  Language:  Good  Akathisia:  Negative  Handed:  Right  AIMS (if indicated):     Assets:  Desire for Improvement Resilience  ADL's:  Fair   Cognition:  WNL   Sleep:  Number of Hours: 5    I agree with current treatment plan on 04/20/2016, Patient seen face-to-face for psychiatric evaluation follow-up, chart reviewed. Reviewed the information documented and agree with the treatment plan. Of Note: See Internal Medicine Consult.  Treatment Plan Summary: Daily contact with patient to assess and evaluate symptoms and progress in treatment, Medication management, Plan inpatient treatment  and continue medications as below   Encourage improved group, milieu participation to work on coping skills and symptom reduction  Continue to encourage efforts to work on and maintain sobriety, relapse prevention efforts  Continue Ativan detox protocol to minimize risk of alcohol WDL Continue Remeron to 15 mgrs QHS for depression PTSD symptoms, improve sleep Continue Neurontin 600 mgrs TID for pain , peripheral neuropathy, and for anxiety Continue Cymbalta 30 mgrs QDAY for depression, anxiety, PTSD symptoms Had episode of hypoglycemia earlier today- Appreciate Hospitalist involvement in patient's care - as discussed with Dr. Arthor Captain, recommendation is to stop Glypizide, but continue Metformin. They will continue to follow patient for ongoing  recommendations. have requested Diabetic Care Consult as well    Oneta Rack, NP 04/20/2016, 11:03 AM    Reviewed the information documented and agree with the treatment plan.  Morgane Joerger 04/20/2016 11:51 AM

## 2016-04-20 NOTE — [facility] Group Notes (Signed)
[facility] Group Notes:  (Clinical Social Work)  04/20/2016  10:00-11:00AM  Summary of Progress/Problems:   The main focus of today's process group was to   1)  identify the patient's current unhealthy supports and plan how to handle them  2)  Identify the patient's current healthy supports and plan what to add.   An emphasis was placed on using counselor, doctor, therapy groups, 12-step groups, and problem-specific support groups to expand supports.    The patient expressed full comprehension of the concepts presented, and agreed that there is a need to add more supports.  The patient stated his healthy supports include family and friends, while "street kids" are unhealthy for him.  He later talked at length about some people in his sober world not liking his recent decision to move to where he can get more supports in place, because he will be leaving their healthy support.  Type of Therapy:  Process Group with Motivational Interviewing  Participation Level:  Active  Participation Quality:  Attentive and Sharing  Affect:  Blunted  Cognitive:  Appropriate  Insight:  Engaged  Engagement in Therapy:  Engaged  Modes of Intervention:   Education, Teacher, English as a foreign language, Activity  Ambrose Mantle, LCSW 04/20/2016

## 2016-04-20 NOTE — Progress Notes (Signed)
Patient did attend the evening speaker AA meeting.  

## 2016-04-20 NOTE — Plan of Care (Signed)
Problem: Education: Goal: Utilization of techniques to improve thought processes will improve Outcome: Progressing Nurse discussed depression/coping skills with patient.    
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
## 2016-04-20 NOTE — [facility] Group Notes (Signed)

## 2016-04-21 LAB — GLUCOSE, CAPILLARY
GLUCOSE-CAPILLARY: 89 mg/dL (ref 65–99)
Glucose-Capillary: 86 mg/dL (ref 65–99)
Glucose-Capillary: 157 mg/dL — ABNORMAL HIGH (ref 65–99)
Glucose-Capillary: 169 mg/dL — ABNORMAL HIGH (ref 65–99)
Glucose-Capillary: 138 mg/dL — ABNORMAL HIGH (ref 65–99)

## 2016-04-21 MED ORDER — HYDROXYZINE HCL 50 MG PO TABS
50.0000 mg | ORAL_TABLET | Freq: Every evening | ORAL | Status: DC | PRN
  Administered 2016-04-21 – 2016-04-22 (×2): 50 mg via ORAL
  Filled 2016-04-21: qty 1
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
## 2016-04-21 NOTE — Progress Notes (Signed)
D: Pt endorsed severe anxiety, depression and generalized body pain; states, "I have been evicted from where I live and I have these pain that no one is doing nothing about; All I get is Tylenol-what will hat do?" Pt however denied SI, HI and AVH; "I understand that I have to live with these pain for the rest of my life; I have to investigate other ways to dealing with them like take a hot bath; I just too one but I don't see any relief." Pt remained calm and cooperative.  A: Medications offered as prescribed.  Support, encouragement, and safe environment provided.  15-minute safety checks continue.  R: Pt was med compliant.  Pt attended AA group meeting. Safety checks continue.

## 2016-04-21 NOTE — Progress Notes (Addendum)
Patient ID: Angel Phillips, male   DOB: September 14, 1961, 55 y.o.   MRN: [id number] [facility] MD Progress Note  04/21/2016 1:35 PM Angel Phillips  MRN:  [id number]  Subjective: Angel Phillips reports, "I'm not feeling well right now. My knees are killing me. The pain is outrageous. When I hurt like this, it affects my mood. I'm desperate right now about getting the help that I need To help me figure things out". Rates depression #6 on the scale of 1-10.  Objective : Angel Phillips is seen, chart reviewed. He is awake, alert and oriented X 4. He is currently complaining of knee pain. He is using a wheelchair for ambulation (safety) to get around the unit. He is attending participating in the group session, AA/NA meetings being provided & held on the unit. Denies suicidal or homicidal ideation at this time.  Denies auditory or visual hallucination and does not appear to be responding to internal stimuli. Patient is still ruminative with his multiple stressors about getting to the [place], finding a place to live & paying his rent. However, he is hopeful that the resources from the [place] will help him. Patient reports he is trying to get help with disability. Patient reports he is medication compliant without mediation side effects. States his depression 6/10. Reports good appetite. Support, encouragement and reassurance was provided.   Principal Problem: Major depressive disorder, recurrent severe without psychotic features (HCC)  Diagnosis:   Patient Active Problem List   Diagnosis Date Noted  . Hypoglycemia [E16.2]   . CKD stage 3 due to type 2 diabetes mellitus (HCC) [N82.95, N18.3] 04/17/2016  . Arm pain, musculoskeletal [M79.603] 04/17/2016  . Major depressive disorder, recurrent severe without psychotic features (HCC) [F33.2] 04/16/2016  . Cocaine dependence (HCC) [F14.20] 04/16/2016  . Alcohol dependence with uncomplicated withdrawal (HCC) [F10.230] 04/16/2016  . 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 Right upper quadrant pain  [R10.11]   . Hypokalemia [E87.6] 02/17/2015  . Abdominal pain [R10.9] 02/17/2015  . Elevated LFTs [R79.89] 02/17/2015  . Hypertension [I10]   . 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 Depression [F32.9]   . Essential hypertension [I10]   . Cholelithiasis without cholecystitis [K80.20]   . Osteoarthritis of left knee [M17.9] 12/11/2014  . Alcohol dependence (HCC) [F10.20] 06/13/2013  . 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 ETOH abuse [F10.10] 03/16/2013  . Depression, major (HCC) [F32.9] 03/16/2013  . Acute encephalopathy [G93.40] 03/12/2013  . Acute respiratory failure (HCC) [J96.00] 03/12/2013  . Metabolic acidosis [E87.2] 03/12/2013  . Lactic acid acidosis [E87.2] 03/12/2013  . Acute renal failure (ARF) (HCC) [N17.9] 03/12/2013  . Sleep apnea (presumed) [G47.30] 03/12/2013  . Hyperglycemia [R73.9] 03/12/2013  . Chronic kidney disease, stage II (mild) [N18.2] 02/27/2013   Total Time spent with patient: 15 minutes  Past Medical History:  Past Medical History:  Diagnosis Date  . Alcohol abuse   . Arthritis   . Chronic kidney disease 02/2013   ACUTE RENAL FAILURE  . Depression   . Diabetes mellitus without complication (HCC)   . HLD (hyperlipidemia)   . Hypertension   . OSA (obstructive sleep apnea)   . PONV (postoperative nausea and vomiting)   . Tuberculosis    tb positive 1989 ?     Past Surgical History:  Procedure Laterality Date  . GASTRIC BYPASS    . KNEE ARTHROSCOPY    . SPINAL FUSION     Harrington Rod  . 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 Diabetes Mother   . Hypertension Mother   . Colon cancer Mother   . Stomach cancer Father    Social History:  History  Alcohol Use  . Yes    Comment: no longer drinks alcohol"     History   Drug Use    Comment: Currently using Crack, "As much as I can" since April of 2017    Social History   Social History  . Marital status: Divorced    Spouse name: N/A  . Number of children: N/A  . Years of education: N/A   Social History Main Topics  . Smoking status: Never Smoker  . Smokeless tobacco: Never Used  . Alcohol use Yes     Comment: no longer drinks alcohol"  . Drug use:      Comment: Currently using Crack, "As much as I can" since April of 2017  . Sexual activity: Not Asked   Other Topics Concern  . None   Social History Narrative  . None   Additional Social History:   Sleep: improving  Appetite:  Fair  Current Medications: Current Facility-Administered Medications  Medication Dose Route Frequency Provider Last Rate Last Dose  . acetaminophen (TYLENOL) tablet 650 mg  650 mg Oral Q6H PRN Charm Rings, NP   650 mg at 04/20/16 2144  . alum & mag hydroxide-simeth (MAALOX/MYLANTA) 200-200-20 MG/5ML suspension 30 mL  30 mL Oral Q4H PRN Charm Rings, NP      . amLODipine (NORVASC) tablet 10 mg  10 mg Oral Daily Charm Rings, NP   10 mg at 04/21/16 0803  . chlorthalidone (HYGROTON) tablet 25 mg  25 mg Oral Q breakfast Charm Rings, NP   25 mg at 04/21/16 0803  . colchicine tablet 0.6 mg  0.6 mg Oral Daily Clydia Llano, MD   0.6 mg at 04/21/16 0803  . diclofenac sodium (VOLTAREN) 1 % transdermal gel 2 g  2 g Topical QID Adonis Brook, NP   2 g at 04/21/16 1155  . DULoxetine (CYMBALTA) DR capsule 30 mg  30 mg Oral Daily Craige Cotta, MD   30 mg at 04/21/16 0804  . feeding supplement (GLUCERNA SHAKE) (GLUCERNA SHAKE) liquid 237 mL  237 mL Oral Daily PRN Rockey Situ Cobos, MD      . gabapentin (NEURONTIN) capsule 600 mg  600 mg Oral TID Adonis Brook, NP   600 mg at 04/21/16 1154  . hydrOXYzine (ATARAX/VISTARIL) tablet 25 mg  25 mg Oral Q6H PRN Craige Cotta, MD   25 mg at 04/20/16 2144  . insulin aspart (novoLOG) injection 0-5 Units  0-5 Units  Subcutaneous QHS Marinda Elk, MD      . insulin aspart (novoLOG) injection 0-9 Units  0-9 Units Subcutaneous TID WC Marinda Elk, MD   2 Units at 04/21/16 1221  . insulin aspart (novoLOG) injection 3 Units  3 Units Subcutaneous TID WC Marinda Elk, MD   3 Units at 04/21/16 1226  . lisinopril (PRINIVIL,ZESTRIL) tablet 40 mg  40 mg Oral Daily Charm Rings, NP   40 mg at 04/21/16 0805  . magnesium hydroxide (MILK OF MAGNESIA) suspension 30 mL  30 mL Oral Daily PRN Charm Rings, NP      . metFORMIN (GLUCOPHAGE) tablet 1,000 mg  1,000 mg Oral  BID WC Charm Rings, NP   1,000 mg at 04/21/16 0858  . metoprolol tartrate (LOPRESSOR) tablet 25 mg  25 mg Oral BID Marinda Elk, MD   25 mg at 04/21/16 0805  . mirtazapine (REMERON) tablet 15 mg  15 mg Oral QHS Craige Cotta, MD   15 mg at 04/20/16 2145  . multivitamin with minerals tablet 1 tablet  1 tablet Oral Daily Charm Rings, NP   1 tablet at 04/21/16 0805  . simvastatin (ZOCOR) tablet 20 mg  20 mg Oral QHS Charm Rings, NP   20 mg at 04/20/16 2145  . tamsulosin (FLOMAX) capsule 0.4 mg  0.4 mg Oral Daily Charm Rings, NP   0.4 mg at 04/21/16 0805  . thiamine (B-1) injection 100 mg  100 mg Intramuscular Once Charm Rings, NP      . thiamine (VITAMIN B-1) tablet 100 mg  100 mg Oral Daily Charm Rings, NP   100 mg at 04/21/16 0211   Lab Results:  Results for orders placed or performed during the hospital encounter of 04/16/16 (from the past 48 hour(s))  Glucose, capillary     Status: Abnormal   Collection Time: 04/19/16  5:20 PM  Result Value Ref Range   Glucose-Capillary 152 (H) 65 - 99 mg/dL  Glucose, capillary     Status: Abnormal   Collection Time: 04/19/16  9:00 PM  Result Value Ref Range   Glucose-Capillary 105 (H) 65 - 99 mg/dL   Comment 1 Notify RN    Comment 2 Document in Chart   Glucose, capillary     Status: Abnormal   Collection Time: 04/20/16  5:45 AM  Result Value Ref Range    Glucose-Capillary 143 (H) 65 - 99 mg/dL  Glucose, capillary     Status: Abnormal   Collection Time: 04/20/16 12:04 PM  Result Value Ref Range   Glucose-Capillary 101 (H) 65 - 99 mg/dL   Comment 1 Notify RN   Glucose, capillary     Status: Abnormal   Collection Time: 04/20/16  4:56 PM  Result Value Ref Range   Glucose-Capillary 222 (H) 65 - 99 mg/dL  Glucose, capillary     Status: None   Collection Time: 04/20/16  8:59 PM  Result Value Ref Range   Glucose-Capillary 94 65 - 99 mg/dL   Comment 1 Notify RN    Comment 2 Document in Chart   Glucose, capillary     Status: None   Collection Time: 04/21/16  5:39 AM  Result Value Ref Range   Glucose-Capillary 86 65 - 99 mg/dL   Comment 1 Notify RN   Glucose, capillary     Status: Abnormal   Collection Time: 04/21/16 11:54 AM  Result Value Ref Range   Glucose-Capillary 157 (H) 65 - 99 mg/dL   Comment 1 Notify RN    Blood Alcohol level:  Lab Results  Component Value Date   ETH 169 (H) 04/16/2016   ETH <5 01/24/2016   Metabolic Disorder Labs: Lab Results  Component Value Date   HGBA1C 10.8 (H) 12/11/2014   MPG 263 12/11/2014   MPG 177 (H) 03/14/2013   No results found for: PROLACTIN Lab Results  Component Value Date   CHOL 174 02/17/2015   TRIG 69 02/17/2015   HDL 51 02/17/2015   CHOLHDL 3.4 02/17/2015   VLDL 14 02/17/2015   LDLCALC 109 (H) 02/17/2015   Physical Findings: AIMS: Facial and Oral Movements Muscles of Facial Expression:  None, normal Lips and Perioral Area: None, normal Jaw: None, normal Tongue: None, normal,Extremity Movements Upper (arms, wrists, hands, fingers): None, normal Lower (legs, knees, ankles, toes): None, normal, Trunk Movements Neck, shoulders, hips: None, normal, Overall Severity Severity of abnormal movements (highest score from questions above): None, normal Incapacitation due to abnormal movements: None, normal Patient's awareness of abnormal movements (rate only patient's report): No  Awareness, Dental Status Current problems with teeth and/or dentures?: No Does patient usually wear dentures?: No  CIWA:  CIWA-Ar Total: 4 COWS:  COWS Total Score: 2  Musculoskeletal: Strength & Muscle Tone: mobilizes in wheel chair due to leg pain  Gait & Station: as above  Patient leans: N/A  Psychiatric Specialty Exam: Physical Exam  Nursing note and vitals reviewed. Constitutional: He appears well-developed.  HENT:  Head: Normocephalic.  Neurological: He is alert.  Psychiatric: He has a normal mood and affect. His behavior is normal.    Review of Systems  Constitutional: Negative.   HENT: Negative.   Eyes: Negative.   Respiratory: Negative.   Cardiovascular: Negative.   Gastrointestinal: Negative.   Genitourinary: Negative.   Musculoskeletal: Positive for joint pain and myalgias.       .bilateral hand pain and weakness, mainly in area of thumbs bilaterally and knees  Skin: Negative.   Neurological: Negative.   Endo/Heme/Allergies: Negative.   Psychiatric/Behavioral: Positive for depression and substance abuse (Hx. alcohol dependence). Negative for hallucinations, memory loss and suicidal ideas. The patient is nervous/anxious and has insomnia ("Improving").   All other systems reviewed and are negative.  no chest pain, no shortness of breath, chronic pain, describes recent onset of pain and weakness bilaterally on thumbs wrists.  Blood pressure 136/83, pulse 100, temperature 98.6 F (37 C), resp. rate 20, height 5\' 10"  (1.778 m), weight 93 kg (205 lb), SpO2 100 %.Body mass index is 29.41 kg/m.  General Appearance: Fairly Groomed  Eye Contact:  Good  Speech:  Normal Rate  Volume:  Decreased  Mood:  remains depressed   Affect:  Appropriate and Congruent  Thought Process:  Linear  Orientation:  Other:  fully alert and attentive   Thought Content:  no hallucinations, no delusions, not internally preoccupied at this time   Suicidal Thoughts:  No today denies any suicidal  plan or intentioncontracts for safety on the unit   Homicidal Thoughts:  No denies homicidal ideations   Memory:  recent and remote grossly intact   Judgement:  Fair  Insight:  Fair  Psychomotor Activity:  Decreased- no current tremors, no psychomotor agitation   Concentration:  Concentration: Good and Attention Span: Good  Recall:  NA  Fund of Knowledge:  Good  Language:  Good  Akathisia:  Negative  Handed:  Right  AIMS (if indicated):     Assets:  Desire for Improvement Resilience  ADL's:  Fair   Cognition:  WNL  Sleep:  Number of Hours: 4    I agree with current treatment plan on 04/20/2016, Patient seen face-to-face for psychiatric evaluation follow-up, chart reviewed. Reviewed the information documented and agree with the treatment plan. Of Note: See Internal Medicine Consult.  Treatment Plan Summary: Daily contact with patient to assess and evaluate symptoms and progress in treatment and Medication management  Encourage improved group, milieu participation to work on coping skills and symptom reduction  Continue to encourage efforts to work on and maintain sobriety, relapse prevention efforts encouraged.  Completed Ativan detox protocols for alcohol WDL Continue Remeron 15 mgrs QHS for depression PTSD  symptoms, improve sleep Continue Neurontin 600 mgrs TID for pain , peripheral neuropathy, and for anxiety Continue Cymbalta 30 mgrs QDAY for depression, anxiety, PTSD symptoms  Sanjuana Kava, NP, PMHNP, FNP-BC 04/21/2016, 1:35 PM

## 2016-04-21 NOTE — Progress Notes (Signed)
Patient ID: Angel Phillips, male   DOB: Oct 23, 1960, 55 y.o.   MRN: [id number] PER STATE REGULATIONS 482.30  THIS CHART WAS REVIEWED FOR MEDICAL NECESSITY WITH RESPECT TO THE PATIENT'S ADMISSION/ DURATION OF STAY.  NEXT REVIEW DATE: 04/24/2016  Willa Rough, RN, BSN CASE MANAGER

## 2016-04-21 NOTE — Progress Notes (Signed)
CSW and pt met individually to discuss aftercare plan. Pt plans to discharge to [facility] "and stay in a hotel" in order to take care of necessary paperwork to file for [employer] disability services. CSW reached out to his social worker through the [facility] [phone]) and she is working to schedule him a follow-up PCP visit with Dr. Arletha Grippe. She also asked that CSW provide pt with her contact information to continue working with him while he is inpatient. Patient continues to go back and forth with his plans. Per his request, pt was also provided with comprehensive list of low income housing/apts in [place], [place] and [place], [place]. Pt has been actively working with CSW on aftercare plan.  Maxie Better, MSW, LCSW Clinical Social Worker 04/21/2016 3:55 PM

## 2016-04-21 NOTE — Progress Notes (Signed)
Recreation Therapy Notes  Date: 04/21/16 Time: 0930 Location: 300 Hall Group Room  Group Topic: Stress Management  Goal Area(s) Addresses:  Patient will verbalize importance of using healthy stress management.  Patient will identify positive emotions associated with healthy stress management.   Behavioral Response: Engaged  Intervention: Stress Management  Activity :  Guided Imagery.  LRT introduced and educated patients on stress management technique of guided imagery.  A script was used to deliver the technique to patients.  Patients were asked to follow the script read allowed by LRT to engage in practicing the stress management technique.  Education:  Stress Management, Discharge Planning.   Education Outcome: Acknowledges edcuation/In group clarification offered/Needs additional education  Clinical Observations/Feedback: Pt was engaged throughout the activity.  Pt stated that the activity made him feel "50% better".  Caroll Rancher, LRT/CTRS       Lillia Abed, Tasman Zapata A 04/21/2016 1:28 PM

## 2016-04-21 NOTE — Progress Notes (Signed)
Pt attended evening AA group. 

## 2016-04-21 NOTE — Progress Notes (Addendum)
D:  Patient's self inventory sheet, patient has poor sleep, sleep medication is not helpful.  Rated depression and anxiety 7, hopeless 3.  Withdrawals, agitation, irritability.  Denied SI.  Physical problems, pain, headaches.  Physical pain, left shoulder, knees and hands, worst pain in past 24 hrs is #7.  Goal is finding place to live in [place], learn to live with pain and remain sober.  Plans to talk to SW, call family and stay positive.  Needs to get paperwork to VA to restore funds.  No discharge plans.   A:  Medications administered per MD orders.  Emotional support and encouragement given patient. R:  Denied SI and HI, contracts for safety.  Denied A/V hallucinations.  Safety maintained with 15 minute checks.  Information printouts given patient on medications that he is taking at [facility].

## 2016-04-21 NOTE — [facility] Group Notes (Signed)
[facility] LCSW Group Therapy  04/21/2016 4:06 PM  Type of Therapy:  Group Therapy  Participation Level:  Active  Participation Quality:  Attentive  Affect:  Appropriate  Cognitive:  Alert and Oriented  Insight:  Improving  Engagement in Therapy:  Improving  Modes of Intervention:  Discussion, Education, Exploration, Problem-solving, Rapport Building, Socialization and Support  Summary of Progress/Problems: Today's Topic: Overcoming Obstacles. Patients identified one short term goal and potential obstacles in reaching this goal. Patients processed barriers involved in overcoming these obstacles. Patients identified steps necessary for overcoming these obstacles and explored motivation (internal and external) for facing these difficulties head on. Angel Phillips was attentive and engaged during today's processing group. He shared that his biggest obstacle "has to do with housing. I am being evicted and need to find a safe place asap." Patient was receptive to working with CSW and the [place] to find longer term housing while making sure he is able to have access to outpatient primary care and substance abuse treatment. He continues to show progress and high motivation for recovery. He continues to show progress in the group setting.   Smart, Annabella Elford LCSW 04/21/2016, 4:06 PM

## 2016-04-22 DIAGNOSIS — F4310 Post-traumatic stress disorder, unspecified: Secondary | ICD-10-CM | POA: Clinically undetermined

## 2016-04-22 LAB — GLUCOSE, CAPILLARY
Glucose-Capillary: 107 mg/dL — ABNORMAL HIGH (ref 65–99)
Glucose-Capillary: 90 mg/dL (ref 65–99)
Glucose-Capillary: 121 mg/dL — ABNORMAL HIGH (ref 65–99)
Glucose-Capillary: 81 mg/dL (ref 65–99)

## 2016-04-22 MED ORDER — DULOXETINE HCL 60 MG PO CPEP
60.0000 mg | ORAL_CAPSULE | Freq: Every day | ORAL | Status: DC
  Administered 2016-04-23: 60 mg via ORAL
  Filled 2016-04-22 (×2): qty 1

## 2016-04-22 MED ORDER — GABAPENTIN 300 MG PO CAPS
300.0000 mg | ORAL_CAPSULE | Freq: Every day | ORAL | Status: DC
  Administered 2016-04-22: 300 mg via ORAL
  Filled 2016-04-22 (×3): qty 1

## 2016-04-22 NOTE — Progress Notes (Signed)
D: Angel Phillips has been polite, calm, and appropriate on the unit today. He has been visible in the milieu and interacted appropriately with peers. At the beginning of the shift, he was worried that he was being evicted and that his belongings were being deposited on the street today, but he learned by phone that this was not the case. Since then, he has been much calmer. He is hopeful he will be discharged tomorrow. On his self inventory sheet, he reported fair sleep, good appetite, normal energy level, and good concentration. He rated his depression, feelings of hopelessness, and anxiety all a 7/10. He cited feelings of anger and irritability, but he appeared to manage the well -- he was appropriate in all interactions.  A: Meds given as ordered. Q15 safety checks maintained. Support/encouragement offered. R: Pt remains free from harm and continues with treatment. Will continue to monitor for needs/safety.

## 2016-04-22 NOTE — Progress Notes (Signed)
D: Cord rates Anxiety 7/10 and Depression 4/10. His goal today is to "dc home soon". Denies SI/HI/AVH at this time. Contracts for safety.  A: Encouragement and support given. Q15 minute room checks for patient safety. Medications administered as prescribed.  R: Continue to monitor for patient safety and medication effectiveness.

## 2016-04-22 NOTE — Progress Notes (Signed)
Pt attended evening AA group. 

## 2016-04-22 NOTE — Progress Notes (Signed)
D: Pt endorsed severe anxiety, depression and Left shoulder pain; states, "My eviction and every other thing happening to me like this pain will make anyone feel this way." Pt denied SI, HI or AVH. Pt remained calm and cooperative. A: Medications offered as prescribed.  Support, encouragement, and safe environment provided.  15-minute safety checks continue. R: Pt was med compliant.  Pt attended AA group meeting. Safety checks continue.

## 2016-04-22 NOTE — [facility] Group Notes (Signed)
[facility] LCSW Group Therapy  04/22/2016 3:18 PM  Type of Therapy:  Group Therapy  Participation Level:  Active  Participation Quality:  Attentive  Affect:  Appropriate  Cognitive:  Alert and Oriented  Insight:  Engaged  Engagement in Therapy:  Improving  Modes of Intervention:  Discussion, Education, Exploration, Problem-solving, Rapport Building, Socialization and Support  Summary of Progress/Problems: [facility]HA Speaker came to talk about his personal journey with substance abuse and addiction. The pt processed ways by which to relate to the speaker. MHA speaker provided handouts and educational information pertaining to groups and services offered by the [facility].   Smart, Camaron Cammack LCSW 04/22/2016, 3:18 PM
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
## 2016-04-22 NOTE — [facility] Group Notes (Signed)
[facility] Group Notes:  (Nursing/MHT/Case Management/Adjunct)  Date:  04/22/2016  Time:  9:36 AM  Type of Therapy:  Nurse Education  Participation Level:  Active  Participation Quality:  Appropriate  Affect:  Appropriate  Cognitive:  Appropriate  Insight:  Appropriate  Engagement in Group:  Engaged  Modes of Intervention:  Education and Support  Summary of Progress/Problems: Rameez shared that he had achieved his goal of checking on his pending eviction. (His belongings have not been set out on the street.)  Maurine Simmering 04/22/2016, 9:36 AM

## 2016-04-23 LAB — GLUCOSE, CAPILLARY: Glucose-Capillary: 102 mg/dL — ABNORMAL HIGH (ref 65–99)

## 2016-04-23 MED ORDER — METFORMIN HCL 1000 MG PO TABS: 1000.0000 mg | ORAL_TABLET | Freq: Two times a day (BID) | ORAL | 0 refills | Status: AC

## 2016-04-23 MED ORDER — CHLORTHALIDONE 25 MG PO TABS: 25.0000 mg | ORAL_TABLET | Freq: Every day | ORAL | Status: AC

## 2016-04-23 MED ORDER — COLCHICINE 0.6 MG PO TABS: 0.6000 mg | ORAL_TABLET | Freq: Every day | ORAL | 0 refills | Status: AC

## 2016-04-23 MED ORDER — AMLODIPINE BESYLATE 10 MG PO TABS: 10.0000 mg | ORAL_TABLET | Freq: Every day | ORAL | Status: AC

## 2016-04-23 MED ORDER — LISINOPRIL 40 MG PO TABS: 40.0000 mg | ORAL_TABLET | Freq: Every day | ORAL | 0 refills | Status: AC

## 2016-04-23 MED ORDER — MIRTAZAPINE 15 MG PO TABS: 15.0000 mg | ORAL_TABLET | Freq: Every day | ORAL | 0 refills | Status: AC

## 2016-04-23 MED ORDER — HYDROXYZINE HCL 50 MG PO TABS: 50.0000 mg | ORAL_TABLET | Freq: Every evening | ORAL | 0 refills | Status: AC | PRN

## 2016-04-23 MED ORDER — ADULT MULTIVITAMIN W/MINERALS CH: 1.0000 | ORAL_TABLET | Freq: Every day | ORAL | Status: AC

## 2016-04-23 MED ORDER — METOPROLOL TARTRATE 25 MG PO TABS: 25.0000 mg | ORAL_TABLET | Freq: Two times a day (BID) | ORAL | 0 refills | Status: AC

## 2016-04-23 MED ORDER — DULOXETINE HCL 60 MG PO CPEP: 60.0000 mg | ORAL_CAPSULE | Freq: Every day | ORAL | 0 refills | Status: AC

## 2016-04-23 MED ORDER — GABAPENTIN 300 MG PO CAPS: ORAL_CAPSULE | ORAL | 0 refills | Status: AC

## 2016-04-23 NOTE — [facility] Suicide Risk Assessment (Signed)
[facility] Discharge Suicide Risk Assessment   Principal Problem: Major depressive disorder, recurrent severe without psychotic features [facility]) Discharge Diagnoses:  Patient Active Problem List   Diagnosis Date Noted  . PTSD (post-traumatic stress disorder) [F43.10] 04/22/2016  . Hypoglycemia [E16.2]   . CKD stage 3 due to type 2 diabetes mellitus (HCC) [U98.11, N18.3] 04/17/2016  . Arm pain, musculoskeletal [M79.603] 04/17/2016  . Major depressive disorder, recurrent severe without psychotic features (HCC) [F33.2] 04/16/2016  . Cocaine dependence (HCC) [F14.20] 04/16/2016  . Alcohol dependence with uncomplicated withdrawal (HCC) [F10.230] 04/16/2016  . 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 Right upper quadrant pain [R10.11]   . Hypokalemia [E87.6] 02/17/2015  . Abdominal pain [R10.9] 02/17/2015  . Elevated LFTs [R79.89] 02/17/2015  . Hypertension [I10]   . 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 Depression [F32.9]   . Essential hypertension [I10]   . Cholelithiasis without cholecystitis [K80.20]   . Osteoarthritis of left knee [M17.9] 12/11/2014  . Alcohol dependence (HCC) [F10.20] 06/13/2013  . 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 ETOH abuse [F10.10] 03/16/2013  . Depression, major (HCC) [F32.9] 03/16/2013  . Acute encephalopathy [G93.40] 03/12/2013  . Acute respiratory failure (HCC) [J96.00] 03/12/2013  . Metabolic acidosis [E87.2] 03/12/2013  . Lactic acid acidosis [E87.2] 03/12/2013  . Acute renal failure (ARF) (HCC) [N17.9] 03/12/2013  . Sleep apnea (presumed) [G47.30] 03/12/2013  . Hyperglycemia [R73.9] 03/12/2013  . Chronic kidney disease, stage II (mild) [N18.2] 02/27/2013    Total Time spent with patient: 30 minutes  Musculoskeletal: Strength & Muscle Tone: within normal limits Gait & Station: normal Patient leans: N/A  Psychiatric Specialty Exam: Review of Systems  Psychiatric/Behavioral: Negative for depression and suicidal ideas. The  patient is not nervous/anxious.   All other systems reviewed and are negative.   Blood pressure 122/82, pulse 93, temperature 98.6 F (37 C), temperature source Oral, resp. rate 16, height  (1.778 m), weight 93 kg (205 lb), SpO2 100 %.Body mass index is 29.41 kg/m.  General Appearance: Fairly Groomed  Patent attorney::  Fair  Speech:  Normal [id number]  Volume:  Normal  Mood:  Euthymic  Affect:  Congruent  Thought Process:  Goal Directed and Descriptions of Associations: Intact  Orientation:  Full (Time, Place, and Person)  Thought Content:  WDL  Suicidal Thoughts:  No  Homicidal Thoughts:  No  Memory:  Immediate;   Fair Recent;   Fair Remote;   Fair  Judgement:  Fair  Insight:  Fair  Psychomotor Activity:  Normal  Concentration:  Fair  Recall:  [employer] of Knowledge:Fair  Language: Fair  Akathisia:  No  Handed:  Right  AIMS (if indicated):     Assets:  Communication Skills Desire for Improvement  Sleep:  Number of Hours: 4.75  Cognition: WNL  ADL's:  Intact   Mental Status Per Nursing Assessment::   On Admission:     Demographic Factors:  Male  Loss Factors: NA  Historical Factors: Impulsivity  Risk Reduction Factors:   Positive social support  Continued Clinical Symptoms:  Depression: improving Alcohol/Substance Abuse/Dependencies  Cognitive Features That Contribute To Risk:  None    Suicide Risk:  Minimal: No identifiable suicidal ideation.  Patients presenting with no risk factors but with morbid ruminations; may be classified as minimal risk based on the severity of the depressive symptoms  Follow-up Information    [facility] .   Why:  Victorino Dike will call back with PCP appt on Thursday 7/27.  Social worker will call you directly with this appt if not provided by your discharge date.  Contact information: (ATTN: [facility]. [facility]) [address] [place], [place] [address] Phone: [phone] Ext 1500 Victorino Dike  [facility] worker) Fax: [phone]       Patient declined additional referrals. .           Plan Of Care/Follow-up recommendations:  Activity:  no restrictions Diet:  carb modified Tests:  none Other:  none  Saba Neuman, MD 04/23/2016, 9:13 AM

## 2016-04-23 NOTE — Discharge Summary (Signed)
Physician Discharge Summary Note  Patient:  Angel Phillips is an 55 y.o., male  MRN:  [id number]  DOB:  1961-08-17  Patient phone:  [phone] (home)   Patient address:   [address] [place] [address],   Date of Admission:  04/16/2016  Date of Discharge: 04/23/16  Reason for Admission: Alcohol detox  Discharge Diagnoses: Principal Problem:   Major depressive disorder, recurrent severe without psychotic features (HCC) Active Problems:   Hypokalemia   CKD stage 3 due to type 2 diabetes mellitus (HCC)   Arm pain, musculoskeletal   Hypoglycemia   PTSD (post-traumatic stress disorder)  Review of Systems  Constitutional: Negative.   HENT: Negative.   Eyes: Negative.   Respiratory: Negative.   Cardiovascular: Negative.   Gastrointestinal: Negative.   Genitourinary: Negative.   Musculoskeletal: Negative.   Skin: Negative.   Neurological: Negative.   Endo/Heme/Allergies: Negative.   Psychiatric/Behavioral: Positive for depression (Stabilized with medication prior to discharge) and substance abuse (Alcoholism). Negative for hallucinations, memory loss and suicidal ideas. The patient is nervous/anxious (Stabilized with medication prior to discharge) and has insomnia (Stabilized with medication.).    Trauma-Stressor Disorders:  Complicated bereavement   Substance/Addictive Disorders:  Alcohol Withdrawal (291.81), Alcohol dependence  Depressive Disorders:  Major Depressive Disorder - Moderate (296.22)  Diagnosis:  Past Medical History:  Diagnosis Date  . Alcohol abuse   . Arthritis   . Chronic kidney disease 02/2013   ACUTE RENAL FAILURE  . Depression   . Diabetes mellitus without complication (HCC)   . HLD (hyperlipidemia)   . Hypertension   . OSA (obstructive sleep apnea)   . PONV (postoperative nausea and vomiting)   . Tuberculosis    tb positive 1989 ?    Level of Care:  OP  Hospital Course: Angel Phillips is a 55 year old divorced African American male  who presented to the emergency department with complaints of depression and excessive alcohol consumption. He reports that he drank 13 bottles of liquor, 13 40-ounce bottles of beer, and 2 bottles of wine over a 6 day period. Prior to this then she, he had 55 days sober going to [employer] He reports that the death of his mother 9 months ago, and a falling out with his girlfriend have sent him into a spiral of depression. He reports that he drinks to treat his depression. He denies any suicidal or homicidal ideation, or auditory or visual hallucinations, but he does endorse significant ruminating thoughts and severe anxiety. He reports that he has been quick tempered and demanding.  Angel Phillips was admitted to the [facility] adult unit with his BAL at 169 per toxicology test results & UDS positive for Cocaine. He is known to have history of cocaine and alcohol dependency. He did admit on the day of admission of worsening symptoms of depression & increased alcohol consumption. He cited the recent death of his mother & relationship issues as the trigger. He was in need of alcohol detox as well as mood stabilization treatments.    Angel Phillips  received alcohol detoxification treatment using ativan detox protocols. He was medicated & discharged on;  Duloxetine 30 mg daily for depression, Neurontin 600 mg qid daily for agitation/pain management, Hydroxyzine 50 mg Q hs for insomnia & Remeron 15 mg Q hs for depression/insomnia. He was resumed on all his pertinent home medications for his other pre-existing medical issues presented. He tolerated his treatment regimen without any adverse effects or reactions.  Angel Phillips's symptoms did respond positively to his treatment  regimen. This is evidenced by his daily reports of gradual mood improvement, reduction of symptoms and presentation of good affect/eye contact. Angel Phillips endorsed that his symptoms has stabilized and that he is ready for discharge to pursue psychiatric care as  noted below. It was then agreed upon that he will continue psychiatric treatment at the [facility]. He is provided with all the pertinent information needed to make this appointment without problems.  Upon discharge, Angel Phillips adamantly denies any suicidal, homicidal ideations, auditory, visual hallucinations, paranoia, withdrawal symptoms and or delusional thoughts. He left [facility] with all personal belongings via a taxi cab transport in no apparent distress.  Consults:  psychiatry  Discharge Vitals:   Blood pressure 122/82, pulse 93, temperature 98.6 F (37 C), temperature source Oral, resp. rate 16, height 5\' 10"  (1.778 m), weight 93 kg (205 lb), SpO2 100 %. Body mass index is 29.41 kg/m. Lab Results:   Results for orders placed or performed during the hospital encounter of 04/16/16 (from the past 72 hour(s))  Glucose, capillary     Status: Abnormal   Collection Time: 04/20/16 12:04 PM  Result Value Ref Range   Glucose-Capillary 101 (H) 65 - 99 mg/dL   Comment 1 Notify RN   Glucose, capillary     Status: Abnormal   Collection Time: 04/20/16  4:56 PM  Result Value Ref Range   Glucose-Capillary 222 (H) 65 - 99 mg/dL  Glucose, capillary     Status: None   Collection Time: 04/20/16  8:59 PM  Result Value Ref Range   Glucose-Capillary 94 65 - 99 mg/dL   Comment 1 Notify RN    Comment 2 Document in Chart   Glucose, capillary     Status: None   Collection Time: 04/21/16  5:39 AM  Result Value Ref Range   Glucose-Capillary 86 65 - 99 mg/dL   Comment 1 Notify RN   Glucose, capillary     Status: Abnormal   Collection Time: 04/21/16 11:54 AM  Result Value Ref Range   Glucose-Capillary 157 (H) 65 - 99 mg/dL   Comment 1 Notify RN   Glucose, capillary     Status: None   Collection Time: 04/21/16  3:15 PM  Result Value Ref Range   Glucose-Capillary 89 65 - 99 mg/dL   Comment 1 Notify RN   Glucose, capillary     Status: Abnormal   Collection Time: 04/21/16  5:05 PM  Result Value Ref  Range   Glucose-Capillary 169 (H) 65 - 99 mg/dL   Comment 1 Notify RN   Glucose, capillary     Status: Abnormal   Collection Time: 04/21/16  9:09 PM  Result Value Ref Range   Glucose-Capillary 138 (H) 65 - 99 mg/dL  Glucose, capillary     Status: Abnormal   Collection Time: 04/22/16  5:51 AM  Result Value Ref Range   Glucose-Capillary 107 (H) 65 - 99 mg/dL  Glucose, capillary     Status: None   Collection Time: 04/22/16 12:08 PM  Result Value Ref Range   Glucose-Capillary 90 65 - 99 mg/dL  Glucose, capillary     Status: Abnormal   Collection Time: 04/22/16  5:02 PM  Result Value Ref Range   Glucose-Capillary 121 (H) 65 - 99 mg/dL  Glucose, capillary     Status: None   Collection Time: 04/22/16  9:16 PM  Result Value Ref Range   Glucose-Capillary 81 65 - 99 mg/dL  Glucose, capillary     Status: Abnormal  Collection Time: 04/23/16  5:47 AM  Result Value Ref Range   Glucose-Capillary 102 (H) 65 - 99 mg/dL   Physical Findings: AIMS: Facial and Oral Movements Muscles of Facial Expression: None, normal Lips and Perioral Area: None, normal Jaw: None, normal Tongue: None, normal,Extremity Movements Upper (arms, wrists, hands, fingers): None, normal Lower (legs, knees, ankles, toes): None, normal, Trunk Movements Neck, shoulders, hips: None, normal, Overall Severity Severity of abnormal movements (highest score from questions above): None, normal Incapacitation due to abnormal movements: None, normal Patient's awareness of abnormal movements (rate only patient's report): No Awareness, Dental Status Current problems with teeth and/or dentures?: No Does patient usually wear dentures?: No  CIWA:  CIWA-Ar Total: 0 COWS:  COWS Total Score: 1  Psychiatric Specialty Exam: See Psychiatric Specialty Exam and Suicide Risk Assessment completed by Attending Physician prior to discharge.  Discharge destination:  Home  Is patient on multiple antipsychotic therapies at discharge:  No    Has Patient had three or more failed trials of antipsychotic monotherapy by history:  No  Recommended Plan for Multiple Antipsychotic Therapies: NA    Medication List    STOP taking these medications   buPROPion 300 MG 24 hr tablet Commonly known as:  WELLBUTRIN XL   glipiZIDE 10 MG tablet Commonly known as:  GLUCOTROL   lidocaine 5 % Commonly known as:  LIDODERM   VITAMIN B-12 IJ     TAKE these medications     Indication  amLODipine 10 MG tablet Commonly known as:  NORVASC Take 1 tablet (10 mg total) by mouth daily. For high blood pressure control  Indication:  High Blood Pressure   chlorthalidone 25 MG tablet Commonly known as:  HYGROTON Take 1 tablet (25 mg total) by mouth daily. For control of HTN/diabetes mellitus What changed:  when to take this  additional instructions  Indication:  Diabetes Insipidus, High Blood Pressure   colchicine 0.6 MG tablet Take 1 tablet (0.6 mg total) by mouth daily. For gouty arthritis  Indication:  Gout   DULoxetine 60 MG capsule Commonly known as:  CYMBALTA Take 1 capsule (60 mg total) by mouth daily. For depression  Indication:  Major Depressive Disorder, Musculoskeletal Pain   gabapentin 300 MG capsule Commonly known as:  NEURONTIN Take 2 tablets (600 mg) three times daily & 1 tablet (300 mg) at bedtime: For agitation/pain management What changed:  how much to take  how to take this  when to take this  additional instructions  Indication:  Agitation, Pain, Anxiety   hydrOXYzine 50 MG tablet Commonly known as:  ATARAX/VISTARIL Take 1 tablet (50 mg total) by mouth at bedtime as needed (sleep).  Indication:  Insomnia   lisinopril 40 MG tablet Commonly known as:  PRINIVIL,ZESTRIL Take 1 tablet (40 mg total) by mouth daily. For high blood pressure What changed:  additional instructions  Another medication with the same name was removed. Continue taking this medication, and follow the directions you see here.   Indication:  High Blood Pressure   metFORMIN 1000 MG tablet Commonly known as:  GLUCOPHAGE Take 1 tablet (1,000 mg total) by mouth 2 (two) times daily with a meal. For diabetes management What changed:  additional instructions  Indication:  Type 2 Diabetes   metoprolol tartrate 25 MG tablet Commonly known as:  LOPRESSOR Take 1 tablet (25 mg total) by mouth 2 (two) times daily. For high blood pressure  Indication:  High Blood Pressure   mirtazapine 15 MG tablet Commonly known as:  REMERON Take 1 tablet (15 mg total) by mouth at bedtime. For depression/insomnia What changed:  medication strength  how much to take  when to take this  additional instructions  Indication:  Trouble Sleeping, Major Depressive Disorder   multivitamin with minerals Tabs tablet Take 1 tablet by mouth daily. For low Vitamin  Indication:  Low Vitamin   simvastatin 40 MG tablet Commonly known as:  ZOCOR Take 20 mg by mouth at bedtime.  Indication:  Inherited Homozygous Hypercholesterolemia, Type II B Hyperlipidemia   tamsulosin 0.4 MG Caps capsule Commonly known as:  FLOMAX Take 1 capsule (0.4 mg total) by mouth daily.  Indication:  Enlarged Prostate      Follow-up Information    [facility] Follow up on 04/24/2016.   Why:  Appt on this date at 11:30AM with Dr. Loleta Books for hospital follow-up.  Contact information: (ATTN: DR. [facility]) [address] [place], [place] [address] Phone: [phone] Ext 1500 Victorino Dike [facility] worker) Fax: [phone]       Patient declined additional referrals. .          Follow-up recommendations: Activity:  As tolerated Diet: As recommended by your primary care doctor. Keep all scheduled follow-up appointments as recommended.  Comments:  Take all your medications as prescribed by your mental healthcare provider. Report any adverse effects and or reactions from your medicines to your outpatient provider  promptly. Patient is instructed and cautioned to not engage in alcohol and or illegal drug use while on prescription medicines. In the event of worsening symptoms, patient is instructed to call the crisis hotline, 911 and or go to the nearest ED for appropriate evaluation and treatment of symptoms. Follow-up with your primary care provider for your other medical issues, concerns and or health care needs  Signed: Sanjuana Kava, PMHNP-BC 04/23/2016, 10:34 AM

## 2016-04-23 NOTE — Progress Notes (Signed)
Recreation Therapy Notes  Date: 04/23/16 Time: 0930 Location: 300 Hall Group Room  Group Topic: Stress Management  Goal Area(s) Addresses:  Patient will verbalize importance of using healthy stress management.  Patient will identify positive emotions associated with healthy stress management.   Behavioral Response: Engaged  Intervention: Stress Management  Activity :  Progressive Muscle Relaxation.  LRT introduced the technique of progressive muscle relaxation to the patients.  LRT read a script to the patients for them to engage in the activity.  Pt were encouraged to follow along as the LRT read the script.  Education:  Stress Management, Discharge Planning.   Education Outcome: Acknowledges edcuation/In group clarification offered/Needs additional education  Clinical Observations/Feedback: Pt attended group.   Christinea Brizuela, LRT/CTRS  
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
## 2016-04-23 NOTE — Progress Notes (Signed)
Patient ID: Angel Phillips, male   DOB: July 30, 1961, 55 y.o.   MRN: [id number] Ahmad was euthymic and pleasant on the unit this a.m. He took medications and participated in treatment until he was discharged per MD order. Medications, scripts, AVS, transition summary, and SRA were all reviewed. Pt verbalized understanding of all and had opportunity to ask questions. Belongings were returned and signed for. Pt was discharged to lobby with a [employer] taxi voucher. He appeared in no acute distress and verbalized readiness for discharge.

## 2016-04-23 NOTE — Progress Notes (Signed)
  [facility] Adult Case Management Discharge Plan :  Will you be returning to the same living situation after discharge:  Yes,  home  At discharge, do you have transportation home?: Yes,  taxi voucher in chart. due to medical issues, pt is unable to ride bus Do you have the ability to pay for your medications: Yes,  Medicare/[facility] connected.  Release of information consent forms completed and submitted to medical records by CSW. Patient to Follow up at: Follow-up Information    [facility] .   Why:  Victorino Dike will call back with PCP appt on Thursday 7/27. Social worker will call you directly with this appt if not provided by your discharge date.  Contact information: (ATTN: DR. [facility]) [address] [place], [place] [address] Phone: [phone] Ext 1500 Victorino Dike [facility] worker) Fax: [phone]        Patient declined additional referrals. [facility] Social worker Victorino Dike is aware of pt needs and will continue working with him on outpatient basis after discharge.   Next level of care provider has access to [facility] Link:no  Safety Planning and Suicide Prevention discussed: Yes,  SPE completed with pt; pt declined to consent to family contact. SPI pamphlet and Mobile Crisis information provided to pt. He was encouraged to share information with support network, ask questions, and talk about any concerns relating to SPE.  Have you used any form of tobacco in the last 30 days? (Cigarettes, Smokeless Tobacco, Cigars, and/or Pipes): No  Has patient been referred to the Quitline?: N/A patient is not a smoker  Patient has been referred for addiction treatment: Yes  Smart, Shamar Kracke LCSW 04/23/2016, 8:58 AM

## 2016-04-23 NOTE — Progress Notes (Signed)
Pt appt with [facility] clinic for tomorrow, 7/27 at 11:30AM. Pt declined additional referrals. CSW provided pt with AA pamphlets for [place] counties and [employer] pamphlet.   Trula Slade, MSW, LCSW Clinical Social Worker 04/23/2016 9:50 AM

## 2016-05-06 ENCOUNTER — Inpatient Hospital Stay (HOSPITAL_COMMUNITY): Admission: AD | Admit: 2016-05-06 | Payer: Medicare Other | Source: Intra-hospital | Admitting: Psychiatry

## 2016-05-06 ENCOUNTER — Emergency Department (HOSPITAL_COMMUNITY)
Admission: EM | Admit: 2016-05-06 | Discharge: 2016-05-08 | Payer: Medicare Other | Attending: Emergency Medicine | Admitting: Emergency Medicine

## 2016-05-06 ENCOUNTER — Encounter (HOSPITAL_COMMUNITY): Payer: Self-pay | Admitting: Nurse Practitioner

## 2016-05-06 DIAGNOSIS — Z79899 Other long term (current) drug therapy: Secondary | ICD-10-CM | POA: Insufficient documentation

## 2016-05-06 DIAGNOSIS — R451 Restlessness and agitation: Secondary | ICD-10-CM | POA: Insufficient documentation

## 2016-05-06 DIAGNOSIS — E1122 Type 2 diabetes mellitus with diabetic chronic kidney disease: Secondary | ICD-10-CM | POA: Insufficient documentation

## 2016-05-06 DIAGNOSIS — Z7984 Long term (current) use of oral hypoglycemic drugs: Secondary | ICD-10-CM | POA: Insufficient documentation

## 2016-05-06 DIAGNOSIS — F332 Major depressive disorder, recurrent severe without psychotic features: Secondary | ICD-10-CM | POA: Diagnosis present

## 2016-05-06 DIAGNOSIS — R45851 Suicidal ideations: Secondary | ICD-10-CM

## 2016-05-06 DIAGNOSIS — I129 Hypertensive chronic kidney disease with stage 1 through stage 4 chronic kidney disease, or unspecified chronic kidney disease: Secondary | ICD-10-CM | POA: Insufficient documentation

## 2016-05-06 DIAGNOSIS — F1020 Alcohol dependence, uncomplicated: Secondary | ICD-10-CM | POA: Diagnosis present

## 2016-05-06 DIAGNOSIS — N189 Chronic kidney disease, unspecified: Secondary | ICD-10-CM | POA: Insufficient documentation

## 2016-05-06 LAB — CBC WITH DIFFERENTIAL/PLATELET
Basophils Absolute: 0 10*3/uL (ref 0.0–0.1)
Basophils Relative: 1 %
EOS ABS: 0.2 10*3/uL (ref 0.0–0.7)
EOS PCT: 5 %
HCT: 39 % (ref 39.0–52.0)
Hemoglobin: 12.8 g/dL — ABNORMAL LOW (ref 13.0–17.0)
LYMPHS ABS: 1.5 10*3/uL (ref 0.7–4.0)
Lymphocytes Relative: 41 %
MCH: 27.9 pg (ref 26.0–34.0)
MCHC: 32.8 g/dL (ref 30.0–36.0)
MCV: 85 fL (ref 78.0–100.0)
MONO ABS: 0.1 10*3/uL (ref 0.1–1.0)
Monocytes Relative: 4 %
Neutro Abs: 1.8 10*3/uL (ref 1.7–7.7)
Neutrophils Relative %: 49 %
PLATELETS: 533 10*3/uL — AB (ref 150–400)
RBC: 4.59 MIL/uL (ref 4.22–5.81)
RDW: 14.1 % (ref 11.5–15.5)
WBC: 3.6 10*3/uL — AB (ref 4.0–10.5)

## 2016-05-06 LAB — COMPREHENSIVE METABOLIC PANEL
ALBUMIN: 3.6 g/dL (ref 3.5–5.0)
ALK PHOS: 69 U/L (ref 38–126)
ALT: 15 U/L — ABNORMAL LOW (ref 17–63)
ANION GAP: 12 (ref 5–15)
AST: 28 U/L (ref 15–41)
BILIRUBIN TOTAL: 0.4 mg/dL (ref 0.3–1.2)
BUN: 11 mg/dL (ref 6–20)
CALCIUM: 8.6 mg/dL — AB (ref 8.9–10.3)
CO2: 26 mmol/L (ref 22–32)
CREATININE: 1.2 mg/dL (ref 0.61–1.24)
Chloride: 102 mmol/L (ref 101–111)
GFR calc non Af Amer: >60 mL/min (ref >60)
GLUCOSE: 147 mg/dL — AB (ref 65–99)
Potassium: 3 mmol/L — ABNORMAL LOW (ref 3.5–5.1)
Sodium: 140 mmol/L (ref 135–145)
TOTAL PROTEIN: 6.7 g/dL (ref 6.5–8.1)

## 2016-05-06 LAB — RAPID URINE DRUG SCREEN, HOSP PERFORMED
Amphetamines: NOT DETECTED
BENZODIAZEPINES: NOT DETECTED
Barbiturates: NOT DETECTED
COCAINE: POSITIVE — AB
Opiates: NOT DETECTED
Tetrahydrocannabinol: NOT DETECTED

## 2016-05-06 LAB — URINALYSIS, ROUTINE W REFLEX MICROSCOPIC
Bilirubin Urine: NEGATIVE
GLUCOSE, UA: NEGATIVE mg/dL
Hgb urine dipstick: NEGATIVE
KETONES UR: NEGATIVE mg/dL
NITRITE: NEGATIVE
PROTEIN: 30 mg/dL — AB
Specific Gravity, Urine: 1.008 (ref 1.005–1.030)
pH: 6 (ref 5.0–8.0)

## 2016-05-06 LAB — URINE MICROSCOPIC-ADD ON: RBC / HPF: NONE SEEN RBC/hpf (ref 0–5)

## 2016-05-06 LAB — ETHANOL: Alcohol, Ethyl (B): 291 mg/dL — ABNORMAL HIGH (ref <5)

## 2016-05-06 LAB — LIPASE, BLOOD: Lipase: 28 U/L (ref 11–51)

## 2016-05-06 MED ORDER — LORAZEPAM 1 MG PO TABS
0.0000 mg | ORAL_TABLET | Freq: Four times a day (QID) | ORAL | Status: DC
Start: 2016-05-06 — End: 2016-05-08
  Administered 2016-05-06: 2 mg via ORAL
  Administered 2016-05-06 – 2016-05-07 (×2): 1 mg via ORAL
  Administered 2016-05-07 – 2016-05-08 (×3): 2 mg via ORAL
  Filled 2016-05-06 (×4): qty 2
  Filled 2016-05-06 (×2): qty 1

## 2016-05-06 MED ORDER — LORAZEPAM 2 MG/ML IJ SOLN: 1.0000 mg | Freq: Four times a day (QID) | INTRAMUSCULAR | Status: DC | PRN

## 2016-05-06 MED ORDER — THIAMINE HCL 100 MG/ML IJ SOLN: 100.0000 mg | Freq: Every day | INTRAMUSCULAR | Status: DC

## 2016-05-06 MED ORDER — ADULT MULTIVITAMIN W/MINERALS CH
1.0000 | ORAL_TABLET | Freq: Every day | ORAL | Status: DC
  Administered 2016-05-06 – 2016-05-08 (×3): 1 via ORAL
  Filled 2016-05-06 (×3): qty 1

## 2016-05-06 MED ORDER — VITAMIN B-1 100 MG PO TABS
100.0000 mg | ORAL_TABLET | Freq: Every day | ORAL | Status: DC
  Administered 2016-05-06 – 2016-05-08 (×3): 100 mg via ORAL
  Filled 2016-05-06 (×3): qty 1

## 2016-05-06 MED ORDER — BENZTROPINE MESYLATE 1 MG PO TABS: 1.0000 mg | ORAL_TABLET | Freq: Four times a day (QID) | ORAL | Status: DC | PRN

## 2016-05-06 MED ORDER — HALOPERIDOL LACTATE 5 MG/ML IJ SOLN: 5.0000 mg | Freq: Four times a day (QID) | INTRAMUSCULAR | Status: DC | PRN

## 2016-05-06 MED ORDER — LORAZEPAM 1 MG PO TABS: 0.0000 mg | ORAL_TABLET | Freq: Two times a day (BID) | ORAL | Status: DC

## 2016-05-06 MED ORDER — HALOPERIDOL LACTATE 5 MG/ML IJ SOLN
5.0000 mg | Freq: Once | INTRAMUSCULAR | Status: AC
  Administered 2016-05-06: 5 mg via INTRAMUSCULAR
  Filled 2016-05-06: qty 1

## 2016-05-06 MED ORDER — FOLIC ACID 1 MG PO TABS
1.0000 mg | ORAL_TABLET | Freq: Every day | ORAL | Status: DC
  Administered 2016-05-06 – 2016-05-08 (×3): 1 mg via ORAL
  Filled 2016-05-06 (×3): qty 1

## 2016-05-06 MED ORDER — LORAZEPAM 1 MG PO TABS
1.0000 mg | ORAL_TABLET | Freq: Four times a day (QID) | ORAL | Status: DC | PRN
  Administered 2016-05-06: 1 mg via ORAL
  Filled 2016-05-06: qty 1

## 2016-05-06 MED ORDER — POTASSIUM CHLORIDE CRYS ER 20 MEQ PO TBCR
40.0000 meq | EXTENDED_RELEASE_TABLET | Freq: Once | ORAL | Status: AC
  Administered 2016-05-06: 40 meq via ORAL
  Filled 2016-05-06: qty 2

## 2016-05-06 MED ORDER — HALOPERIDOL 5 MG PO TABS: 5.0000 mg | ORAL_TABLET | Freq: Four times a day (QID) | ORAL | Status: DC | PRN

## 2016-05-06 NOTE — ED Notes (Signed)
Angel Phillips, [facility] informed of patient elevated BP and agreed [facility]als are more stable. Will continue to monitor patient.

## 2016-05-06 NOTE — ED Notes (Signed)
Patient is resting comfortably. Safety maintain at this time. Q 15 min safety checks remain in place and video monitoring.
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
## 2016-05-06 NOTE — ED Notes (Signed)
Patient medicated per CIWA protocol and giving po fluids. Patient educated not to sit up or stand abruptly. Patient informed to call for assistance if he feels faint or lightheaded. Several cups of water to patient. Safety maintain and Q 15 min safety checks and video monitoring in place.

## 2016-05-06 NOTE — Progress Notes (Signed)
ED CM note pt without insurance coverage and no pcp listed in EPIC at this time  Angel FlesherWent to speak with tp Pt is being assessed by TTS staff member  Pt 's male sitter in hallway CM spoke with sitter who states pt was doing okay and not aggressive but had ask to go home
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
## 2016-05-06 NOTE — ED Provider Notes (Signed)
[facility]-EMERGENCY DEPT Provider Note   CSN: [id number] Arrival date & time: 05/06/16  8657        History   Chief Complaint Chief Complaint  Patient presents with  . Alcohol Intoxication  . Suicidal    HPI Angel Phillips is a 55 y.o. male.  HPI  No past medical history on file.  There are no active problems to display for this patient.   No past surgical history on file.   patient presents via police escort due to uncooperative behavior, repetitive speech. Patient himself keeps repeating let me die, offers no other verbal responses to questioning. Per report the patient was recently served notice of infection from his apartment, has history of military service, has history of psychiatric disease. On arrival EMS notes that the patient was repeating similar statements, not complaining of anything, but was uncooperative, minimally directable. Patient was not aggressive, but required some restraint for transport. No report of vital sign abnormalities, nor trauma. Level V caveat secondary to mental status.   Home Medications    Prior to Admission medications   Not on File    Family History No family history on file.  Social History Social History  Substance Use Topics  . Smoking status: Not on file  . Smokeless tobacco: Not on file  . Alcohol use Yes     Allergies   Review of patient's allergies indicates not on file.   Review of Systems Review of Systems  Unable to perform ROS: Mental status change     Physical Exam Updated Vital Signs BP 152/90 (BP Location: Right Arm)   Pulse 91   SpO2 100%   Physical Exam  Constitutional: He appears well-developed. He appears distressed.  Elderly male repetitive speech stating that he wishes to die, otherwise not following commands  HENT:  Head: Normocephalic and atraumatic.  Eyes: Conjunctivae and EOM are normal.  Cardiovascular: Normal rate and regular rhythm.   Pulmonary/Chest: Effort normal. No stridor.  No respiratory distress.  Abdominal: He exhibits no distension.  Musculoskeletal: He exhibits no edema.  Neurological: He is alert. He displays no atrophy and no tremor. He exhibits normal muscle tone.  Does not follow commands, but moves all extremity spontaneously, face is symmetric, speech is clear, but repetitive  Skin: Skin is warm and dry.  Psychiatric: His mood appears anxious. He is agitated. Cognition and memory are impaired. He expresses suicidal ideation.  Nursing note and vitals reviewed.  Immediately after the initial exam given the patient's persistent anxiety, agitated state, lack of cooperation, patient received Haldol, soft restraints.  ED Treatments / Results  Labs (all labs ordered are listed, but only abnormal results are displayed) Labs Reviewed  COMPREHENSIVE METABOLIC PANEL - Abnormal; Notable for the following:       Result Value   Potassium 3.0 (*)    Glucose, Bld 147 (*)    Calcium 8.6 (*)    ALT 15 (*)    All other components within normal limits  ETHANOL - Abnormal; Notable for the following:    Alcohol, Ethyl (B) 291 (*)    All other components within normal limits  CBC WITH DIFFERENTIAL/PLATELET - Abnormal; Notable for the following:    WBC 3.6 (*)    Hemoglobin 12.8 (*)    Platelets 533 (*)    All other components within normal limits  URINALYSIS, ROUTINE W REFLEX MICROSCOPIC (NOT AT [facility]) - Abnormal; Notable for the following:    Protein, ur 30 (*)    Leukocytes, UA  LARGE (*)    All other components within normal limits  URINE RAPID DRUG SCREEN, HOSP PERFORMED - Abnormal; Notable for the following:    Cocaine POSITIVE (*)    All other components within normal limits  URINE MICROSCOPIC-ADD ON - Abnormal; Notable for the following:    Squamous Epithelial / LPF 0-5 (*)    Bacteria, UA MANY (*)    All other components within normal limits  LIPASE, BLOOD    Initial labs most notable for elevated alcohol level  EKG  EKG  Interpretation  Date/Time:  Tuesday May 06 2016 09:50:54 EDT Ventricular Rate:  83 PR Interval:    QRS Duration: 84 QT Interval:  399 QTC Calculation: 469 R Axis:   15 Text Interpretation:  Sinus rhythm Artifact T wave abnormality Abnormal ekg Confirmed by Gerhard MunchLOCKWOOD, Sheika Coutts  MD ([id number]) on 05/06/2016 10:06:36 AM        Procedures Procedures (including critical care time)  Medications Ordered in ED Medications  haloperidol (HALDOL) tablet 5 mg (not administered)    And  benztropine (COGENTIN) tablet 1 mg (not administered)  haloperidol lactate (HALDOL) injection 5 mg (5 mg Intramuscular Given 05/06/16 0941)     Initial Impression / Assessment and Plan / ED Course  I have reviewed the triage vital signs and the nursing notes.  Pertinent labs & imaging results that were available during my care of the patient were reviewed by me and considered in my medical decision making (see chart for details).  Clinical Course  Comment By Time  Patient sleeping Gerhard Munchobert Odalis Jordan, MD 08/08 1101  Patient sleeping Gerhard Munchobert Darcee Dekker, MD 08/08 1209    On repeat exam the patient awakens more easily, though he speaks nonsensically. No additional agitation, wrist restraints removed.  Final Clinical Impressions(s) / ED Diagnoses  Elderly male presents via police escort agitated, nonsensical, screaming he is ready to die. Patient is initially uncontrollable, and given his agitated state, there is some concern for danger to self, others. Patient required both chemical and physical restraints. After some time with medication on board, the patient was much calmer, though he remained somnolent. Patient's labs most notable for demonstration of alcohol intoxication. Patient eventually appropriate for psychiatric evaluation.   CRITICAL CARE Performed by: Gerhard MunchLOCKWOOD, Jazz Biddy Total critical care time: 40 minutes Critical care time was exclusive of separately billable procedures and treating other  patients. Critical care was necessary to treat or prevent imminent or life-threatening deterioration. Critical care was time spent personally by me on the following activities: development of treatment plan with patient and/or surrogate as well as nursing, discussions with consultants, evaluation of patient's response to treatment, examination of patient, obtaining history from patient or surrogate, ordering and performing treatments and interventions, ordering and review of laboratory studies, ordering and review of radiographic studies, pulse oximetry and re-evaluation of patient's condition.    Gerhard Munchobert Mindie Rawdon, MD 05/06/16 1450

## 2016-05-06 NOTE — ED Triage Notes (Signed)
Patient presents to [facility] via [facility] for complaints of EtOH, suicidal, and possible psychosis. Law enforcement was called to patient's home to assist with serving eviction notice. Door was kicked in and landlord and Patent examinerlaw enforcement found patient unresponsive. EMS was called. Patient remained unresponsive until he was pulled to stretcher. He then became belligerent and yelling that 'all my friends are dead', 'I'm going to die today'. Patient had to be restrained.

## 2016-05-06 NOTE — ED Notes (Signed)
Pt reports that he drinks two 40-ounce beers and around 16 ounces of hard liquor a day and has been doing so for years.

## 2016-05-06 NOTE — ED Notes (Signed)
Patient aware that a urine sample is needed 

## 2016-05-06 NOTE — Progress Notes (Signed)
Pt informed Cm [facility]

## 2016-05-06 NOTE — BH Assessment (Addendum)
Assessment Note  Dodd Schmid is an 55 y.o. male presenting to [facility] via EMS. Reportedly law enforcement was called to patient's home to serve him with eviction papers. Upon entering the home patients door was kicked and he was found unresponsive. EMS called and patient was yelling "all my friends are dead and I'm going to die today". EMS and [employer] were unclear if patient was having a psychotic episode. Patient brought to [facility] for medical clearance and a TTS assessment. Writer met with patient face to face. Patient explains that he is [place] and served in the [employer] [phone] [employer]). Patient sts that a year he had knee replacement surgery (left leg). Patient is able to complete all ADL's. However, patient sts he does need knee replacement of the right leg. Patient was granted temporary disability funds per month and has received the funds for the past year. Patient's funding from the [place] ended June 2017. Patient has fell behind in bills and not only is he facing eviction but his car is going to be repossessed. Patient has suicidal thoughts with a plan to overdosing on alcohol. Patient stating that he did try to kill himself by drinking an excessive amount of alcohol. He denies prior attempts to end his life. No self mutilating behaviors. No family history of mental illness. Patient describes his depressive symptoms as hopelessness, fatigue, and isolating self from others. Patient sts that he doesn't sleep at all. He has not appetite. No HI. He reports auditory hallucinations telling him, "Gwenlyn Found yourself because your going to be evicted". Patient reports substance abuse use and history. See Additional Social History for details related to substance use. Patient denies withdrawal symptoms. No history of seizures or DT's. He does have a history of blackouts. He has received inpatient detox at the [facility]. He has also received outpatient psychiatry at the [facility] in [place].   Diagnosis: Major Depressive  Disorder, Single Episode, Severe; Polysubstance Use    Past Medical History: No past medical history on file.  No past surgical history on file.  Family History: No family history on file.  Social History:  reports that he drinks alcohol. His tobacco and drug histories are not on file.  Additional Social History:  Alcohol / Drug Use Pain Medications: SEE MAR Prescriptions: SEE MAR Over the Counter: SEE MAR History of alcohol / drug use?: Yes Substance #1 Name of Substance 1: Alcohol  1 - Age of First Use: 55 yrs old  1 - Amount (size/oz): Binge drinker "liqour and beer" 1 - Frequency: "Yrs" 1 - Duration: On-going 1 - Last Use / Amount: 05/05/2016 Substance #2 Name of Substance 2: THC 2 - Age of First Use: 55 yrs old  2 - Amount (size/oz): "As much as I can get and whenever I have the money" 2 - Frequency:  "When I can get it and when I have the money" 2 - Duration: On-going  2 - Last Use / Amount: "Couple of months ago" Substance #3 Name of Substance 3: Cocaine  3 - Age of First Use: 55 yrs old  3 - Amount (size/oz): "As much as I can get and whenever I have the money" 3 - Frequency:  "When I can get it and when I have the money" 3 - Duration: On-going  3 - Last Use / Amount: "2 days ago"  CIWA: CIWA-Ar BP: 161/75 Pulse Rate: 81 Nausea and Vomiting: no nausea and no vomiting Tactile Disturbances: none Tremor: two Auditory Disturbances: not present Paroxysmal Sweats: no  sweat visible Visual Disturbances: not present Anxiety: moderately anxious, or guarded, so anxiety is inferred Headache, Fullness in Head: none present Agitation: somewhat more than normal activity Orientation and Clouding of Sensorium: oriented and can do serial additions CIWA-Ar Total: 7 COWS:    Allergies: No Known Allergies  Home Medications:  (Not in a hospital admission)  OB/GYN Status:  No LMP for male patient.  General Assessment Data Location of Assessment: [facility] TTS Assessment: In  system Is this a Tele or Face-to-Face Assessment?: Face-to-Face Is this an Initial Assessment or a Re-assessment for this encounter?: Initial Assessment Marital status: Single Maiden name:  (n/a) Is patient pregnant?: No Pregnancy Status: No Living Arrangements: Other (Comment) Can pt return to current living arrangement?: No Admission Status: Voluntary Is patient capable of signing voluntary admission?: No Referral Source: Self/Family/Friend Insurance type:  (VA Benefits)     Crisis Care Plan Living Arrangements: Other (Comment) Legal Guardian:  (no legal guardian ) Name of Psychiatrist:  ([place]) Name of Therapist:  (no therapist )  Education Status Is patient currently in school?: No Current Grade:  (n/a) Highest grade of school patient has completed:  (n/a) Name of school:  (n/a) Contact person:  (n/a)  Risk to self with the past 6 months Suicidal Ideation: Yes-Currently Present Suicidal Intent: Yes-Currently Present Has patient had any suicidal intent within the past 6 months prior to admission? : Yes Is patient at risk for suicide?: Yes Suicidal Plan?: Yes-Currently Present Has patient had any suicidal plan within the past 6 months prior to admission? : Yes Specify Current Suicidal Plan:  (drink self to death) Access to Means: Yes Specify Access to Suicidal Means:  (liqour and beer) What has been your use of drugs/alcohol within the last 12 months?:  (patient reports alcohol, cocaine, and thc use ) Previous Attempts/Gestures: Yes How many times?:  (0) Other Self Harm Risks:  (denies ) Triggers for Past Attempts:  (no previous attempts or gestures ) Intentional Self Injurious Behavior: None Family Suicide History: No Recent stressful life event(s): Other (Comment), Loss (Comment), Financial Problems (loss VA disability check, served eviction from apt, no $) Persecutory voices/beliefs?: No Depression: Yes Depression Symptoms: Feeling  angry/irritable, Loss of interest in usual pleasures, Feeling worthless/self pity, Guilt, Fatigue, Isolating, Tearfulness, Insomnia, Despondent Substance abuse history and/or treatment for substance abuse?: No Suicide prevention information given to non-admitted patients: Not applicable  Risk to Others within the past 6 months Homicidal Ideation: No Does patient have any lifetime risk of violence toward others beyond the six months prior to admission? : No Thoughts of Harm to Others: No Current Homicidal Intent: No Current Homicidal Plan: No Access to Homicidal Means: No Identified Victim:  (n/a) History of harm to others?: No Assessment of Violence: None Noted Does patient have access to weapons?: No Criminal Charges Pending?: No Does patient have a court date: No Is patient on probation?: No  Psychosis Hallucinations: None noted Delusions: None noted  Mental Status Report Appearance/Hygiene: Disheveled Eye Contact: Fair Motor Activity: Freedom of movement, Agitation Speech: Logical/coherent Level of Consciousness: Alert Mood: Depressed, Anxious, Irritable, Sad Affect: Depressed, Anxious, Angry Anxiety Level: Severe Thought Processes: Relevant, Coherent Judgement: Impaired Orientation: Person, Place, Time, Situation Obsessive Compulsive Thoughts/Behaviors: None  Cognitive Functioning Concentration: Decreased Memory: Recent Intact, Remote Intact IQ: Average Insight: Poor Impulse Control: Good Appetite: Fair Weight Loss:  (patient denies ) Weight Gain:  (patient denies ) Sleep: Decreased Total Hours of Sleep:  ("I don't know..but I don't sleep") Vegetative  Symptoms: Staying in bed, Decreased grooming, Not bathing  ADLScreening [facility] Assessment Services) Patient's cognitive ability adequate to safely complete daily activities?: Yes Patient able to express need for assistance with ADLs?: Yes Independently performs ADLs?: Yes (appropriate for developmental  age)  Prior Inpatient Therapy Prior Inpatient Therapy: No Prior Therapy Dates:  (n/a) Prior Therapy Facilty/Provider(s):  (n/a) Reason for Treatment:  (n/a)  Prior Outpatient Therapy Prior Outpatient Therapy: Yes Prior Therapy Dates:  (current) Prior Therapy Facilty/Provider(s):  ([facility] ) Reason for Treatment:  (substance abuse) Does patient have an ACCT team?: No Does patient have Intensive In-House Services?  : No Does patient have [facility] services? : No Does patient have P4CC services?: No  ADL Screening (condition at time of admission) Patient's cognitive ability adequate to safely complete daily activities?: Yes Is the patient deaf or have difficulty hearing?: No Does the patient have difficulty seeing, even when wearing glasses/contacts?: No Does the patient have difficulty concentrating, remembering, or making decisions?: No Patient able to express need for assistance with ADLs?: Yes Does the patient have difficulty dressing or bathing?: No Independently performs ADLs?: Yes (appropriate for developmental age) Does the patient have difficulty walking or climbing stairs?: No Weakness of Legs: None Weakness of Arms/Hands: None  Home Assistive Devices/Equipment Home Assistive Devices/Equipment: None    Abuse/Neglect Assessment (Assessment to be complete while patient is alone) Physical Abuse: Denies Verbal Abuse: Denies Sexual Abuse: Denies Exploitation of patient/patient's resources: Denies Self-Neglect: Denies Values / Beliefs Cultural Requests During Hospitalization: None Spiritual Requests During Hospitalization: None   Advance Directives (For Healthcare) Does patient have an advance directive?: No Would patient like information on creating an advanced directive?: No - patient declined information Nutrition Screen- [facility] Adult/[facility] Patient's home diet: Regular  Additional Information 1:1 In Past 12 Months?: No CIRT Risk: No Elopement Risk:  No Does patient have medical clearance?: Yes     Disposition:  Disposition Initial Assessment Completed for this Encounter: Yes Disposition of Patient: Inpatient treatment program (Accepted to [facility] adult unit by Waylan Boga, DNP [id number] ) Type of inpatient treatment program: Adult  On Site Evaluation by:   Reviewed with Physician: Waylan Boga, DNP                                               Disposition of Patient: Inpatient treatment program (Accepted to [facility] adult unit by Waylan Boga, DNP [phone] )    On Site Evaluation by:   Reviewed with Physician:    Waldon Merl [facility] 05/06/2016 3:06 PM

## 2016-05-07 DIAGNOSIS — F332 Major depressive disorder, recurrent severe without psychotic features: Secondary | ICD-10-CM

## 2016-05-07 DIAGNOSIS — F1020 Alcohol dependence, uncomplicated: Secondary | ICD-10-CM | POA: Diagnosis present

## 2016-05-07 MED ORDER — MULTIVITAMIN ADULT PO TABS: 1.0000 | ORAL_TABLET | Freq: Every day | ORAL | Status: DC

## 2016-05-07 MED ORDER — GABAPENTIN 300 MG PO CAPS
300.0000 mg | ORAL_CAPSULE | Freq: Three times a day (TID) | ORAL | Status: DC
Start: 2016-05-07 — End: 2016-05-08
  Administered 2016-05-07 – 2016-05-08 (×4): 300 mg via ORAL
  Filled 2016-05-07 (×4): qty 1

## 2016-05-07 MED ORDER — METFORMIN HCL 500 MG PO TABS
1000.0000 mg | ORAL_TABLET | Freq: Two times a day (BID) | ORAL | Status: DC
  Administered 2016-05-07 – 2016-05-08 (×2): 1000 mg via ORAL
  Filled 2016-05-07 (×2): qty 2

## 2016-05-07 MED ORDER — DULOXETINE HCL 60 MG PO CPEP
60.0000 mg | ORAL_CAPSULE | Freq: Every day | ORAL | Status: DC
  Administered 2016-05-07 – 2016-05-08 (×2): 60 mg via ORAL
  Filled 2016-05-07 (×2): qty 1

## 2016-05-07 MED ORDER — CHLORTHALIDONE 25 MG PO TABS
25.0000 mg | ORAL_TABLET | Freq: Every day | ORAL | Status: DC
  Administered 2016-05-07 – 2016-05-08 (×2): 25 mg via ORAL
  Filled 2016-05-07 (×2): qty 1

## 2016-05-07 MED ORDER — ATORVASTATIN CALCIUM 20 MG PO TABS
20.0000 mg | ORAL_TABLET | Freq: Every day | ORAL | Status: DC
  Filled 2016-05-07: qty 1

## 2016-05-07 MED ORDER — AMLODIPINE BESYLATE 10 MG PO TABS
10.0000 mg | ORAL_TABLET | Freq: Every day | ORAL | Status: DC
  Administered 2016-05-07 – 2016-05-08 (×2): 10 mg via ORAL
  Filled 2016-05-07 (×2): qty 1

## 2016-05-07 MED ORDER — SIMVASTATIN 40 MG PO TABS
40.0000 mg | ORAL_TABLET | Freq: Every day | ORAL | Status: DC
  Administered 2016-05-07: 40 mg via ORAL
  Filled 2016-05-07: qty 1

## 2016-05-07 MED ORDER — LISINOPRIL 40 MG PO TABS
40.0000 mg | ORAL_TABLET | Freq: Every day | ORAL | Status: DC
  Administered 2016-05-07 – 2016-05-08 (×2): 40 mg via ORAL
  Filled 2016-05-07 (×2): qty 1

## 2016-05-07 MED ORDER — TAMSULOSIN HCL 0.4 MG PO CAPS
0.4000 mg | ORAL_CAPSULE | Freq: Every day | ORAL | Status: DC
  Administered 2016-05-07: 0.4 mg via ORAL
  Filled 2016-05-07 (×2): qty 1

## 2016-05-07 MED ORDER — METOPROLOL TARTRATE 25 MG PO TABS
25.0000 mg | ORAL_TABLET | Freq: Two times a day (BID) | ORAL | Status: DC
  Administered 2016-05-07 – 2016-05-08 (×3): 25 mg via ORAL
  Filled 2016-05-07 (×3): qty 1

## 2016-05-07 NOTE — ED Notes (Signed)
Patient continues to be depressed and sad about his financial and housing situation. Patient reports SI with no plan and denies HI and AVH at this time. Plan of care discussed with patient. Patient voices no complaints or concerns at this time. Encouragement and support provided and safety maintain. Q 15 min safety checks remain in place and video monitoring.

## 2016-05-07 NOTE — Progress Notes (Signed)
05/07/16 1405:  LRT went to pt room, pt was asleep.   Caroll RancherMarjette Karalina Tift, LRT/CTRS

## 2016-05-07 NOTE — Progress Notes (Signed)
The order for simvastatin(Zocor) was changed to an equivalent dose of atorvastatin(Lipitor) due to the potential drug interaction with Amlodipine.  When taken in combination with medications that inhibit its metabolism, simvastatin can accumulate which increases the risk of liver toxicity, myopathy, or rhabdomyolysis.  Simvastatin dose should not exceed 10mg /day in patients taking verapamil, diltiazem, fibrates, or niacin >or= 1g/day.   Simvastatin dose should not exceed 20mg /day in patients taking amlodipine, ranolazine or amiodarone.   Please consider this potential interaction at discharge.  Bernadene Personrew Yarethzi Branan, PharmD, BCPS Pager: [phone] 05/07/2016, 4:17 PM

## 2016-05-07 NOTE — ED Notes (Signed)
Patient states he feels much better.  Up in hallway some today.  Denies S/I, H/I, and AVH.  Withdrawal symptoms much better.  Eating well and compliant with medication.  15 minute checks and video monitoring continue.

## 2016-05-07 NOTE — ED Notes (Signed)
[facility]. Tori, AC hold patient until day shift when vital signs are stable.Patient given a pitcheropf Gatorade and encouraged to drink. Will continue to monitor patient.

## 2016-05-07 NOTE — [facility] Suicide Risk Assessment (Signed)
Suicide Risk Assessment  Discharge Assessment   [facility]   Principal Problem: Major depressive disorder, recurrent severe without psychotic features [facility](HCC) Discharge Diagnoses:  Patient Active Problem List   Diagnosis Date Noted  . Major depressive disorder, recurrent severe without psychotic features (HCC) [F33.2] 05/07/2016    Priority: High  . Alcohol dependence (HCC) [F10.20] 05/07/2016    Priority: High    Total Time spent with patient: 45 minutes  Musculoskeletal: Strength & Muscle Tone: within normal limits Gait & Station: normal Patient leans: N/A  Psychiatric Specialty Exam: Physical Exam  Constitutional: He is oriented to person, place, and time. He appears well-developed and well-nourished.  HENT:  Head: Normocephalic.  Neck: Normal range of motion.  Respiratory: Effort normal.  Musculoskeletal: Normal range of motion.  Neurological: He is alert and oriented to person, place, and time.  Skin: Skin is warm and dry.  Psychiatric: His speech is normal and behavior is normal. Judgment and thought content normal. Cognition and memory are normal. He exhibits a depressed mood.    Review of Systems  Constitutional: Negative.   HENT: Negative.   Eyes: Negative.   Respiratory: Negative.   Cardiovascular: Negative.   Gastrointestinal: Negative.   Genitourinary: Negative.   Musculoskeletal: Negative.   Skin: Negative.   Neurological: Negative.   Endo/Heme/Allergies: Negative.   Psychiatric/Behavioral: Positive for depression and substance abuse.    Blood pressure 188/99, pulse 101, temperature 97.7 F (36.5 C), temperature source Oral, resp. rate 16, SpO2 100 %.There is no height or weight on file to calculate BMI.  General Appearance: Casual  Eye Contact:  Good  Speech:  Normal Rate  Volume:  Normal  Mood:  Depressed  Affect:  Congruent  Thought Process:  Coherent and Descriptions of Associations: Intact  Orientation:  Full  (Time, Place, and Person)  Thought Content:  WDL  Suicidal Thoughts:  No  Homicidal Thoughts:  No  Memory:  Immediate;   Good Recent;   Good Remote;   Good  Judgement:  Fair  Insight:  Fair  Psychomotor Activity:  Normal  Concentration:  Concentration: Good and Attention Span: Good  Recall:  Good  Fund of Knowledge:  Good  Language:  Good  Akathisia:  No  Handed:  Right  AIMS (if indicated):     Assets:  Leisure Time Physical Health Resilience Social Support  ADL's:  Intact  Cognition:  WNL  Sleep:       Mental Status Per Nursing Assessment::   On Admission:   alcohol abuse with suicidal ideations  Demographic Factors:  Male  Loss Factors: Financial problems/change in socioeconomic status  Historical Factors: NA  Risk Reduction Factors:   Sense of responsibility to family, Living with another person, especially a relative and Positive social support  Continued Clinical Symptoms:  Depression, moderate  Cognitive Features That Contribute To Risk:  None    Suicide Risk:  Minimal: No identifiable suicidal ideation.  Patients presenting with no risk factors but with morbid ruminations; may be classified as minimal risk based on the severity of the depressive symptoms    Plan Of Care/Follow-up reas toleratedmendations:  Activity:  as tolerated Diet:  heart healthy diet  LORD, JAMISON, NP 05/07/2016, 11:02 AM

## 2016-05-07 NOTE — Consult Note (Signed)
[place] Psychiatry Consult    Reason for Consult:  Alcohol dependence with suicidal ideations Referring Physician:  EDP Patient Identification: Angel Phillips MRN:  [id number] Principal Diagnosis: Major depressive disorder, recurrent severe without psychotic features [facility]) Diagnosis:   Patient Active Problem List   Diagnosis Date Noted  . Major depressive disorder, recurrent severe without psychotic features ([place]) [F33.2] 05/07/2016    Priority: High  . Alcohol dependence ([place]) [F10.20] 05/07/2016    Priority: High    Total Time spent with patient: 45 minutes  Subjective:   Angel Phillips is a 55 y.o. male patient does not warrant admission.  HPI:  55 yo male who presented to the ED with alcohol intoxication and suicidal ideations due to being evicted.  Today, he is very concerned about obtaining his belongings and place in a rental unit until he can move them to [place] where his brother wants him to come live with him.  Denies suicidal/homicidal ideations, hallucinations, and withdrawal symptoms.  Once collateral information from his brother is obtained for safety, he can discharge.  Past Psychiatric History: alcohol abuse, depression  Risk to Self: Suicidal Ideation: Yes-Currently Present Suicidal Intent: Yes-Currently Present Is patient at risk for suicide?: Yes Suicidal Plan?: Yes-Currently Present Specify Current Suicidal Plan:  (drink self to death) Access to Means: Yes Specify Access to Suicidal Means:  (liqour and beer) What has been your use of drugs/alcohol within the last 12 months?:  (patient reports alcohol, cocaine, and thc use ) How many times?:  (0) Other Self Harm Risks:  (denies ) Triggers for Past Attempts:  (no previous attempts or gestures ) Intentional Self Injurious Behavior: None Risk to Others: Homicidal Ideation: No Thoughts of Harm to Others: No Current Homicidal Intent: No Current Homicidal Plan: No Access to Homicidal Means: No Identified  Victim:  (n/a) History of harm to others?: No Assessment of Violence: None Noted Does patient have access to weapons?: No Criminal Charges Pending?: No Does patient have a court date: No Prior Inpatient Therapy: Prior Inpatient Therapy: No Prior Therapy Dates:  (n/a) Prior Therapy Facilty/Provider(s):  (n/a) Reason for Treatment:  (n/a) Prior Outpatient Therapy: Prior Outpatient Therapy: Yes Prior Therapy Dates:  (current) Prior Therapy Facilty/Provider(s):  ([facility] ) Reason for Treatment:  (substance abuse) Does patient have an ACCT team?: No Does patient have Intensive In-House Services?  : No Does patient have [facility] services? : No Does patient have P4CC services?: No  Past Medical History: No past medical history on file. No past surgical history on file. Family History: No family history on file. Family Psychiatric  History: none Social History:  History  Alcohol Use  . Yes     History  Drug use: Unknown    Social History   Social History  . Marital status: Single    Spouse name: N/A  . Number of children: N/A  . Years of education: N/A   Social History Main Topics  . Smoking status: Not on file  . Smokeless tobacco: Not on file  . Alcohol use Yes  . Drug use: Unknown  . Sexual activity: Not on file   Other Topics Concern  . Not on file   Social History Narrative  . No narrative on file   Additional Social History:    Allergies:  No Known Allergies  Labs:  Results for orders placed or performed during the hospital encounter of 05/06/16 (from the past 48 hour(s))  Urinalysis, Routine w reflex microscopic     Status:  Abnormal   Collection Time: 05/06/16  9:40 AM  Result Value Ref Range   Color, Urine YELLOW YELLOW   APPearance CLEAR CLEAR   Specific Gravity, Urine 1.008 1.005 - 1.030   pH 6.0 5.0 - 8.0   Glucose, UA NEGATIVE NEGATIVE mg/dL   Hgb urine dipstick NEGATIVE NEGATIVE   Bilirubin Urine NEGATIVE NEGATIVE   Ketones, ur  NEGATIVE NEGATIVE mg/dL   Protein, ur 30 (A) NEGATIVE mg/dL   Nitrite NEGATIVE NEGATIVE   Leukocytes, UA LARGE (A) NEGATIVE  Urine rapid drug screen (hosp performed)     Status: Abnormal   Collection Time: 05/06/16  9:40 AM  Result Value Ref Range   Opiates NONE DETECTED NONE DETECTED   Cocaine POSITIVE (A) NONE DETECTED   Benzodiazepines NONE DETECTED NONE DETECTED   Amphetamines NONE DETECTED NONE DETECTED   Tetrahydrocannabinol NONE DETECTED NONE DETECTED   Barbiturates NONE DETECTED NONE DETECTED    Comment:        DRUG SCREEN FOR MEDICAL PURPOSES ONLY.  IF CONFIRMATION IS NEEDED FOR ANY PURPOSE, NOTIFY LAB WITHIN 5 DAYS.        LOWEST DETECTABLE LIMITS FOR URINE DRUG SCREEN Drug Class       Cutoff (ng/mL) Amphetamine      1000 Barbiturate      200 Benzodiazepine   549 Tricyclics       826 Opiates          300 Cocaine          300 THC              50   Urine microscopic-add on     Status: Abnormal   Collection Time: 05/06/16  9:40 AM  Result Value Ref Range   Squamous Epithelial / LPF 0-5 (A) NONE SEEN   WBC, UA TOO NUMEROUS TO COUNT 0 - 5 WBC/hpf   RBC / HPF NONE SEEN 0 - 5 RBC/hpf   Bacteria, UA MANY (A) NONE SEEN  Comprehensive metabolic panel     Status: Abnormal   Collection Time: 05/06/16 10:35 AM  Result Value Ref Range   Sodium 140 135 - 145 mmol/L   Potassium 3.0 (L) 3.5 - 5.1 mmol/L   Chloride 102 101 - 111 mmol/L   CO2 26 22 - 32 mmol/L   Glucose, Bld 147 (H) 65 - 99 mg/dL   BUN 11 6 - 20 mg/dL   Creatinine, Ser 1.20 0.61 - 1.24 mg/dL   Calcium 8.6 (L) 8.9 - 10.3 mg/dL   Total Protein 6.7 6.5 - 8.1 g/dL   Albumin 3.6 3.5 - 5.0 g/dL   AST 28 15 - 41 U/L   ALT 15 (L) 17 - 63 U/L   Alkaline Phosphatase 69 38 - 126 U/L   Total Bilirubin 0.4 0.3 - 1.2 mg/dL   GFR calc non Af Amer >60 >60 mL/min   GFR calc Af Amer >60 >60 mL/min    Comment: (NOTE) The eGFR has been calculated using the CKD EPI equation. This calculation has not been validated in all  clinical situations. eGFR's persistently <60 mL/min signify possible Chronic Kidney Disease.    Anion gap 12 5 - 15  Ethanol     Status: Abnormal   Collection Time: 05/06/16 10:35 AM  Result Value Ref Range   Alcohol, Ethyl (B) 291 (H) <5 mg/dL    Comment:        LOWEST DETECTABLE LIMIT FOR SERUM ALCOHOL IS 5 mg/dL FOR MEDICAL PURPOSES ONLY  Lipase, blood     Status: None   Collection Time: 05/06/16 10:35 AM  Result Value Ref Range   Lipase 28 11 - 51 U/L  CBC with Differential     Status: Abnormal   Collection Time: 05/06/16 10:35 AM  Result Value Ref Range   WBC 3.6 (L) 4.0 - 10.5 K/uL   RBC 4.59 4.22 - 5.81 MIL/uL   Hemoglobin 12.8 (L) 13.0 - 17.0 g/dL   HCT 39.0 39.0 - 52.0 %   MCV 85.0 78.0 - 100.0 fL   MCH 27.9 26.0 - 34.0 pg   MCHC 32.8 30.0 - 36.0 g/dL   RDW 14.1 11.5 - 15.5 %   Platelets 533 (H) 150 - 400 K/uL   Neutrophils Relative % 49 %   Neutro Abs 1.8 1.7 - 7.7 K/uL   Lymphocytes Relative 41 %   Lymphs Abs 1.5 0.7 - 4.0 K/uL   Monocytes Relative 4 %   Monocytes Absolute 0.1 0.1 - 1.0 K/uL   Eosinophils Relative 5 %   Eosinophils Absolute 0.2 0.0 - 0.7 K/uL   Basophils Relative 1 %   Basophils Absolute 0.0 0.0 - 0.1 K/uL    Current Facility-Administered Medications  Medication Dose Route Frequency Provider Last Rate Last Dose  . haloperidol (HALDOL) tablet 5 mg  5 mg Oral Q6H PRN Carmin Muskrat, MD       And  . benztropine (COGENTIN) tablet 1 mg  1 mg Oral Q6H PRN Carmin Muskrat, MD      . folic acid (FOLVITE) tablet 1 mg  1 mg Oral Daily Patrecia Pour, NP   1 mg at 05/06/16 1818  . haloperidol lactate (HALDOL) injection 5 mg  5 mg Intramuscular Q6H PRN Carmin Muskrat, MD      . LORazepam (ATIVAN) tablet 1 mg  1 mg Oral Q6H PRN Patrecia Pour, NP   1 mg at 05/06/16 2033   Or  . LORazepam (ATIVAN) injection 1 mg  1 mg Intravenous Q6H PRN Patrecia Pour, NP      . LORazepam (ATIVAN) tablet 0-4 mg  0-4 mg Oral Q6H Patrecia Pour, NP   2 mg at  05/07/16 0962   Followed by  . [START ON 05/08/2016] LORazepam (ATIVAN) tablet 0-4 mg  0-4 mg Oral Q12H Patrecia Pour, NP      . multivitamin with minerals tablet 1 tablet  1 tablet Oral Daily Patrecia Pour, NP   1 tablet at 05/06/16 1818  . thiamine (VITAMIN B-1) tablet 100 mg  100 mg Oral Daily Patrecia Pour, NP   100 mg at 05/06/16 1819   Or  . thiamine (B-1) injection 100 mg  100 mg Intravenous Daily Patrecia Pour, NP       Current Outpatient Prescriptions  Medication Sig Dispense Refill  . amLODipine (NORVASC) 10 MG tablet Take 10 mg by mouth daily.    . chlorthalidone (HYGROTON) 25 MG tablet Take 25 mg by mouth daily.    . DULoxetine (CYMBALTA) 60 MG capsule Take 60 mg by mouth daily.    Marland Kitchen gabapentin (NEURONTIN) 300 MG capsule Take 300 mg by mouth 3 (three) times daily.    Marland Kitchen lisinopril (PRINIVIL,ZESTRIL) 40 MG tablet Take 40 mg by mouth daily.    . metFORMIN (GLUCOPHAGE) 1000 MG tablet Take 1,000 mg by mouth 2 (two) times daily with a meal.    . metoprolol tartrate (LOPRESSOR) 25 MG tablet Take 25 mg by mouth 2 (two) times  daily.    . Multiple Vitamins-Minerals (MULTIVITAMIN ADULT) TABS Take 1 tablet by mouth daily.    . simvastatin (ZOCOR) 40 MG tablet Take 40 mg by mouth daily.    . tamsulosin (FLOMAX) 0.4 MG CAPS capsule Take 0.4 mg by mouth daily after supper.      Musculoskeletal: Strength & Muscle Tone: within normal limits Gait & Station: normal Patient leans: N/A  Psychiatric Specialty Exam: Physical Exam  Constitutional: He is oriented to person, place, and time. He appears well-developed and well-nourished.  HENT:  Head: Normocephalic.  Neck: Normal range of motion.  Respiratory: Effort normal.  Musculoskeletal: Normal range of motion.  Neurological: He is alert and oriented to person, place, and time.  Skin: Skin is warm and dry.  Psychiatric: His speech is normal and behavior is normal. Judgment and thought content normal. Cognition and memory are normal. He  exhibits a depressed mood.    Review of Systems  Constitutional: Negative.   HENT: Negative.   Eyes: Negative.   Respiratory: Negative.   Cardiovascular: Negative.   Gastrointestinal: Negative.   Genitourinary: Negative.   Musculoskeletal: Negative.   Skin: Negative.   Neurological: Negative.   Endo/Heme/Allergies: Negative.   Psychiatric/Behavioral: Positive for depression and substance abuse.    Blood pressure 188/99, pulse 101, temperature 97.7 F (36.5 C), temperature source Oral, resp. rate 16, SpO2 100 %.There is no height or weight on file to calculate BMI.  General Appearance: Casual  Eye Contact:  Good  Speech:  Normal Rate  Volume:  Normal  Mood:  Depressed  Affect:  Congruent  Thought Process:  Coherent and Descriptions of Associations: Intact  Orientation:  Full (Time, Place, and Person)  Thought Content:  WDL  Suicidal Thoughts:  No  Homicidal Thoughts:  No  Memory:  Immediate;   Good Recent;   Good Remote;   Good  Judgement:  Fair  Insight:  Fair  Psychomotor Activity:  Normal  Concentration:  Concentration: Good and Attention Span: Good  Recall:  Good  Fund of Knowledge:  Good  Language:  Good  Akathisia:  No  Handed:  Right  AIMS (if indicated):     Assets:  Leisure Time Physical Health Resilience Social Support  ADL's:  Intact  Cognition:  WNL  Sleep:        Treatment Plan Summary: Daily contact with patient to assess and evaluate symptoms and progress in treatment, Medication management and Plan major depressive disorder, recurrent, moderate:  -Crisis stabilization -Medication management:  Ativan alcohol detox protocol in place, restarted home medications along with gabapentin 300 mg TID for alcohol dependence and Cymbalta 60 mg daily for depression -Individual and substance abuse counseling  Disposition: No evidence of imminent risk to self or others at present.    Waylan Boga, NP 05/07/2016 10:01 AM

## 2016-05-08 DIAGNOSIS — F1020 Alcohol dependence, uncomplicated: Secondary | ICD-10-CM | POA: Diagnosis not present

## 2016-05-08 LAB — CBG MONITORING, ED: GLUCOSE-CAPILLARY: 109 mg/dL — AB (ref 65–99)

## 2016-05-08 MED ORDER — GABAPENTIN 300 MG PO CAPS: 300.0000 mg | ORAL_CAPSULE | Freq: Three times a day (TID) | ORAL | 0 refills | Status: AC

## 2016-05-08 MED ORDER — DULOXETINE HCL 60 MG PO CPEP: 60.0000 mg | ORAL_CAPSULE | Freq: Every day | ORAL | 0 refills | Status: AC

## 2016-05-08 NOTE — Progress Notes (Signed)
05/08/16 1350:  Pt did not participate in activity.   Angel Phillips, LRT/CTRS 

## 2016-05-08 NOTE — ED Notes (Signed)
Family at bedside. 

## 2016-05-08 NOTE — Discharge Instructions (Addendum)
Alcohol Use Disorder Alcohol use disorder is a mental disorder. It is not a one-time incident of heavy drinking. Alcohol use disorder is the excessive and uncontrollable use of alcohol over time that leads to problems with functioning in one or more areas of daily living. People with this disorder risk harming themselves and others when they drink to excess. Alcohol use disorder also can cause other mental disorders, such as mood and anxiety disorders, and serious physical problems. People with alcohol use disorder often misuse other drugs.  Alcohol use disorder is common and widespread. Some people with this disorder drink alcohol to cope with or escape from negative life events. Others drink to relieve chronic pain or symptoms of mental illness. People with a family history of alcohol use disorder are at higher risk of losing control and using alcohol to excess.  Drinking too much alcohol can cause injury, accidents, and health problems. One drink can be too much when you are:  Working.  Pregnant or breastfeeding.  Taking medicines. Ask your doctor.  Driving or planning to drive. SYMPTOMS  Signs and symptoms of alcohol use disorder may include the following:   Consumption ofalcohol inlarger amounts or over a longer period of time than intended.  Multiple unsuccessful attempts to cutdown or control alcohol use.   A great deal of time spent obtaining alcohol, using alcohol, or recovering from the effects of alcohol (hangover).  A strong desire or urge to use alcohol (cravings).   Continued use of alcohol despite problems at work, school, or home because of alcohol use.   Continued use of alcohol despite problems in relationships because of alcohol use.  Continued use of alcohol in situations when it is physically hazardous, such as driving a car.  Continued use of alcohol despite awareness of a physical or psychological problem that is likely related to alcohol use. Physical  problems related to alcohol use can involve the brain, heart, liver, stomach, and intestines. Psychological problems related to alcohol use include intoxication, depression, anxiety, psychosis, delirium, and dementia.   The need for increased amounts of alcohol to achieve the same desired effect, or a decreased effect from the consumption of the same amount of alcohol (tolerance).  Withdrawal symptoms upon reducing or stopping alcohol use, or alcohol use to reduce or avoid withdrawal symptoms. Withdrawal symptoms include:  Racing heart.  Hand tremor.  Difficulty sleeping.  Nausea.  Vomiting.  Hallucinations.  Restlessness.  Seizures. DIAGNOSIS Alcohol use disorder is diagnosed through an assessment by your health care provider. Your health care provider may start by asking three or four questions to screen for excessive or problematic alcohol use. To confirm a diagnosis of alcohol use disorder, at least two symptoms must be present within a 66-month period. The severity of alcohol use disorder depends on the number of symptoms:  Mild--two or three.  Moderate--four or five.  Severe--six or more. Your health care provider may perform a physical exam or use results from lab tests to see if you have physical problems resulting from alcohol use. Your health care provider may refer you to a mental health professional for evaluation. TREATMENT  Some people with alcohol use disorder are able to reduce their alcohol use to low-risk levels. Some people with alcohol use disorder need to quit drinking alcohol. When necessary, mental health professionals with specialized training in substance use treatment can help. Your health care provider can help you decide how severe your alcohol use disorder is and what type of treatment you need.  The following forms of treatment are available:   Detoxification. Detoxification involves the use of prescription medicines to prevent alcohol withdrawal  symptoms in the first week after quitting. This is important for people with a history of symptoms of withdrawal and for heavy drinkers who are likely to have withdrawal symptoms. Alcohol withdrawal can be dangerous and, in severe cases, cause death. Detoxification is usually provided in a hospital or in-patient substance use treatment facility.  Counseling or talk therapy. Talk therapy is provided by substance use treatment counselors. It addresses the reasons people use alcohol and ways to keep them from drinking again. The goals of talk therapy are to help people with alcohol use disorder find healthy activities and ways to cope with life stress, to identify and avoid triggers for alcohol use, and to handle cravings, which can cause relapse.  Medicines.Different medicines can help treat alcohol use disorder through the following actions:  Decrease alcohol cravings.  Decrease the positive reward response felt from alcohol use.  Produce an uncomfortable physical reaction when alcohol is used (aversion therapy).  Support groups. Support groups are run by people who have quit drinking. They provide emotional support, advice, and guidance. These forms of treatment are often combined. Some people with alcohol use disorder benefit from intensive combination treatment provided by specialized substance use treatment centers. Both inpatient and outpatient treatment programs are available.   This information is not intended to replace advice given to you by your health care provider. Make sure you discuss any questions you have with your health care provider.  1.Take all your medications as prescribed.  2. Report any adverse side effects to your medication to your outpatient provider. 3. Do not use alcohol or illegal drugs while taking prescription medications. 4. In the event of worsening symptoms, call 911, the crisis hotline or go to nearest emergency room for evaluation of symptoms.   Document  Released: 10/23/2004 Document Revised: 10/06/2014 Document Reviewed: 12/23/2012 Elsevier Interactive Patient Education [employer]2016 [employer].  To help you maintain a sober lifestyle, a substance abuse treatment program may be beneficial to you.  You have indicated that you are interested in the program offered by the [facility] in [place].  To ask about enrolling in their program, you can either contact them directly, or go through the [facility].  Contact them at your earliest opportunity to ask about enrolling in this program:       [facility]      [address]      [place] [id number]      Contact person: Cephas DarbyLinda Richards      [phone]       [facility]      [address]      [place], [place] [address]      [phone]

## 2016-05-08 NOTE — BHH Suicide Risk Assessment (Signed)
Suicide Risk Assessment  Discharge Assessment   [facility]   Principal Problem: Alcohol dependence [facility](HCC) Discharge Diagnoses:  Patient Active Problem List   Diagnosis Date Noted  . Major depressive disorder, recurrent severe without psychotic features ([facility]) [F33.2] 05/07/2016  . Alcohol dependence ([facility]) [F10.20] 05/07/2016    Total Time spent with patient: 20 minutes  Musculoskeletal: Strength & Muscle Tone: within normal limits Gait & Station: normal Patient leans: N/A  Psychiatric Specialty Exam: Blood pressure 142/78, pulse 72, temperature 98.1 F (36.7 C), temperature source Oral, resp. rate 20, SpO2 100 %.There is no height or weight on file to calculate BMI.  General Appearance: Fairly Groomed  Eye Contact:  Good  Speech:  Clear and Coherent and Normal Rate  Volume:  Normal  Mood:  Depressed  Affect:  Flat  Thought Process:  Coherent  Orientation:  Full (Time, Place, and Person)  Thought Content:  No psychosis  Suicidal Thoughts:  No  Homicidal Thoughts:  No  Memory:  Immediate;   Good Recent;   Good Remote;   Good  Judgement:  Intact  Insight:  Fair  Psychomotor Activity:  Normal  Concentration:  Concentration: Fair and Attention Span: Fair  Recall:  Good  Fund of Knowledge:  Good  Language:  Good  Akathisia:  No  Handed:  Right  AIMS (if indicated):     Assets:  Communication Skills Desire for Improvement Resilience  ADL's:  Intact  Cognition:  WNL  Sleep:      Mental Status Per Nursing Assessment::   On Admission:   Alcohol abuse with suicidal ideation  Demographic Factors:  Male, Low socioeconomic status and Unemployed  Loss Factors: Financial problems/change in socioeconomic status  Historical Factors: NA  Risk Reduction Factors:   Sense of responsibility to family  Continued Clinical Symptoms:  Depression:   Comorbid alcohol abuse/dependence  Cognitive Features That Contribute To Risk:  None    Suicide  Risk:  Minimal: No identifiable suicidal ideation.  Patients presenting with no risk factors but with morbid ruminations; may be classified as minimal risk based on the severity of the depressive symptoms     Plan Of Care/Follow-up recommendations:  Activity: As tolerated Diet: Regular Tests: As determined by PCP 1.Take all your medications as prescribed.  2. Report any adverse side effects to your medication to your outpatient provider. 3. Do not use alcohol or illegal drugs while taking prescription medications. 4. In the event of worsening symptoms, call 911, the crisis hotline or go to nearest emergency room for evaluation of symptoms.    Alberteen SamFran Khalea Ventura, FNP-BC [facility] 05/08/2016, 1:47 PM

## 2016-05-08 NOTE — BH Assessment (Signed)
[facility] Assessment Progress Note  Per Leata MouseJanardhana Jonnalagadda, MD, this pt does not require psychiatric hospitalization at this time.  Pt is requesting transfer to a residential substance abuse treatment program at the [facility] [place].  Pt also indicates that he must leave [facility] by tomorrow in order to arrange for storage for his personal belongings, given that he has been evicted from his household.  Pt is under voluntary status, and per Dr Elsie SaasJonnalagadda, he does not meet criteria for IVC.  At 13:17 I called the [facility] and spoke to Bridge[place]Melinda DeHart [phone]  She reports that there is a lengthy admission process for entering this program involving obtaining an admission packet and scheduling a screening appointment.  Pt would need to present in person for this with an understanding that he must be sober for at least 48 hours, and that their earliest available appointments are on 05/26/2016.  Alternatively pt can [facility] through [facility], his regular outpatient provider, and they can refer him to the program.  After staffing these findings with Alberteen SamFran Hobson, NP and discussing them with the pt, it has been decided that [facility] with the aforesaid details included in pt's discharge instructions.  Pt will also need area supportive services for the homeless.  Details about the [facility] program have been included in pt's discharge instructions, along with contact information for both the [place] [facility] and the [place].  Pt's nurse, Dawnaly, has been notified, and she has been given printed information about area supportive services for the homeless to give to the pt upon discharge.  Angel Canninghomas Danashia Landers, MA Triage Specialist [phone]

## 2016-05-08 NOTE — ED Notes (Signed)
Patient discharged to home.  Denies thoughts of harm to self or others.  Last CIWA was negative.  All belongings returned and signed for.  Patient left the unit ambulatory and was escorted to the front lobby.  He was given a cab voucher and the cab was waiting for him when he arrived at the lobby.

## 2016-05-08 NOTE — Progress Notes (Addendum)
Staffed with Nurse and was informed patient would need a taxi to home for discharge. Voucher approved by Asst. Director of Social Work and given to Engineer, civil (consulting)urse.   Elenore PaddyLaVonia Glenard Keesling, LCSWA [phone] ED CSW 05/08/2016 2:16 PM

## 2016-05-08 NOTE — Consult Note (Signed)
[facility] Face-to-Face Psychiatry Consult   Reason for Consult:  Psychiatric Re-Evaluation Referring Physician:  EDP Patient Identification: Angel Phillips MRN:  [id number] Principal Diagnosis: Major depressive disorder, recurrent severe without psychotic features [facility]) Diagnosis:   Patient Active Problem List   Diagnosis Date Noted  . Major depressive disorder, recurrent severe without psychotic features (HCC) [F33.2] 05/07/2016  . Alcohol dependence (HCC) [F10.20] 05/07/2016    Total Time spent with patient: 30 minutes  Subjective:   Angel Phillips is a 55 y.o. male patient who states "I'm being evicted from my house."  HPI:  Angel Phillips is a 55 yo African American male who presented to [facility] ED on 05/06/16 for evaluation of depression and suicidal ideation. Law enforcement went to the patient's home to serve him with eviction papers and upon arrival found him unresponsive and was brought to the ED.   He is seen today face-to-face with Dr. Elsie Saas. He states that he is being evicted from his home and does not have any where to go. He denies having a support system in this area, stating that his family is in [place], [place]. He states he used to receive $3,028 a month from the [place] but that was reduced to $800 in June. He states he fell behind in bills and was having to take out "payday loans" to cover his bills. He states he needs help with his alcohol use. He denies suicidal or homicidal ideation, intent or plan. He denies AVH. His CIWA is zero and is not exhibiting any signs of acute withdrawal.  Past Psychiatric History: Depression, Alcohol dependence  Risk to Self: Suicidal Ideation: Yes-Currently Present Suicidal Intent: Yes-Currently Present Is patient at risk for suicide?: Yes Suicidal Plan?: Yes-Currently Present Specify Current Suicidal Plan:  (drink self to death) Access to Means: Yes Specify Access to Suicidal Means:  (liqour and beer) What has been your use of drugs/alcohol  within the last 12 months?:  (patient reports alcohol, cocaine, and thc use ) How many times?:  (0) Other Self Harm Risks:  (denies ) Triggers for Past Attempts:  (no previous attempts or gestures ) Intentional Self Injurious Behavior: None Risk to Others: Homicidal Ideation: No Thoughts of Harm to Others: No Current Homicidal Intent: No Current Homicidal Plan: No Access to Homicidal Means: No Identified Victim:  (n/a) History of harm to others?: No Assessment of Violence: None Noted Does patient have access to weapons?: No Criminal Charges Pending?: No Does patient have a court date: No Prior Inpatient Therapy: Prior Inpatient Therapy: No Prior Therapy Dates:  (n/a) Prior Therapy Facilty/Provider(s):  (n/a) Reason for Treatment:  (n/a) Prior Outpatient Therapy: Prior Outpatient Therapy: Yes Prior Therapy Dates:  (current) Prior Therapy Facilty/Provider(s):  [facility] ) Reason for Treatment:  (substance abuse) Does patient have an ACCT team?: No Does patient have Intensive In-House Services?  : No Does patient have [facility] services? : No Does patient have P4CC services?: No  Past Medical History: No past medical history on file. No past surgical history on file. Family History: No family history on file. Family Psychiatric  History:  unknown Social History:  History  Alcohol Use  . Yes     History  Drug use: Unknown    Social History   Social History  . Marital status: Single    Spouse name: N/A  . Number of children: N/A  . Years of education: N/A   Social History Main Topics  . Smoking status: Not on file  . Smokeless tobacco:  Not on file  . Alcohol use Yes  . Drug use: Unknown  . Sexual activity: Not on file   Other Topics Concern  . Not on file   Social History Narrative  . No narrative on file   Additional Social History:    Allergies:  No Known Allergies  Labs:  Results for orders placed or performed during the hospital encounter of  05/06/16 (from the past 48 hour(s))  CBG monitoring, ED     Status: Abnormal   Collection Time: 05/08/16 12:17 PM  Result Value Ref Range   Glucose-Capillary 109 (H) 65 - 99 mg/dL    Current Facility-Administered Medications  Medication Dose Route Frequency Provider Last Rate Last Dose  . amLODipine (NORVASC) tablet 10 mg  10 mg Oral Daily Charm Rings, NP   10 mg at 05/08/16 0959  . atorvastatin (LIPITOR) tablet 20 mg  20 mg Oral q1800 Drew A Wofford, RPH      . haloperidol (HALDOL) tablet 5 mg  5 mg Oral Q6H PRN Gerhard Munch, MD       And  . benztropine (COGENTIN) tablet 1 mg  1 mg Oral Q6H PRN Gerhard Munch, MD      . chlorthalidone (HYGROTON) tablet 25 mg  25 mg Oral Daily Charm Rings, NP   25 mg at 05/08/16 0959  . DULoxetine (CYMBALTA) DR capsule 60 mg  60 mg Oral Daily Charm Rings, NP   60 mg at 05/08/16 0959  . folic acid (FOLVITE) tablet 1 mg  1 mg Oral Daily Charm Rings, NP   1 mg at 05/08/16 0959  . gabapentin (NEURONTIN) capsule 300 mg  300 mg Oral TID Charm Rings, NP   300 mg at 05/08/16 0959  . haloperidol lactate (HALDOL) injection 5 mg  5 mg Intramuscular Q6H PRN Gerhard Munch, MD      . lisinopril (PRINIVIL,ZESTRIL) tablet 40 mg  40 mg Oral Daily Charm Rings, NP   40 mg at 05/08/16 0959  . LORazepam (ATIVAN) tablet 1 mg  1 mg Oral Q6H PRN Charm Rings, NP   1 mg at 05/06/16 2033   Or  . LORazepam (ATIVAN) injection 1 mg  1 mg Intravenous Q6H PRN Charm Rings, NP      . LORazepam (ATIVAN) tablet 0-4 mg  0-4 mg Oral Q6H Charm Rings, NP   2 mg at 05/08/16 0509   Followed by  . LORazepam (ATIVAN) tablet 0-4 mg  0-4 mg Oral Q12H Charm Rings, NP      . metFORMIN (GLUCOPHAGE) tablet 1,000 mg  1,000 mg Oral BID WC Charm Rings, NP   1,000 mg at 05/08/16 0813  . metoprolol tartrate (LOPRESSOR) tablet 25 mg  25 mg Oral BID Charm Rings, NP   25 mg at 05/08/16 0959  . multivitamin with minerals tablet 1 tablet  1 tablet Oral Daily Charm Rings, NP   1 tablet at 05/08/16 0959  . tamsulosin (FLOMAX) capsule 0.4 mg  0.4 mg Oral QPC supper Charm Rings, NP   0.4 mg at 05/07/16 1724  . thiamine (VITAMIN B-1) tablet 100 mg  100 mg Oral Daily Charm Rings, NP   100 mg at 05/08/16 1610   Or  . thiamine (B-1) injection 100 mg  100 mg Intravenous Daily Charm Rings, NP       Current Outpatient Prescriptions  Medication Sig Dispense Refill  . amLODipine (NORVASC) 10  MG tablet Take 10 mg by mouth daily.    . chlorthalidone (HYGROTON) 25 MG tablet Take 25 mg by mouth daily.    . DULoxetine (CYMBALTA) 60 MG capsule Take 60 mg by mouth daily.    Marland Kitchen gabapentin (NEURONTIN) 300 MG capsule Take 300 mg by mouth 3 (three) times daily.    Marland Kitchen lisinopril (PRINIVIL,ZESTRIL) 40 MG tablet Take 40 mg by mouth daily.    . metFORMIN (GLUCOPHAGE) 1000 MG tablet Take 1,000 mg by mouth 2 (two) times daily with a meal.    . metoprolol tartrate (LOPRESSOR) 25 MG tablet Take 25 mg by mouth 2 (two) times daily.    . Multiple Vitamins-Minerals (MULTIVITAMIN ADULT) TABS Take 1 tablet by mouth daily.    . simvastatin (ZOCOR) 40 MG tablet Take 40 mg by mouth daily.    . tamsulosin (FLOMAX) 0.4 MG CAPS capsule Take 0.4 mg by mouth daily after supper.      Musculoskeletal: Strength & Muscle Tone: within normal limits Gait & Station: normal Patient leans: N/A  Psychiatric Specialty Exam: Physical Exam  Constitutional: He is oriented to person, place, and time. He appears well-developed and well-nourished.  HENT:  Head: Normocephalic.  Neck: Normal range of motion.  Respiratory: Effort normal.  Musculoskeletal: Normal range of motion.  Neurological: He is alert and oriented to person, place, and time.  Skin: Skin is warm and dry.  Psychiatric:  See psychiatric specialty exam    Review of Systems  Constitutional: Negative.   HENT: Negative.   Eyes: Negative.   Respiratory: Negative.   Cardiovascular: Negative.   Gastrointestinal: Negative.    Genitourinary: Negative.   Musculoskeletal: Negative.   Skin: Negative.   Neurological: Negative.   Endo/Heme/Allergies: Negative.   Psychiatric/Behavioral: Positive for depression and substance abuse.    Blood pressure 142/78, pulse 72, temperature 98.1 F (36.7 C), temperature source Oral, resp. rate 20, SpO2 100 %.There is no height or weight on file to calculate BMI.  General Appearance: Fairly Groomed  Eye Contact:  Good  Speech:  Clear and Coherent and Normal Rate  Volume:  Normal  Mood:  Depressed  Affect:  Flat  Thought Process:  Coherent  Orientation:  Full (Time, Place, and Person)  Thought Content:  No psychosis  Suicidal Thoughts:  No  Homicidal Thoughts:  No  Memory:  Immediate;   Good Recent;   Good Remote;   Good  Judgement:  Intact  Insight:  Fair  Psychomotor Activity:  Normal  Concentration:  Concentration: Fair and Attention Span: Fair  Recall:  Good  Fund of Knowledge:  Good  Language:  Good  Akathisia:  No  Handed:  Right  AIMS (if indicated):     Assets:  Communication Skills Desire for Improvement Resilience  ADL's:  Intact  Cognition:  WNL  Sleep:       Treatment Plan Summary: Case discussed with Dr. Elsie Saas; recommendations are: -Seek placement at [facility] for inpatient residential treatment. -If unable to secure immediate bed placement, patient may be discharged home with instructions for outpatient follow up to the [place].   Disposition: No evidence of imminent risk to self or others at present.   Patient does not meet criteria for psychiatric inpatient admission.  Alberteen Sam, FNP-BC [facility] 05/08/2016 12:48 PM   Patient seen face to face for this evaluation, case discussed with treatment team and physician extender and formulated treatment plan. Reviewed the information documented and agree with the treatment plan.   Beau Ramsburg  [facility] Center[facility]ONNALAGADDA 05/12/2016 10:27 AM

## 2016-05-08 NOTE — BHH Counselor (Signed)
BHH Assessment Progress Note  Writer rec'd t/c from pt's ex-girlfriend and emergency contact on file, [employer]amela Basenight. Rinaldo Cloudamela indicated that she has not seen or spoken to pt in 2 years, so she is not aware of anything happening in his life currently. Rinaldo Cloudamela reported that she attempted to contact any of pt's family members, but was unsuccessful in doing so. Rinaldo Cloudamela intimated that she is not able to pick pt up or help any further in disposition for him.   Johny ShockSamantha M. Ladona Ridgelaylor, MS, NCC, LPCA Counselor

## 2016-05-09 ENCOUNTER — Encounter (HOSPITAL_COMMUNITY): Payer: Self-pay | Admitting: Psychiatry

## 2016-07-06 ENCOUNTER — Emergency Department: Admit: 2016-07-06 | Discharge: 2016-07-10

## 2016-07-06 ENCOUNTER — Inpatient Hospital Stay
Admit: 2016-07-06 | Discharge: 2016-07-10 | Disposition: A | Discharge status: Home / Self Care | Admitting: Internal Medicine

## 2016-07-06 DIAGNOSIS — I10 Essential (primary) hypertension: Principal | ICD-10-CM

## 2016-07-06 DIAGNOSIS — N189 Chronic kidney disease, unspecified: Secondary | ICD-10-CM

## 2016-07-06 DIAGNOSIS — Z86718 Personal history of other venous thrombosis and embolism: Secondary | ICD-10-CM

## 2016-07-06 DIAGNOSIS — N179 Acute kidney failure, unspecified: Secondary | ICD-10-CM

## 2016-07-06 DIAGNOSIS — Z79899 Other long term (current) drug therapy: Secondary | ICD-10-CM

## 2016-07-06 DIAGNOSIS — F329 Major depressive disorder, single episode, unspecified: Secondary | ICD-10-CM

## 2016-07-06 DIAGNOSIS — Z7984 Long term (current) use of oral hypoglycemic drugs: Secondary | ICD-10-CM

## 2016-07-06 DIAGNOSIS — Z590 Homelessness: Secondary | ICD-10-CM

## 2016-07-06 DIAGNOSIS — I82411 Acute embolism and thrombosis of right femoral vein: Principal | ICD-10-CM

## 2016-07-06 DIAGNOSIS — R45851 Suicidal ideations: Secondary | ICD-10-CM

## 2016-07-06 DIAGNOSIS — F419 Anxiety disorder, unspecified: Secondary | ICD-10-CM

## 2016-07-06 DIAGNOSIS — F4310 Post-traumatic stress disorder, unspecified: Secondary | ICD-10-CM

## 2016-07-06 DIAGNOSIS — I129 Hypertensive chronic kidney disease with stage 1 through stage 4 chronic kidney disease, or unspecified chronic kidney disease: Secondary | ICD-10-CM

## 2016-07-06 DIAGNOSIS — E1122 Type 2 diabetes mellitus with diabetic chronic kidney disease: Secondary | ICD-10-CM

## 2016-07-06 DIAGNOSIS — M7989 Other specified soft tissue disorders: Secondary | ICD-10-CM

## 2016-07-06 DIAGNOSIS — I82432 Acute embolism and thrombosis of left popliteal vein: Secondary | ICD-10-CM

## 2016-07-06 DIAGNOSIS — R0602 Shortness of breath: Secondary | ICD-10-CM

## 2016-07-06 DIAGNOSIS — R609 Edema, unspecified: Secondary | ICD-10-CM

## 2016-07-06 DIAGNOSIS — F3340 Major depressive disorder, recurrent, in remission, unspecified: Secondary | ICD-10-CM

## 2016-07-06 DIAGNOSIS — I82403 Acute embolism and thrombosis of unspecified deep veins of lower extremity, bilateral: Secondary | ICD-10-CM

## 2016-07-06 DIAGNOSIS — E119 Type 2 diabetes mellitus without complications: Secondary | ICD-10-CM

## 2016-07-06 DIAGNOSIS — E785 Hyperlipidemia, unspecified: Secondary | ICD-10-CM

## 2016-07-06 DIAGNOSIS — F1910 Other psychoactive substance abuse, uncomplicated: Secondary | ICD-10-CM

## 2016-07-06 DIAGNOSIS — I82431 Acute embolism and thrombosis of right popliteal vein: Secondary | ICD-10-CM | POA: Diagnosis not present

## 2016-07-06 DIAGNOSIS — I340 Nonrheumatic mitral (valve) insufficiency: Secondary | ICD-10-CM | POA: Diagnosis not present

## 2016-07-06 DIAGNOSIS — I5030 Unspecified diastolic (congestive) heart failure: Secondary | ICD-10-CM | POA: Diagnosis not present

## 2016-07-06 DIAGNOSIS — F339 Major depressive disorder, recurrent, unspecified: Secondary | ICD-10-CM | POA: Diagnosis not present

## 2016-07-06 DIAGNOSIS — I361 Nonrheumatic tricuspid (valve) insufficiency: Secondary | ICD-10-CM | POA: Diagnosis not present

## 2016-07-06 DIAGNOSIS — R52 Pain, unspecified: Secondary | ICD-10-CM | POA: Diagnosis not present

## 2016-07-06 MED ORDER — HEPARIN SODIUM (PORCINE) 5000 UNIT/ML IJ SOLN
80 [IU]/kg | INTRAVENOUS | Status: DC | PRN
Start: 2016-07-06 — End: 2016-07-08

## 2016-07-06 MED ORDER — CYANOCOBALAMIN 1000 MCG/ML IJ SOLN
INTRAMUSCULAR
Start: 2016-07-06 — End: ?

## 2016-07-06 MED ORDER — ENOXAPARIN SODIUM 100 MG/ML SC SOLN
1 mg/kg | Freq: Once | SUBCUTANEOUS | Status: DC
Start: 2016-07-06 — End: 2016-07-07

## 2016-07-06 MED ORDER — DEXTROSE 50 % IV SOLN
30 mL | INTRAVENOUS | Status: DC | PRN
Start: 2016-07-06 — End: 2016-07-11

## 2016-07-06 MED ORDER — TAMSULOSIN HCL 0.4 MG PO CAPS
Freq: Every day | ORAL
Start: 2016-07-06 — End: ?

## 2016-07-06 MED ORDER — CHLORTHALIDONE 25 MG PO TABS
25 mg | Freq: Every day | ORAL | Status: DC
Start: 2016-07-06 — End: 2016-07-08

## 2016-07-06 MED ORDER — DULOXETINE HCL 60 MG PO CPEP
60 mg | Freq: Every evening | ORAL | Status: DC
Start: 2016-07-06 — End: 2016-07-09

## 2016-07-06 MED ORDER — KETOCONAZOLE 2 % EX CREA
Freq: Every day | TOPICAL
Start: 2016-07-06 — End: ?

## 2016-07-06 MED ORDER — INSULIN ASPART 100 UNIT/ML SC SOLN
0-3 [IU] | Freq: Three times a day (TID) | SUBCUTANEOUS | Status: DC
Start: 2016-07-06 — End: 2016-07-11

## 2016-07-06 MED ORDER — HEPARIN 25000 UNITS/250 ML JX
5-30 [IU]/kg/h | INTRAVENOUS | Status: DC
Start: 2016-07-06 — End: 2016-07-08

## 2016-07-06 MED ORDER — INSULIN ASPART 100 UNIT/ML SC SOLN
0-4 [IU] | Freq: Every evening | SUBCUTANEOUS | Status: DC
Start: 2016-07-06 — End: 2016-07-11

## 2016-07-06 MED ORDER — GABAPENTIN 300 MG PO CAPS
600 mg | Freq: Three times a day (TID) | ORAL | Status: DC
Start: 2016-07-06 — End: 2016-07-11

## 2016-07-06 MED ORDER — HEPARIN SODIUM (PORCINE) 5000 UNIT/ML IJ SOLN
40 [IU]/kg | INTRAVENOUS | Status: DC | PRN
Start: 2016-07-06 — End: 2016-07-08

## 2016-07-06 MED ORDER — ACETAMINOPHEN 325 MG PO TABS
650 mg | ORAL | Status: DC | PRN
Start: 2016-07-06 — End: 2016-07-11

## 2016-07-06 MED ORDER — METFORMIN HCL 1000 MG PO TABS
Freq: Two times a day (BID) | ORAL
Start: 2016-07-06 — End: ?

## 2016-07-06 MED ORDER — SIMVASTATIN 40 MG PO TABS
ORAL | Status: SS
Start: 2016-07-06 — End: 2016-07-10

## 2016-07-06 MED ORDER — HEPARIN SODIUM (PORCINE) 5000 UNIT/ML IJ SOLN
80 [IU]/kg | Freq: Once | INTRAVENOUS | Status: CP
Start: 2016-07-06 — End: ?

## 2016-07-06 MED ORDER — PERFLUTREN LIPID MICROSPHERE 6.52 MG/ML IV SUSP
.2-1.3 mL | INTRAVENOUS | Status: CP | PRN
Start: 2016-07-06 — End: ?

## 2016-07-06 MED ORDER — MIRTAZAPINE 30 MG PO TABS
Freq: Every evening | ORAL
Start: 2016-07-06 — End: ?

## 2016-07-06 MED ORDER — DULOXETINE HCL 60 MG PO CPEP
Freq: Every day | ORAL
Start: 2016-07-06 — End: ?

## 2016-07-06 MED ORDER — GLUCOSE 4 G PO CHEW JX
16 g | ORAL | Status: DC | PRN
Start: 2016-07-06 — End: 2016-07-11

## 2016-07-06 MED ORDER — GABAPENTIN 600 MG PO TABS
Freq: Three times a day (TID) | ORAL
Start: 2016-07-06 — End: ?

## 2016-07-06 MED ORDER — DEXTROSE 50 % IV SOLN
30 mL | INTRAVENOUS | Status: DC | PRN
Start: 2016-07-06 — End: 2016-07-07

## 2016-07-06 MED ORDER — MIRTAZAPINE 30 MG PO TABS
30 mg | Freq: Every evening | ORAL | Status: DC | PRN
Start: 2016-07-06 — End: 2016-07-11

## 2016-07-06 MED ORDER — ATORVASTATIN CALCIUM 40 MG PO TABS
40 mg | Freq: Every evening | ORAL | Status: DC
Start: 2016-07-06 — End: 2016-07-11

## 2016-07-06 MED ORDER — GLUCOSE 4 G PO CHEW JX
16 g | ORAL | Status: DC | PRN
Start: 2016-07-06 — End: 2016-07-07

## 2016-07-06 MED ORDER — CHLORTHALIDONE 25 MG PO TABS
Freq: Every day | ORAL
Start: 2016-07-06 — End: ?

## 2016-07-06 NOTE — ED Notes
55 yo AAM arrived to air room c/o bilateral leg swelling x 4 days. Pt states leg pain and swelling have gotten worse. Pt endorses SOB and numbness and tingling to lower extremities. Per JFRD, pt was to f/u with cardiology but has not. Pt a/o x 4, respirations are even and unlabored, and in NAD. Pt to ECC for eval.

## 2016-07-06 NOTE — ED Notes
55 yo AAM arrived to air room c/o bilateral leg swelling x 4 days. Pt states leg pain and swelling have gotten worse. Pt endorses sob and numbness and tingling to lower extremities. Per JFRD, pt was to f/u with cardiology but has not. Pt a/o x 4, respirations are even and unlabored, and in NAD.

## 2016-07-07 MED ORDER — MAGNESIUM CITRATE 1.745 GM/30ML PO SOLN
296 mL | Freq: Once | ORAL | Status: CP
Start: 2016-07-07 — End: ?

## 2016-07-07 MED ORDER — HYDROCODONE-ACETAMINOPHEN 5-325 MG PO TABS
1 | ORAL_TABLET | Freq: Four times a day (QID) | ORAL | Status: DC | PRN
Start: 2016-07-07 — End: 2016-07-11

## 2016-07-07 NOTE — Progress Notes
Pt  Refused assessment, waiting on transport to new room

## 2016-07-07 NOTE — H&P
1. FEN/GI: diabetic diet. Monitor electrolytes and replace as needed.  2. Prophylaxis: heparin gtt  3. Code status: Full  4. Dispo: Admit to IM Team C    Terance HartP. P. Patel, M.D.  Internal Medicine, PGY-3  Pager: [phone]  07/06/2016 8:47 PM  [facility]

## 2016-07-07 NOTE — ED Provider Notes
This suggestion is concordant with my clinical acumen.   Ernest JubileeJames Gabriel, MD 6:10 PM 07/06/2016  ----------------------------------------------------------------------------------------------------------------------      ED Course & Re-Evaluation     ED Course     Bedside ultrasound equivocals, poor flow, turbulence, proximal left common fem/junction. Pain in popliteal b/l, with b/l pop poor compressibility conerning for presence of DVT. Formal ordered, though insetting of bilateral edema, will additionally consider CHF as cause, bedside evaluation of heart with good squeeze, no effusion, no RHS, Pro-BNP ordered, trop ordered, ekg wnl.  Ernest JubileeJames Gabriel, MD 7:07 PM 07/06/2016'    Creatinine elevated, will order heparin over lovenox in this setting. Not hypotensive, bedside ultrasound and ekg and trop without evidence of significant heart strain, will cancel CTA in this setting, and save patient dose of contrast in setting of CKD. Ernest JubileeJames Gabriel, MD 7:38 PM 07/06/2016        ED Disposition   ED Disposition: Admit      ED Clinical Impression   ED Clinical Impression:   SOB (shortness of breath)  Leg swelling  Pitting edema      ED Patient Status   Patient Status:   Fair        ED Medical Evaluation Initiated   Medical Evaluation Initiated:   Yes, filed at 07/06/16 1747  by Ernest JubileeGabriel, James, MD

## 2016-07-07 NOTE — Progress Notes
md aware of pt not having BM in last 5 days, and pain 8/10 in back and knees. Awaiting orders. md to bedside.

## 2016-07-07 NOTE — ED Provider Notes
EKG (Read by ED Provider):  Not consistent with ischemic/or high risk findings. (No new: STE, STD 0.5 mm+, TWI 251mm+, contiguous q waves x 2, New LBBB) Ella JubileeJames Gabriel, MD 7:06 PM 07/06/2016      MDM  Number of Diagnoses or Management Options     Amount and/or Complexity of Data Reviewed  Clinical lab tests: ordered and reviewed  Tests in the radiology section of CPT?: ordered and reviewed  Review and summarize past medical records: yes    ----------------------------------------------------------------------------------------------------------------------    Pretest probability of deep vein thrombosis (Wells score) & Diagnostic Approach    1. Compression Ultrasonography     2. Assess Pretest Probability    Clinical feature:      Score: (Add +1 per every yes)  no Active cancer (treatment ongoing or within the previous six months or palliative)   no Paralysis, paresis, or recent plaster immobilization of the lower extremities    no Recently bedridden for more than three days or major surgery, within four weeks   yes Localized tenderness along the distribution of the deep venous system   yes Entire leg swollen   no Calf swelling by more than 3 cm when compared to the asymptomatic leg (measured below tibial tuberosity)   yes Pitting edema (greater in the symptomatic leg)   no Collateral superficial veins (nonvaricose)     Additionally:   no Alternative diagnosis as likely or more likely than that of deep venous thrombosis (-2 from score)    Score Total:  High probability  3 or greater  Moderate probability  1 or 2  Low probability  0 or less    Modification:  This clinical model has been modified to take one other clinical feature into account: a previously documented deep vein thrombosis (DVT) is given the score of 1. Using this modified scoring system, DVT is either likely or unlikely, as follows:  DVT likely 2 or greater   DVT unlikely 1 or less    3. Interpret results with probability

## 2016-07-07 NOTE — ED Provider Notes
no Alternative diagnosis as likely or more likely than that of deep venous thrombosis (-2 from score)    Score Total:  High probability  3 or greater  Moderate probability  1 or 2  Low probability  0 or less    Modification:  This clinical model has been modified to take one other clinical feature into account: a previously documented deep vein thrombosis (DVT) is given the score of 1. Using this modified scoring system, DVT is either likely or unlikely, as follows:  DVT likely 2 or greater   DVT unlikely 1 or less    3. Interpret results with probability  High Probability   (If Normal U/S, Confirm with Venography)  Moderate Probability  (If Normal U/S f/u U/S in one week)  Low Probability   (If Abnormal U/S, Confirm with Venography)    Comment: Formal ordered.   This suggestion is concordant with my clinical acumen.   Ella JubileeJames Gabriel, MD 6:10 PM 07/06/2016  ----------------------------------------------------------------------------------------------------------------------    MDM    ED Course & Re-Evaluation     ED Course         ED Disposition   ED Disposition: No ED Disposition Set      ED Clinical Impression   ED Clinical Impression:   No Clinical Impression Set      ED Patient Status   Patient Status:   {SH ED JX PATIENT STATUS:[id number]}        ED Medical Evaluation Initiated   Medical Evaluation Initiated:   Yes, filed at 07/06/16 1747  by Ella JubileeGabriel, James, MD

## 2016-07-07 NOTE — ED Provider Notes
ED Ultrasound Performed  Date/Time: 07/06/2016 6:43 PM  Performed by: Ella JubileeGABRIEL, JAMES  Authorized by: Ella JubileeGABRIEL, JAMES   The ultrasound procedure performed was deep vein thrombosis ultrasound.   Reasons for the procedure performed included leg pain and leg swelling.   Evaluated for DVT: right leg evaluated for deep vein thrombosis and left leg evaluated for deep vein thrombosis.  All vessels compressible? no.  Lack of compressibility? yes.  Lack of compressibility location: common femoral and junction of deep and superficial veins.  Clot visible: equivical.  Patient position: supine.  Documentation: images saved on hard disk and still images obtained.  Patient tolerance: Patient tolerated the procedure well with no immediate complications    Patient tolerance: Patient tolerated the procedure well with no immediate complications.          Labs:  - - No data to display      Imaging (Read by ED Provider):  ***     EKG (Read by ED Provider):  {EKG findings:[id number]}       ----------------------------------------------------------------------------------------------------------------------    Pretest probability of deep vein thrombosis (Wells score) & Diagnostic Approach    1. Compression Ultrasonography     2. Assess Pretest Probability    Clinical feature:      Score: (Add +1 per every yes)  no Active cancer (treatment ongoing or within the previous six months or palliative)   no Paralysis, paresis, or recent plaster immobilization of the lower extremities    no Recently bedridden for more than three days or major surgery, within four weeks   yes Localized tenderness along the distribution of the deep venous system   yes Entire leg swollen   no Calf swelling by more than 3 cm when compared to the asymptomatic leg (measured below tibial tuberosity)   yes Pitting edema (greater in the symptomatic leg)   no Collateral superficial veins (nonvaricose)     Additionally:

## 2016-07-07 NOTE — ED Provider Notes
This clinical model has been modified to take one other clinical feature into account: a previously documented deep vein thrombosis (DVT) is given the score of 1. Using this modified scoring system, DVT is either likely or unlikely, as follows:  DVT likely 2 or greater   DVT unlikely 1 or less    3. Interpret results with probability  High Probability   (If Normal U/S, Confirm with Venography)  Moderate Probability  (If Normal U/S f/u U/S in one week)  Low Probability   (If Abnormal U/S, Confirm with Venography)    Comment: Formal ordered.   This suggestion is concordant with my clinical acumen.   Ella JubileeJames Gabriel, MD 6:10 PM 07/06/2016  ----------------------------------------------------------------------------------------------------------------------      ED Course & Re-Evaluation     ED Course     Bedside ultrasound equivocals, poor flow, turbulence, proximal left common fem/junction. Pain in popliteal b/l, with b/l pop poor compressibility conerning for presence of DVT. Formal ordered, though insetting of bilateral edema, will additionally consider CHF as cause, bedside evaluation of heart with good squeeze, no effusion, no RHS, Pro-BNP ordered, trop ordered, ekg wnl.  Ella JubileeJames Gabriel, MD 7:07 PM 07/06/2016'    Creatinine elevated, will order heparin over lovenox in this setting. Not hypotensive, bedside ultrasound and ekg and trop without evidence of significant heart strain, will cancel CTA in this setting, and save patient dose of contrast in setting of CKD. Ella JubileeJames Gabriel, MD 7:38 PM 07/06/2016        ED Disposition   ED Disposition: Admit      ED Clinical Impression   ED Clinical Impression:   SOB (shortness of breath)  Leg swelling  Pitting edema      ED Patient Status   Patient Status:   Fair        ED Medical Evaluation Initiated   Medical Evaluation Initiated:   Yes, filed at 07/06/16 1747  by Ella JubileeGabriel, James, MD             Ella JubileeGabriel, James, MD  Resident  07/06/16 2017

## 2016-07-07 NOTE — ED Notes
Report given to Jelly R.N. In CCU, pt going to room 503.

## 2016-07-07 NOTE — Plan of Care
Problem: Risk for Injury R/T Falls  Goal: Prevent falls  Outcome: Ongoing  Intervention: Educate patient & family on fall precautions  Reviewed falls protocol with patient and he signed the refusal for bed alarm and falls protocol; continue to monitor as much as possible.

## 2016-07-07 NOTE — ED Provider Notes
Temp src 07/06/16 1721 Oral   Height --    Weight --    SpO2 07/06/16 1721 100 %   BMI (Calculated) --              Physical Exam    Differential DDx: ***    Is this an Emergent Medical Condition? Yes - Severe Pain/Acute Onset of Symptons  409.901 FS  641.19 FS  627.732 (16) FS    ED Workup   Procedures    Labs:  - - No data to display      Imaging (Read by ED Provider):  {Imaging findings:[phone]}    EKG (Read by ED Provider):  {EKG findings:[phone]}    MDM    ED Course & Re-Evaluation     ED Course         ED Disposition   ED Disposition: No ED Disposition Set      ED Clinical Impression   ED Clinical Impression:   No Clinical Impression Set      ED Patient Status   Patient Status:   {[facility]:[phone]}        ED Medical Evaluation Initiated   Medical Evaluation Initiated:   Yes, filed at 07/06/16 1747  by Ella JubileeGabriel, James, MD

## 2016-07-07 NOTE — Plan of Care
Problem: Glycemic Control  Goal: Glucose levels within prescribed parameters  Outcome: Ongoing  Intervention: Treat hypo/hyperglycemia as ordered  Patient's bedtime accucheck is 132; did not require sliding scale coverage; patient is eating salad and sandwich; continue to monitor.

## 2016-07-07 NOTE — Plan of Care
Problem: Risk for Injury R/T Falls  Goal: Prevent falls  Outcome: Ongoing  Pt educated on call light use and need to call for help to prevent falls

## 2016-07-07 NOTE — Progress Notes
Resident of internal med c team paged to notify that pt c/o no bm for past 5 days and due to c/o pain unrelieved by tylenol

## 2016-07-07 NOTE — Medical Student
Lymph nodes: cervical, supraclavicular, and axillary nodes non-palpable  Neuro: Mental status:  Alert, oriented, thought content appropriate  Cranial nerves: cranial nerves II-XII normal    Data Review:   CBC (with or without Differential):   Lab Results   Component Value Date    WBC 5.24 07/07/2016    HGB 10.3 (L) 07/07/2016    HCT 31.7 (L) 07/07/2016    MCV 82.1 07/07/2016    MCH 26.7 (L) 07/07/2016    MCHC 32.5 07/07/2016    RDW 14.8 07/07/2016    PLATCOUNT 195 07/07/2016    MPV 9.9 07/07/2016    NEUTROPCT 54.0 07/07/2016    MONOPCT 7.8 (H) 07/07/2016    EOSPCT 3.8 07/07/2016    BASOPCT 0.0 07/07/2016   , Retic Count: No results found for: RETICMANUN, RETICHGB, RETICMANCOR, RETICAUTCOR, RETICAUTOUN, BMP/CMP:   Lab Results   Component Value Date    NA 144 07/07/2016    K 3.6 07/07/2016    CL 104 07/07/2016    CO2 24 07/07/2016    BUN 36 (H) 07/07/2016    CREATININE 1.77 (H) 07/07/2016    GLU 128 (H) 07/07/2016    CALCIUM 8.6 07/07/2016    TPROT 6.3 (L) 07/06/2016    ALB 3.5 (L) 07/06/2016    AST 18 07/06/2016    ALT 15 07/06/2016    EGFR 48 07/07/2016    TBILI 0.2 07/06/2016    ALKPHOS 59 07/06/2016    MG 1.9 07/07/2016   , Creatinine Clearance: No results found for: CREACLEAR and BUN:   Lab Results   Component Value Date/Time    BUN 36 (H) 07/07/2016 02:47 AM         Assessment and Plan:   Pt is a 68yoM with a PMH significant for HTN, DM, anxiety, depression and PTSD who presented to the ED complaining of right leg pain and swelling, worsening for the past 48 hrs. Admitted to medicine for DVT.  ?  Provoked DVT  - in setting of recent prolonged travel  - LE [place] with evidence of DVT in right femoral vein and left popliteal vein  - f/u hypercoagulable workup  - Wells Score 4.5, but low clinical suspicion for PE  - EKG with NSR  - f/u limited TTE to eval for R heart strain. Cardiac biomarkers neg. Hemodynamically stable. Does not current require thrombolytic rx.  - c/w heparin gtt for now; will bridge to eliquis
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
## 2016-07-07 NOTE — ED Provider Notes
?   BACK SURGERY     ? KNEE SURGERY         No family history on file.    Social History     Social History   ? Marital status: Single     Spouse name: N/A   ? Number of children: N/A   ? Years of education: N/A     Social History Main Topics   ? Smoking status: Never Smoker   ? Smokeless tobacco: Never Used   ? Alcohol use No   ? Drug use: None   ? Sexual activity: Not Asked     Other Topics Concern   ? None     Social History Narrative   ? None       Review of Systems   Constitutional: Negative.    HENT: Negative.    Eyes: Negative.    Respiratory: Positive for shortness of breath.    Cardiovascular: Positive for leg swelling. Negative for palpitations.   Gastrointestinal: Negative.    Genitourinary: Negative.    Musculoskeletal: Negative.    Skin: Negative.    Neurological: Negative.    Psychiatric/Behavioral: Negative.    Allergic/Immunologic: negative.    Endocrine: negative.       Physical Exam       ED Triage Vitals   BP 07/06/16 1721 111/73   Pulse 07/06/16 1721 80   Resp 07/06/16 1721 18   Temp 07/06/16 1721 37.2 ?C (98.9 ?F)   Temp src 07/06/16 1721 Oral   Height --    Weight --    SpO2 07/06/16 1721 100 %   BMI (Calculated) --              Physical Exam   Constitutional: He appears well-developed.   HENT:   Head: Normocephalic.   Nose: No rhinorrhea. No epistaxis.   Eyes: Pupils are equal, round, and reactive to light. No scleral icterus.   Neck: No tracheal deviation present.   Cardiovascular: Regular rhythm and normal heart sounds.    No murmur heard.  Pulmonary/Chest: Effort normal. No stridor. He has no rales.   Abdominal: He exhibits no distension and no mass. There is no guarding.   Musculoskeletal: Normal range of motion.   Skin: Skin is warm.   Nursing note and vitals reviewed.      Differential DDx: CHF, Liver Disease, kidney disease, DVT, other    Is this an Emergent Medical Condition? Yes - Severe Pain/Acute Onset of Symptons  409.901 FS  641.19 FS  627.732 (16) FS    ED Workup

## 2016-07-07 NOTE — ED Provider Notes
hypotensive, bedside ultrasound and ekg and trop without evidence of significant heart strain, will cancel CTA in this setting, and save patient dose of contrast in setting of CKD. Ella JubileeJames Gabriel, MD 7:38 PM 07/06/2016        ED Disposition   ED Disposition: Admit      ED Clinical Impression   ED Clinical Impression:   SOB (shortness of breath)  Leg swelling  Pitting edema      ED Patient Status   Patient Status:   {[phone]}        ED Medical Evaluation Initiated   Medical Evaluation Initiated:   Yes, filed at 07/06/16 1747  by Ella JubileeGabriel, James, MD

## 2016-07-07 NOTE — Medical Student
?   cyanocobalamin (vitamin B12) 1000 MCG/ML Solution into the muscle.   ? DULoxetine (CYMBALTA) 60 MG Capsule Delayed Release Particles Take by mouth daily.   ? gabapentin (NEURONTIN) 600 MG Tablet Take by mouth 3 times daily.   ? ketoconazole (NIZORAL) 2 % Cream Apply topically daily.   ? metFORMIN (GLUCOPHAGE) 1000 MG Tablet Take by mouth 2 times daily (with meals). SrCr:   mg/dl   [last value]   ? mirtazapine (REMERON) 30 MG Tablet Take by mouth nightly at bedtime.   ? simvastatin (ZOCOR) 40 MG Tablet by mouth.   ? tamsulosin (FLOMAX) 0.4 MG Capsule Take by mouth daily.     No Known Allergies     Review of Systems:  All pertinent ROS addressed in HPI.     Physical Exam:     Patient Vitals for the past 8 hrs:   BP Temp Temp src Pulse Resp SpO2 Weight   07/07/16 0600 132/73 - - 66 12 95 % -   07/07/16 0538 - - - - - - 94.2 kg (207 lb 10.8 oz)   07/07/16 0400 141/72 36.5 ?C (97.7 ?F) Oral 74 15 99 % -   07/07/16 0200 121/62 - - 69 13 94 % -       Patient Vitals for the past 72 hrs:   Weight Urine   07/07/16 0700 - 350 mL   07/07/16 0538 94.2 kg (207 lb 10.8 oz) -   07/07/16 0400 - 400 mL   07/06/16 2350 - 50 mL   07/06/16 2202 94.1 kg (207 lb 7.3 oz) -   07/06/16 1932 93 kg (205 lb) -       Body mass index is 28.96 kg/(m^2).   SpO2: 95 % (10/09 0600)        Respiratory Device: Room Air / (None)   Pain Score  Pain Rating (J[facility] Only) : 9  Score: FLACC : 0    General: alert, cooperative, mildly obese  HEENT: Normocepahlic and atraumatic  Neck: supple, symmetrical, trachea midline, no adenopathy and no carotid bruit  Chest: Lungs:clear to auscultation bilaterally  Cardiac: regular rate and rhythm, S1, S2 normal, no murmur, click, rub or gallop  Abdomen: soft, non-tender; bowel sounds normal; no masses,  no organomegaly  Extremities: no edema, redness or tenderness in the calves or thighs  Skin: Skin color, texture, turgor normal. No rashes or lesions

## 2016-07-07 NOTE — H&P
Atrial Rate 72 BPM    P-R Interval 150 ms    QRS Duration 78 ms    Q-T Interval 422 ms    QTC Calculation (Bezet) 462 ms    Calculated P Axis 54 degrees    Calculated R Axis 15 degrees    Calculated T Axis 55 degrees   POCT TROPININ I    Collection Time: 07/06/16  6:53 PM   Result Value Ref Range    Troponin I (Point of Care) <0.05 0.00 - 0.23 ng/mL   D-Dimer,Quantitative    Collection Time: 07/06/16  7:14 PM   Result Value Ref Range    D Dimer (hs) 1.18 (H) 0.00 - 0.49 ug/mL (FEU)   PROTIME-INR    Collection Time: 07/06/16  7:14 PM   Result Value Ref Range    Protime 12.2 11.9 - 14.3 seconds    INR 0.9 0.9 - 1.1   APTT    Collection Time: 07/06/16  7:14 PM   Result Value Ref Range    PTT 35 24 - 35 seconds   CBC prior to starting Heparin    Collection Time: 07/06/16  8:12 PM   Result Value Ref Range    WBC 5.36 4.5 - 11 x10E3/uL    RBC 4.10 (L) 4.50 - 6.30 x10E6/uL    Hemoglobin 11.1 (L) 14.0 - 18.0 g/dL    Hematocrit 16.133.5 (L) 40.0 - 54.0 %    MCV 81.7 (L) 82.0 - 101.0 fl    MCH 27.1 27.0 - 34.0 pg    MCHC 33.1 31.0 - 36.0 g/dL    RDW 09.614.8 04.512.0 - 40.916.1 %    Platelet Count 216 140 - 440 thou/cu mm    MPV 9.8 9.5 - 11.5 fl    nRBC % 0.0 0.0 - 1.0 %    Absolute NRBC Count 0.00          Assessment and Plan:   Ernest Price is a 55 y.o. male with PMH significant for HTN, DM who presented with leg pain. Admitted to medicine for DVT.    Provoked DVT  -in setting of recent prolonged travel  -LE [place] with evidence of b/l DVT  -will send for hypercoagulable workup  -limited TTE to eval for R heart strain. Cardiac biomarkers neg. Hemodynamically stable. Does not current require thrombolytic rx.  -treat with heparin gtt for now  -will need to transiton to PO anticoagulant depending on financial affordability    AKI on CKD vs CKD  -Cr. 2.11. Unclear base line  -monitor strict I/os  -urine lytes ordered    HTN  -c/w home medications    DM  -a1c ordered  -ISS

## 2016-07-07 NOTE — Progress Notes
Pt up to bathroom without assistance, pt educated to call for help when ambulating to bathroom but pt refused, pt unhooked self from heart monitor to ambulate to bathroom, pt refuses to use bedside commode, pt refuses to use urinals at this time, pt off heart monitor per self removal. Pt refuses to stay on heart monitor. Rn encouraged pt to call out when done in bathroom to be hooked back up to monitors, pt verbalized understanding.
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
## 2016-07-07 NOTE — Consults
Ernest Price will be coming this morning for communion.

## 2016-07-07 NOTE — ED Provider Notes
High Probability   (If Normal U/S, Confirm with Venography)  Moderate Probability  (If Normal U/S f/u U/S in one week)  Low Probability   (If Abnormal U/S, Confirm with Venography)    Comment: Formal ordered.   This suggestion is concordant with my clinical acumen.   Ella JubileeJames Gabriel, MD 6:10 PM 07/06/2016  ----------------------------------------------------------------------------------------------------------------------        MDM    ED Course & Re-Evaluation     ED Course         ED Disposition   ED Disposition: No ED Disposition Set      ED Clinical Impression   ED Clinical Impression:   No Clinical Impression Set      ED Patient Status   Patient Status:   {SH ED JX PATIENT STATUS:[phone]}        ED Medical Evaluation Initiated   Medical Evaluation Initiated:   Yes, filed at 07/06/16 1747  by Ella JubileeGabriel, James, MD

## 2016-07-07 NOTE — Plan of Care
Problem: Altered Cardiac Function  Goal: BP, HR, heart rhythm WNL for patient  Outcome: Ongoing  Patient admitted and is on telemetry and is sinus rhythm with stable vitals; continue to monitor.
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
## 2016-07-07 NOTE — ED Provider Notes
Temp src 07/06/16 1721 Oral   Height --    Weight --    SpO2 07/06/16 1721 100 %   BMI (Calculated) --              Physical Exam    Differential DDx: ***    Is this an Emergent Medical Condition? Yes - Severe Pain/Acute Onset of Symptons  409.901 FS  641.19 FS  627.732 (16) FS    ED Workup   Procedures    Labs:  - - No data to display      Imaging (Read by ED Provider):  {Imaging findings:[phone]}    EKG (Read by ED Provider):  {EKG findings:[phone]}       ----------------------------------------------------------------------------------------------------------------------    Pretest probability of deep vein thrombosis (Wells score) & Diagnostic Approach    1. Compression Ultrasonography     2. Assess Pretest Probability    Clinical feature:      Score: (Add +1 per every yes)  no Active cancer (treatment ongoing or within the previous six months or palliative)   no Paralysis, paresis, or recent plaster immobilization of the lower extremities    no Recently bedridden for more than three days or major surgery, within four weeks   yes Localized tenderness along the distribution of the deep venous system   yes Entire leg swollen   no Calf swelling by more than 3 cm when compared to the asymptomatic leg (measured below tibial tuberosity)   yes Pitting edema (greater in the symptomatic leg)   no Collateral superficial veins (nonvaricose)     Additionally:   no Alternative diagnosis as likely or more likely than that of deep venous thrombosis (-2 from score)    Score Total:  High probability  3 or greater  Moderate probability  1 or 2  Low probability  0 or less    Modification:  This clinical model has been modified to take one other clinical feature into account: a previously documented deep vein thrombosis (DVT) is given the score of 1. Using this modified scoring system, DVT is either likely or unlikely, as follows:  DVT likely 2 or greater   DVT unlikely 1 or less    3. Interpret results with probability

## 2016-07-07 NOTE — Consults
Admitted to IM team C. See full H&P.    Gae BonPUJAN PATEL, MD  07/06/2016  10:54 PM

## 2016-07-07 NOTE — Progress Notes
Pt left floor with transport and belongings

## 2016-07-07 NOTE — ED Provider Notes
?   BACK SURGERY     ? KNEE SURGERY         No family history on file.    Social History     Social History   ? Marital status: Single     Spouse name: N/A   ? Number of children: N/A   ? Years of education: N/A     Social History Main Topics   ? Smoking status: Never Smoker   ? Smokeless tobacco: Never Used   ? Alcohol use No   ? Drug use: None   ? Sexual activity: Not Asked     Other Topics Concern   ? None     Social History Narrative   ? None       Review of Systems   Constitutional: Negative.    HENT: Negative.    Eyes: Negative.    Respiratory: Positive for shortness of breath.    Cardiovascular: Positive for leg swelling. Negative for palpitations.   Gastrointestinal: Negative.    Genitourinary: Negative.    Musculoskeletal: Negative.    Skin: Negative.    Neurological: Negative.    Psychiatric/Behavioral: Negative.    Allergic/Immunologic: negative.    Endocrine: negative.       Physical Exam       ED Triage Vitals   BP 07/06/16 1721 111/73   Pulse 07/06/16 1721 80   Resp 07/06/16 1721 18   Temp 07/06/16 1721 37.2 ?C (98.9 ?F)   Temp src 07/06/16 1721 Oral   Height --    Weight 07/06/16 1932 93 kg   SpO2 07/06/16 1721 100 %   BMI (Calculated) --              Physical Exam   Constitutional: He appears well-developed.   HENT:   Head: Normocephalic.   Nose: No rhinorrhea. No epistaxis.   Eyes: Pupils are equal, round, and reactive to light. No scleral icterus.   Neck: No tracheal deviation present.   Cardiovascular: Regular rhythm and normal heart sounds.    No murmur heard.  Pulmonary/Chest: Effort normal. No stridor. He has no rales.   Abdominal: He exhibits no distension and no mass. There is no guarding.   Musculoskeletal: Normal range of motion.   Skin: Skin is warm.   Nursing note and vitals reviewed.      Differential DDx: CHF, Liver Disease, kidney disease, DVT, other    Is this an Emergent Medical Condition? Yes - Severe Pain/Acute Onset of Symptons  409.901 FS  641.19 FS  627.732 (16) FS    ED Workup

## 2016-07-07 NOTE — H&P
mouth daily.   Yes Information, Historical   gabapentin (NEURONTIN) 600 MG Tablet Take by mouth 3 times daily.   Yes Information, Historical   ketoconazole (NIZORAL) 2 % Cream Apply topically daily.   Yes Information, Historical   metFORMIN (GLUCOPHAGE) 1000 MG Tablet Take by mouth 2 times daily (with meals). SrCr:   mg/dl   [last value]   Yes Information, Historical   mirtazapine (REMERON) 30 MG Tablet Take by mouth nightly at bedtime.   Yes Information, Historical   simvastatin (ZOCOR) 40 MG Tablet by mouth.   Yes Information, Historical   tamsulosin (FLOMAX) 0.4 MG Capsule Take by mouth daily.   Yes Information, Historical     Review of Systems   All other systems reviewed and are negative.       Objective:     Physical Exam:  Vitals:    07/06/16 1721 07/06/16 1910 07/06/16 1932   BP: 111/73 132/76    Pulse: 80 68    Resp: 18 18    Temp: 37.2 ?C (98.9 ?F)     TempSrc: Oral     SpO2: 100% 98%    Weight:   93 kg (205 lb)     SpO2: 98 % (10/08 1910)        Pain Rating ([facility] Only) : 9  There is no height or weight on file to calculate BMI.   No intake or output data in the 24 hours ending 07/06/16 2047    Physical Exam   Constitutional: He appears well-developed.   HENT:   Head: Normocephalic and atraumatic.   Eyes: Pupils are equal, round, and reactive to light.   Neck: Normal range of motion. No JVD present.   Cardiovascular: Normal rate, regular rhythm and normal heart sounds.  Exam reveals no friction rub.    No murmur heard.  Pulmonary/Chest: Effort normal and breath sounds normal. No respiratory distress. He has no wheezes.   Abdominal: Soft. Bowel sounds are normal. He exhibits no distension.   Musculoskeletal: Normal range of motion. He exhibits edema and tenderness.   Neurological: He is alert.   Skin: Skin is warm. He is not diaphoretic. No erythema.           Data Review:   Recent Results (from the past 24 hour(s))   Basic Metabolic Panel    Collection Time: 07/06/16  6:37 PM   Result Value Ref Range

## 2016-07-07 NOTE — ED Provider Notes
History     Chief Complaint   Patient presents with   ? Leg Swelling     Ernest Price is a 55 y.o. male; past medical history: HTN, DM. Presents with complaints of leg swelling.     HPI    No Known Allergies    Patient's Medications   New Prescriptions    No medications on file   Previous Medications    CHLORTHALIDONE (HYGROTON) 25 MG TABLET    Take by mouth daily.    CYANOCOBALAMIN (VITAMIN B12) 1000 MCG/ML SOLUTION    into the muscle.    DULOXETINE (CYMBALTA) 60 MG CAPSULE DELAYED RELEASE PARTICLES    Take by mouth daily.    GABAPENTIN (NEURONTIN) 600 MG TABLET    Take by mouth 3 times daily.    KETOCONAZOLE (NIZORAL) 2 % CREAM    Apply topically daily.    METFORMIN (GLUCOPHAGE) 1000 MG TABLET    Take by mouth 2 times daily (with meals). SrCr:   mg/dl   [last value]    MIRTAZAPINE (REMERON) 30 MG TABLET    Take by mouth nightly at bedtime.    SIMVASTATIN (ZOCOR) 40 MG TABLET    by mouth.    TAMSULOSIN (FLOMAX) 0.4 MG CAPSULE    Take by mouth daily.   Modified Medications    No medications on file   Discontinued Medications    No medications on file       Past Medical History:   Diagnosis Date   ? Anxiety    ? Depressed    ? Diabetes mellitus    ? Drug abuse    ? Hypertension    ? PTSD (post-traumatic stress disorder)        Past Surgical History:   Procedure Laterality Date   ? BACK SURGERY     ? KNEE SURGERY         No family history on file.    Social History     Social History   ? Marital status: N/A     Spouse name: N/A   ? Number of children: N/A   ? Years of education: N/A     Social History Main Topics   ? Smoking status: Never Smoker   ? Smokeless tobacco: Never Used   ? Alcohol use No   ? Drug use: None   ? Sexual activity: Not Asked     Other Topics Concern   ? None     Social History Narrative   ? None       Review of Systems    Physical Exam     ED Triage Vitals   BP 07/06/16 1721 111/73   Pulse 07/06/16 1721 80   Resp 07/06/16 1721 18   Temp 07/06/16 1721 37.2 ?C (98.9 ?F)

## 2016-07-07 NOTE — Progress Notes
Pt appears to be very irritated about dietary provided to pt. Pt c/o of "not having enough hot tea," when pt had cup currently in front of him, and RN offered him another cup. RN got patient another cup of tea. Pt also requested to have thermostat filled and rn explained to pt the importance of strict I's and O's on the cardiology floor, and pt stated "that's ridiculous." Pt also stated "why can't I have 2% milk like I requested. Why is that so hard to do." when explained to pt that he is on a cardiac diabetic diet, and that the best option for milk for him would be skim milk and we only have skim milk available. Pt became angry and stated "thats not what I asked for." pt declined skim milk. Pt asked to speak with someone in charge of his dietary needs, and that he "needs to be accommodated." pt declined anything Rn has offered to get for him and pt requested to speak with charge RN. Charge RN aware.
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
## 2016-07-07 NOTE — ED Provider Notes
ED Ultrasound Performed  Date/Time: 07/06/2016 6:43 PM  Performed by: Ella JubileeGABRIEL, JAMES  Authorized by: Ella JubileeGABRIEL, JAMES   The ultrasound procedure performed was deep vein thrombosis ultrasound.   Reasons for the procedure performed included leg pain and leg swelling.   Evaluated for DVT: right leg evaluated for deep vein thrombosis and left leg evaluated for deep vein thrombosis.  All vessels compressible? no.  Lack of compressibility? yes.  Lack of compressibility location: common femoral and junction of deep and superficial veins.  Clot visible: equivical.  Patient position: supine.  Documentation: images saved on hard disk and still images obtained.  Patient tolerance: Patient tolerated the procedure well with no immediate complications    Patient tolerance: Patient tolerated the procedure well with no immediate complications.          Labs:  -   CBC AUTODIFF - Abnormal        Result Value Ref Range    RBC 4.06 (*) 4.50 - 6.30 x10E6/uL    Hemoglobin 11.0 (*) 14.0 - 18.0 g/dL    Hematocrit 16.133.0 (*) 40.0 - 54.0 %    MCV 81.3 (*) 82.0 - 101.0 fl    Monocytes % 9.5 (*) 2.0 - 6.0 %    WBC 5.27  4.5 - 11 x10E3/uL    MCH 27.1  27.0 - 34.0 pg    MCHC 33.3  31.0 - 36.0 g/dL    RDW 09.614.8  04.512.0 - 40.916.1 %    Platelet Count 204  140 - 440 thou/cu mm    MPV 9.5  9.5 - 11.5 fl    nRBC % 0.0  0.0 - 1.0 %    Absolute NRBC Count 0.00      Neutrophils % 56.9  34.0 - 73.0 %    Lymphocytes % 29.6  25.0 - 45.0 %    Eosinophils % 3.4  1.0 - 4.0 %    Immature Granulocytes % 0.4  0.0 - 2.0 %    Neutrophils Absolute 3.00  1.80 - 8.70 x10E3/uL    Lymphocytes Absolute 1.56  x10E3/uL    Monocytes Absolute 0.50  x10E3/uL    Eosinophils Absolute 0.18  x10E3/uL    Basophil Absolute 0.01  x10E3/uL    Basophils % 0.2  0 - 1 %   POCT TROPININ I - Normal    Troponin I (Point of Care) <0.05  0.00 - 0.23 ng/mL   BASIC METABOLIC PANEL   BRAIN NATRIURETIC PEPTIDE   POCT TROPININ I   CBC AND DIFFERENTIAL         Imaging (Read by ED Provider):  ***

## 2016-07-07 NOTE — Progress Notes
At pt's request, I called Zorita PangPriest for [facility].  Waiting for call back.

## 2016-07-07 NOTE — ED Provider Notes
Impression: Thrombus within the right femoral vein and left popliteal vein. [name] was notified of the above findings by telephone on 07/06/2016 at 7:16 PM.  This individual acknowledged receipt of the information and repeated it back.     Per Radiology:  Xr Chest Single View  Result Date: 07/06/2016  No acute cardiopulmonary process.        EKG (Read by ED Provider):  Not consistent with ischemic/or high risk findings. (No new: STE, STD 0.5 mm+, TWI 471mm+, contiguous q waves x 2, New LBBB) Ernest JubileeJames Gabriel, MD 7:06 PM 07/06/2016      MDM  Number of Diagnoses or Management Options     Amount and/or Complexity of Data Reviewed  Clinical lab tests: ordered and reviewed  Tests in the radiology section of CPT?: ordered and reviewed  Review and summarize past medical records: yes    ----------------------------------------------------------------------------------------------------------------------    Pretest probability of deep vein thrombosis (Wells score) & Diagnostic Approach    1. Compression Ultrasonography     2. Assess Pretest Probability    Clinical feature:      Score: (Add +1 per every yes)  no Active cancer (treatment ongoing or within the previous six months or palliative)   no Paralysis, paresis, or recent plaster immobilization of the lower extremities    no Recently bedridden for more than three days or major surgery, within four weeks   yes Localized tenderness along the distribution of the deep venous system   yes Entire leg swollen   no Calf swelling by more than 3 cm when compared to the asymptomatic leg (measured below tibial tuberosity)   yes Pitting edema (greater in the symptomatic leg)   no Collateral superficial veins (nonvaricose)     Additionally:   no Alternative diagnosis as likely or more likely than that of deep venous thrombosis (-2 from score)    Score Total:  High probability  3 or greater  Moderate probability  1 or 2  Low probability  0 or less    Modification:

## 2016-07-07 NOTE — Plan of Care
Problem: Risk for Bleeding  Goal: Prevent Bleeding  Outcome: Ongoing  Intervention: Monitor medication specific coagulation parameters  The first heparin level was 0.93, high, adjusted heparin gtt off for 1 hour and resume at 4 u/kg/hr less; next heparin level would be due at 10:50 a.m. 07/07/16; continue to monitor.

## 2016-07-07 NOTE — Progress Notes
Pt refused assessment, waiting on transport for new room

## 2016-07-07 NOTE — ED Provider Notes
no Recently bedridden for more than three days or major surgery, within four weeks   yes Localized tenderness along the distribution of the deep venous system   yes Entire leg swollen   no Calf swelling by more than 3 cm when compared to the asymptomatic leg (measured below tibial tuberosity)   yes Pitting edema (greater in the symptomatic leg)   no Collateral superficial veins (nonvaricose)     Additionally:   no Alternative diagnosis as likely or more likely than that of deep venous thrombosis (-2 from score)    Score Total:  High probability  3 or greater  Moderate probability  1 or 2  Low probability  0 or less    Modification:  This clinical model has been modified to take one other clinical feature into account: a previously documented deep vein thrombosis (DVT) is given the score of 1. Using this modified scoring system, DVT is either likely or unlikely, as follows:  DVT likely 2 or greater   DVT unlikely 1 or less    3. Interpret results with probability  High Probability   (If Normal U/S, Confirm with Venography)  Moderate Probability  (If Normal U/S f/u U/S in one week)  Low Probability   (If Abnormal U/S, Confirm with Venography)    Comment: Formal ordered.   This suggestion is concordant with my clinical acumen.   Ella JubileeJames Gabriel, MD 6:10 PM 07/06/2016  ----------------------------------------------------------------------------------------------------------------------      ED Course & Re-Evaluation     ED Course     Bedside ultrasound equivocals, poor flow, turbulence, proximal left common fem/junction. Pain in popliteal b/l, with b/l pop poor compressibility conerning for presence of DVT. Formal ordered, though insetting of bilateral edema, will additionally consider CHF as cause, bedside evaluation of heart with good squeeze, no effusion, no RHS, Pro-BNP ordered, trop ordered, ekg wnl.  Ella JubileeJames Gabriel, MD 7:07 PM 07/06/2016'    Creatinine elevated, will order heparin over lovenox in this setting. Not

## 2016-07-07 NOTE — Progress Notes
[facility] pt to be transfered to room 802, request for transport in.
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
## 2016-07-07 NOTE — ED Provider Notes
High Probability   (If Normal U/S, Confirm with Venography)  Moderate Probability  (If Normal U/S f/u U/S in one week)  Low Probability   (If Abnormal U/S, Confirm with Venography)    Comment: Formal ordered.   This suggestion is concordant with my clinical acumen.   Ella JubileeJames Gabriel, MD 6:10 PM 07/06/2016  ----------------------------------------------------------------------------------------------------------------------      ED Course & Re-Evaluation     ED Course     Bedside ultrasound equivocals, poor flow, turbulence, proximal left common fem/junction. Pain in popliteal b/l, with b/l pop poor compressibility conerning for presence of DVT. Formal ordered, though insetting of bilateral edema, will additionally consider CHF as cause, bedside evaluation of heart with good squeeze, no effusion, no RHS, Pro-BNP ordered, trop ordered, ekg wnl.  Ella JubileeJames Gabriel, MD 7:07 PM 07/06/2016      ED Disposition   ED Disposition: No ED Disposition Set      ED Clinical Impression   ED Clinical Impression:   SOB (shortness of breath)  Leg swelling  Pitting edema      ED Patient Status   Patient Status:   {[facility] STATUS:[phone]}        ED Medical Evaluation Initiated   Medical Evaluation Initiated:   Yes, filed at 07/06/16 1747  by Ella JubileeGabriel, James, MD

## 2016-07-07 NOTE — ED Provider Notes
No medications on file   Discontinued Medications    No medications on file       Past Medical History:   Diagnosis Date   ? Anxiety    ? Depressed    ? Diabetes mellitus    ? Drug abuse    ? Hypertension    ? PTSD (post-traumatic stress disorder)        Past Surgical History:   Procedure Laterality Date   ? BACK SURGERY     ? KNEE SURGERY         No family history on file.    Social History     Social History   ? Marital status: Single     Spouse name: N/A   ? Number of children: N/A   ? Years of education: N/A     Social History Main Topics   ? Smoking status: Never Smoker   ? Smokeless tobacco: Never Used   ? Alcohol use No   ? Drug use: None   ? Sexual activity: Not Asked     Other Topics Concern   ? None     Social History Narrative   ? None       Review of Systems   Constitutional: Negative.    HENT: Negative.    Eyes: Negative.    Respiratory: Positive for shortness of breath.    Cardiovascular: Positive for leg swelling. Negative for palpitations.   Gastrointestinal: Negative.    Genitourinary: Negative.    Musculoskeletal: Negative.  Positive for peripheral edema.   Skin: Negative.    Neurological: Negative.    Psychiatric/Behavioral: Negative.    Allergic/Immunologic: negative.    Endocrine: negative.       Physical Exam       ED Triage Vitals   BP 07/06/16 1721 111/73   Pulse 07/06/16 1721 80   Resp 07/06/16 1721 18   Temp 07/06/16 1721 37.2 ?C (98.9 ?F)   Temp src 07/06/16 1721 Oral   Height --    Weight 07/06/16 1932 93 kg   SpO2 07/06/16 1721 100 %   BMI (Calculated) --              Physical Exam   Constitutional: He appears well-developed.   HENT:   Head: Normocephalic.   Nose: No rhinorrhea. No epistaxis.   Eyes: Pupils are equal, round, and reactive to light. No scleral icterus.   Neck: No tracheal deviation present.   Cardiovascular: Regular rhythm and normal heart sounds.    No murmur heard.  Pulmonary/Chest: Effort normal. No stridor. He has no rales.

## 2016-07-07 NOTE — ED Provider Notes
Neutrophils % 56.9  34.0 - 73.0 %    Lymphocytes % 29.6  25.0 - 45.0 %    Eosinophils % 3.4  1.0 - 4.0 %    Immature Granulocytes % 0.4  0.0 - 2.0 %    Neutrophils Absolute 3.00  1.80 - 8.70 x10E3/uL    Lymphocytes Absolute 1.56  x10E3/uL    Monocytes Absolute 0.50  x10E3/uL    Eosinophils Absolute 0.18  x10E3/uL    Basophil Absolute 0.01  x10E3/uL    Basophils % 0.2  0 - 1 %   D-DIMER,QUANTITATIVE - Abnormal     D Dimer (hs) 1.18 (*) 0.00 - 0.49 ug/mL (FEU)    Comment: A cut-off for the exclusion of DVT and PE has not been established for this method   BRAIN NATRIURETIC PEPTIDE - Normal    NT PRO BNP 105  0 - 125 pg/ml   PROTIME-INR - Normal    Protime 12.2  11.9 - 14.3 seconds    INR 0.9  0.9 - 1.1   APTT - Normal    PTT 35  24 - 35 seconds   POCT TROPININ I - Normal    Troponin I (Point of Care) <0.05  0.00 - 0.23 ng/mL   HEPATIC FUNCTION PANEL   CBC   POCT TROPININ I   CBC AND DIFFERENTIAL         Imaging (Read by ED Provider):  ***     EKG (Read by ED Provider):  Not consistent with ischemic/or high risk findings. (No new: STE, STD 0.5 mm+, TWI 441mm+, contiguous q waves x 2, New LBBB) Ella JubileeJames Gabriel, MD 7:06 PM 07/06/2016      MDM  Number of Diagnoses or Management Options     Amount and/or Complexity of Data Reviewed  Clinical lab tests: ordered and reviewed  Tests in the radiology section of CPT?: ordered and reviewed  Review and summarize past medical records: yes    ----------------------------------------------------------------------------------------------------------------------    Pretest probability of deep vein thrombosis (Wells score) & Diagnostic Approach    1. Compression Ultrasonography     2. Assess Pretest Probability    Clinical feature:      Score: (Add +1 per every yes)  no Active cancer (treatment ongoing or within the previous six months or palliative)   no Paralysis, paresis, or recent plaster immobilization of the lower extremities

## 2016-07-07 NOTE — Consults
[facility] and gets [facility] from [facility]. .  Patient is home less and does not [facility]  [place]

## 2016-07-07 NOTE — ED Notes
Pt otf with RN to CCU, vss, nad, A&Ox3.

## 2016-07-07 NOTE — ED Provider Notes
Hemoglobin 11.0 (*) 14.0 - 18.0 g/dL    Hematocrit 16.133.0 (*) 40.0 - 54.0 %    MCV 81.3 (*) 82.0 - 101.0 fl    Monocytes % 9.5 (*) 2.0 - 6.0 %    WBC 5.27  4.5 - 11 x10E3/uL    MCH 27.1  27.0 - 34.0 pg    MCHC 33.3  31.0 - 36.0 g/dL    RDW 09.614.8  04.512.0 - 40.916.1 %    Platelet Count 204  140 - 440 thou/cu mm    MPV 9.5  9.5 - 11.5 fl    nRBC % 0.0  0.0 - 1.0 %    Absolute NRBC Count 0.00      Neutrophils % 56.9  34.0 - 73.0 %    Lymphocytes % 29.6  25.0 - 45.0 %    Eosinophils % 3.4  1.0 - 4.0 %    Immature Granulocytes % 0.4  0.0 - 2.0 %    Neutrophils Absolute 3.00  1.80 - 8.70 x10E3/uL    Lymphocytes Absolute 1.56  x10E3/uL    Monocytes Absolute 0.50  x10E3/uL    Eosinophils Absolute 0.18  x10E3/uL    Basophil Absolute 0.01  x10E3/uL    Basophils % 0.2  0 - 1 %   D-DIMER,QUANTITATIVE - Abnormal     D Dimer (hs) 1.18 (*) 0.00 - 0.49 ug/mL (FEU)    Comment: A cut-off for the exclusion of DVT and PE has not been established for this method   BRAIN NATRIURETIC PEPTIDE - Normal    NT PRO BNP 105  0 - 125 pg/ml   PROTIME-INR - Normal    Protime 12.2  11.9 - 14.3 seconds    INR 0.9  0.9 - 1.1   APTT - Normal    PTT 35  24 - 35 seconds   POCT TROPININ I - Normal    Troponin I (Point of Care) <0.05  0.00 - 0.23 ng/mL   HEPATIC FUNCTION PANEL   CBC   POCT TROPININ I   CBC AND DIFFERENTIAL         Imaging (Read by ED Provider):  Per Radiology:  [place] Venous Doppler Lower Ext Bilateral  Result Date: 07/06/2016  Impression: Thrombus within the right femoral vein and left popliteal vein. [name] was notified of the above findings by telephone on 07/06/2016 at 7:16 PM.  This individual acknowledged receipt of the information and repeated it back.     Per Radiology:  Xr Chest Single View  Result Date: 07/06/2016  No acute cardiopulmonary process.        EKG (Read by ED Provider):  Not consistent with ischemic/or high risk findings. (No new: STE, STD 0.5 mm+, TWI 91mm+, contiguous q waves x 2, New LBBB) Ella JubileeJames Gabriel, MD 7:06 PM 07/06/2016

## 2016-07-07 NOTE — ED Provider Notes
?   BACK SURGERY     ? KNEE SURGERY         No family history on file.    Social History     Social History   ? Marital status: N/A     Spouse name: N/A   ? Number of children: N/A   ? Years of education: N/A     Social History Main Topics   ? Smoking status: Never Smoker   ? Smokeless tobacco: Never Used   ? Alcohol use No   ? Drug use: None   ? Sexual activity: Not Asked     Other Topics Concern   ? None     Social History Narrative   ? None       Review of Systems   Constitutional: Negative.    HENT: Negative.    Eyes: Negative.    Respiratory: Positive for shortness of breath.    Cardiovascular: Positive for leg swelling. Negative for palpitations.   Gastrointestinal: Negative.    Genitourinary: Negative.    Musculoskeletal: Negative.    Skin: Negative.    Neurological: Negative.    Psychiatric/Behavioral: Negative.    Allergic/Immunologic: negative.    Endocrine: negative.       Physical Exam     ED Triage Vitals   BP 07/06/16 1721 111/73   Pulse 07/06/16 1721 80   Resp 07/06/16 1721 18   Temp 07/06/16 1721 37.2 ?C (98.9 ?F)   Temp src 07/06/16 1721 Oral   Height --    Weight --    SpO2 07/06/16 1721 100 %   BMI (Calculated) --              Physical Exam   Constitutional: He appears well-developed.   HENT:   Head: Normocephalic.   Nose: No rhinorrhea. No epistaxis.   Eyes: Pupils are equal, round, and reactive to light. No scleral icterus.   Neck: No tracheal deviation present.   Cardiovascular: Regular rhythm and normal heart sounds.    No murmur heard.  Pulmonary/Chest: Effort normal. No stridor. He has no rales.   Abdominal: He exhibits no distension and no mass. There is no guarding.   Musculoskeletal: Normal range of motion.   Skin: Skin is warm.   Nursing note and vitals reviewed.      Differential DDx: CHF, Liver Disease, kidney disease, DVT, other    Is this an Emergent Medical Condition? Yes - Severe Pain/Acute Onset of Symptons  409.901 FS  641.19 FS  627.732 (16) FS    ED Workup

## 2016-07-07 NOTE — H&P
Sodium 144 135 - 145 mmol/L    Potassium 4.1 3.3 - 4.6 mmol/L    Chloride 103 101 - 110 mmol/L    CO2 25 21 - 29 mmol/L    Urea Nitrogen 35 (H) 6 - 22 mg/dL    Creatinine 2.11 (H) 0.67 - 1.17 mg/dL    BUN/Creatinine Ratio 16.6 6.0 - 22.0 (calc)    Glucose 144 (H) 71 - 99 mg/dL    Calcium 8.7 8.6 - 10.0 mg/dL    Osmolality Calc 297.3     Anion Gap 16 4 - 16 mmol/L    EGFR 40 mL/min/1.73M2   Pro BNP    Collection Time: 07/06/16  6:37 PM   Result Value Ref Range    NT PRO BNP 105 0 - 125 pg/ml   CBC with Differential panel result    Collection Time: 07/06/16  6:37 PM   Result Value Ref Range    WBC 5.27 4.5 - 11 x10E3/uL    RBC 4.06 (L) 4.50 - 6.30 x10E6/uL    Hemoglobin 11.0 (L) 14.0 - 18.0 g/dL    Hematocrit 33.0 (L) 40.0 - 54.0 %    MCV 81.3 (L) 82.0 - 101.0 fl    MCH 27.1 27.0 - 34.0 pg    MCHC 33.3 31.0 - 36.0 g/dL    RDW 14.8 12.0 - 16.1 %    Platelet Count 204 140 - 440 thou/cu mm    MPV 9.5 9.5 - 11.5 fl    nRBC % 0.0 0.0 - 1.0 %    Absolute NRBC Count 0.00     Neutrophils % 56.9 34.0 - 73.0 %    Lymphocytes % 29.6 25.0 - 45.0 %    Monocytes % 9.5 (H) 2.0 - 6.0 %    Eosinophils % 3.4 1.0 - 4.0 %    Immature Granulocytes % 0.4 0.0 - 2.0 %    Neutrophils Absolute 3.00 1.80 - 8.70 x10E3/uL    Lymphocytes Absolute 1.56 x10E3/uL    Monocytes Absolute 0.50 x10E3/uL    Eosinophils Absolute 0.18 x10E3/uL    Basophil Absolute 0.01 x10E3/uL    Basophils % 0.2 0 - 1 %   Hepatic Function Panel    Collection Time: 07/06/16  6:37 PM   Result Value Ref Range    Albumin 3.5 (L) 3.8 - 4.9 g/dL    Total Bilirubin 0.2 0.2 - 1.0 mg/dL    Bilirubin, Direct 0.1 0.0 - 0.2 mg/dL    Bilirubin, Indirect 0.1 mg/dL    Alkaline Phosphatase 59 40 - 129 IU/L    AST 18 14 - 33 IU/L    ALT 15 10 - 42 IU/L    Total Protein 6.3 (L) 6.5 - 8.3 g/dL    ALBUMIN/GLOBULIN RATIO 1.3 (calc)    Calc Total Globuin 2.8 gm/dL   ECG - Tracing (autopage)    Collection Time: 07/06/16  6:41 PM   Result Value Ref Range    Ventricular Rate 72 BPM

## 2016-07-07 NOTE — ED Provider Notes
Potassium 4.1  3.3 - 4.6 mmol/L    Chloride 103  101 - 110 mmol/L    CO2 25  21 - 29 mmol/L    BUN/Creatinine Ratio 16.6  6.0 - 22.0 (calc)    Calcium 8.7  8.6 - 10.0 mg/dL    Osmolality Calc 297.3      Anion Gap 16  4 - 16 mmol/L    EGFR 40  mL/min/1.73M2    Comment:   Reference range: =>90 ml/min/1.73M2  Stage 3 Chronic Kidney Disease (30-59)  Stage 4 Chronic Kidney Disease (15-29)  Stage 5 or Renal Failure (<15)   CBC AUTODIFF - Abnormal     RBC 4.06 (*) 4.50 - 6.30 x10E6/uL    Hemoglobin 11.0 (*) 14.0 - 18.0 g/dL    Hematocrit 33.0 (*) 40.0 - 54.0 %    MCV 81.3 (*) 82.0 - 101.0 fl    Monocytes % 9.5 (*) 2.0 - 6.0 %    WBC 5.27  4.5 - 11 x10E3/uL    MCH 27.1  27.0 - 34.0 pg    MCHC 33.3  31.0 - 36.0 g/dL    RDW 14.8  12.0 - 16.1 %    Platelet Count 204  140 - 440 thou/cu mm    MPV 9.5  9.5 - 11.5 fl    nRBC % 0.0  0.0 - 1.0 %    Absolute NRBC Count 0.00      Neutrophils % 56.9  34.0 - 73.0 %    Lymphocytes % 29.6  25.0 - 45.0 %    Eosinophils % 3.4  1.0 - 4.0 %    Immature Granulocytes % 0.4  0.0 - 2.0 %    Neutrophils Absolute 3.00  1.80 - 8.70 x10E3/uL    Lymphocytes Absolute 1.56  x10E3/uL    Monocytes Absolute 0.50  x10E3/uL    Eosinophils Absolute 0.18  x10E3/uL    Basophil Absolute 0.01  x10E3/uL    Basophils % 0.2  0 - 1 %   D-DIMER,QUANTITATIVE - Abnormal     D Dimer (hs) 1.18 (*) 0.00 - 0.49 ug/mL (FEU)    Comment: A cut-off for the exclusion of DVT and PE has not been established for this method   BRAIN NATRIURETIC PEPTIDE - Normal    NT PRO BNP 105  0 - 125 pg/ml   PROTIME-INR - Normal    Protime 12.2  11.9 - 14.3 seconds    INR 0.9  0.9 - 1.1   APTT - Normal    PTT 35  24 - 35 seconds   POCT TROPININ I - Normal    Troponin I (Point of Care) <0.05  0.00 - 0.23 ng/mL   HEPATIC FUNCTION PANEL   CBC   POCT TROPININ I   CBC AND DIFFERENTIAL         Imaging (Read by ED Provider):  Per Radiology:  US Venous Doppler Lower Ext Bilateral  Result Date: 07/06/2016

## 2016-07-07 NOTE — ED Provider Notes
MDM  Number of Diagnoses or Management Options     Amount and/or Complexity of Data Reviewed  Clinical lab tests: ordered and reviewed  Tests in the radiology section of CPT?: ordered and reviewed  Review and summarize past medical records: yes    ----------------------------------------------------------------------------------------------------------------------    Pretest probability of deep vein thrombosis (Wells score) & Diagnostic Approach    1. Compression Ultrasonography     2. Assess Pretest Probability    Clinical feature:      Score: (Add +1 per every yes)  no Active cancer (treatment ongoing or within the previous six months or palliative)   no Paralysis, paresis, or recent plaster immobilization of the lower extremities    no Recently bedridden for more than three days or major surgery, within four weeks   yes Localized tenderness along the distribution of the deep venous system   yes Entire leg swollen   no Calf swelling by more than 3 cm when compared to the asymptomatic leg (measured below tibial tuberosity)   yes Pitting edema (greater in the symptomatic leg)   no Collateral superficial veins (nonvaricose)     Additionally:   no Alternative diagnosis as likely or more likely than that of deep venous thrombosis (-2 from score)    Score Total:  High probability  3 or greater  Moderate probability  1 or 2  Low probability  0 or less    Modification:  This clinical model has been modified to take one other clinical feature into account: a previously documented deep vein thrombosis (DVT) is given the score of 1. Using this modified scoring system, DVT is either likely or unlikely, as follows:  DVT likely 2 or greater   DVT unlikely 1 or less    3. Interpret results with probability  High Probability   (If Normal U/S, Confirm with Venography)  Moderate Probability  (If Normal U/S f/u U/S in one week)  Low Probability   (If Abnormal U/S, Confirm with Venography)    Comment: Formal ordered.

## 2016-07-07 NOTE — ED Provider Notes
Abdominal: He exhibits no distension and no mass. There is no guarding.   Musculoskeletal: Normal range of motion.   Skin: Skin is warm.   Nursing note and vitals reviewed.      Differential DDx: CHF, Liver Disease, kidney disease, DVT, other    Is this an Emergent Medical Condition? Yes - Severe Pain/Acute Onset of Symptons  409.901 FS  641.19 FS  627.732 (16) FS    ED Workup   ED Ultrasound Performed  Date/Time: 07/06/2016 6:43 PM  Performed by: Ella JubileeGABRIEL, JAMES  Authorized by: Ella JubileeGABRIEL, JAMES   The ultrasound procedure performed was deep vein thrombosis ultrasound.   Reasons for the procedure performed included leg pain and leg swelling.   Evaluated for DVT: right leg evaluated for deep vein thrombosis and left leg evaluated for deep vein thrombosis.  All vessels compressible? no.  Lack of compressibility? yes.  Lack of compressibility location: common femoral and junction of deep and superficial veins.  Clot visible? yes (equivical).  Patient position: supine.  Documentation: images saved on hard disk and still images obtained.  Patient tolerance: Patient tolerated the procedure well with no immediate complications    Patient tolerance: Patient tolerated the procedure well with no immediate complications.    ED Ultrasound Performed  Date/Time: 07/06/2016 8:17 PM  Performed by: Ella JubileeGABRIEL, JAMES  Authorized by: Ella JubileeGABRIEL, JAMES   The ultrasound procedure performed was echocardiogram.   Reasons for the procedure performed included shortness of breath (SOB).   Abnormal cardiac activity? no.  Abnormal wall motion? no.  Pericardial effusion? no.  Abnormal ejection fraction? no.  Patient position: supine.  Documentation: images saved on hard disk and still images obtained.          Labs:  -   BASIC METABOLIC PANEL - Abnormal        Result Value Ref Range    Urea Nitrogen 35 (*) 6 - 22 mg/dL    Creatinine 1.612.11 (*) 0.67 - 1.17 mg/dL    Glucose 096144 (*) 71 - 99 mg/dL    Sodium 045144  409135 - 811145 mmol/L

## 2016-07-07 NOTE — ED Provider Notes
ED Ultrasound Performed  Date/Time: 07/06/2016 6:43 PM  Performed by: Leandrew Koyanagi  Authorized by: Leandrew Koyanagi   The ultrasound procedure performed was deep vein thrombosis ultrasound.   Reasons for the procedure performed included leg pain and leg swelling.   Evaluated for DVT: right leg evaluated for deep vein thrombosis and left leg evaluated for deep vein thrombosis.  All vessels compressible? no.  Lack of compressibility? yes.  Lack of compressibility location: common femoral and junction of deep and superficial veins.  Clot visible: equivical.  Patient position: supine.  Documentation: images saved on hard disk and still images obtained.  Patient tolerance: Patient tolerated the procedure well with no immediate complications    Patient tolerance: Patient tolerated the procedure well with no immediate complications.          Labs:  -   BASIC METABOLIC PANEL - Abnormal        Result Value Ref Range    Urea Nitrogen 35 (*) 6 - 22 mg/dL    Creatinine 2.11 (*) 0.67 - 1.17 mg/dL    Glucose 144 (*) 71 - 99 mg/dL    Sodium 144  135 - 145 mmol/L    Potassium 4.1  3.3 - 4.6 mmol/L    Chloride 103  101 - 110 mmol/L    CO2 25  21 - 29 mmol/L    BUN/Creatinine Ratio 16.6  6.0 - 22.0 (calc)    Calcium 8.7  8.6 - 10.0 mg/dL    Osmolality Calc 297.3      Anion Gap 16  4 - 16 mmol/L    EGFR 40  mL/min/1.73M2    Comment:   Reference range: =>90 ml/min/1.73M2  Stage 3 Chronic Kidney Disease (30-59)  Stage 4 Chronic Kidney Disease (15-29)  Stage 5 or Renal Failure (<15)   CBC AUTODIFF - Abnormal     RBC 4.06 (*) 4.50 - 6.30 x10E6/uL    Hemoglobin 11.0 (*) 14.0 - 18.0 g/dL    Hematocrit 33.0 (*) 40.0 - 54.0 %    MCV 81.3 (*) 82.0 - 101.0 fl    Monocytes % 9.5 (*) 2.0 - 6.0 %    WBC 5.27  4.5 - 11 x10E3/uL    MCH 27.1  27.0 - 34.0 pg    MCHC 33.3  31.0 - 36.0 g/dL    RDW 14.8  12.0 - 16.1 %    Platelet Count 204  140 - 440 thou/cu mm    MPV 9.5  9.5 - 11.5 fl    nRBC % 0.0  0.0 - 1.0 %    Absolute NRBC Count 0.00

## 2016-07-07 NOTE — ED Provider Notes
Abdominal: He exhibits no distension and no mass. There is no guarding.   Musculoskeletal: Normal range of motion.   Skin: Skin is warm.   Nursing note and vitals reviewed.      Differential DDx: CHF, Liver Disease, kidney disease, DVT, other    Is this an Emergent Medical Condition? Yes - Severe Pain/Acute Onset of Symptons  409.901 FS  641.19 FS  627.732 (16) FS    ED Workup   ED Ultrasound Performed  Date/Time: 07/06/2016 6:43 PM  Performed by: Leandrew Koyanagi  Authorized by: Leandrew Koyanagi   The ultrasound procedure performed was deep vein thrombosis ultrasound.   Reasons for the procedure performed included leg pain and leg swelling.   Evaluated for DVT: right leg evaluated for deep vein thrombosis and left leg evaluated for deep vein thrombosis.  All vessels compressible? no.  Lack of compressibility? yes.  Lack of compressibility location: common femoral and junction of deep and superficial veins.  Clot visible: equivical.  Patient position: supine.  Documentation: images saved on hard disk and still images obtained.  Patient tolerance: Patient tolerated the procedure well with no immediate complications    Patient tolerance: Patient tolerated the procedure well with no immediate complications.          Labs:  -   BASIC METABOLIC PANEL - Abnormal        Result Value Ref Range    Urea Nitrogen 35 (*) 6 - 22 mg/dL    Creatinine 2.11 (*) 0.67 - 1.17 mg/dL    Glucose 144 (*) 71 - 99 mg/dL    Sodium 144  135 - 145 mmol/L    Potassium 4.1  3.3 - 4.6 mmol/L    Chloride 103  101 - 110 mmol/L    CO2 25  21 - 29 mmol/L    BUN/Creatinine Ratio 16.6  6.0 - 22.0 (calc)    Calcium 8.7  8.6 - 10.0 mg/dL    Osmolality Calc 297.3      Anion Gap 16  4 - 16 mmol/L    EGFR 40  mL/min/1.73M2    Comment:   Reference range: =>90 ml/min/1.73M2  Stage 3 Chronic Kidney Disease (30-59)  Stage 4 Chronic Kidney Disease (15-29)  Stage 5 or Renal Failure (<15)   CBC AUTODIFF - Abnormal     RBC 4.06 (*) 4.50 - 6.30 x10E6/uL

## 2016-07-07 NOTE — Medical Student
-   Plan for d/c in the AM?    AKI vs CKD  - Unclear base line  - Cr 2.11--> 1.77  - monitor strict I/os  - urine lytes ordered  ?  HTN  - currently stable  - c/w home medications  ?  DM  - A1c 6.0  - ISS  ?  FEN/GI: diabetic diet. Monitor electrolytes and replace as needed.  Prophylaxis: heparin gtt  Code status: Full  Dispo: Plan for DC home in the AM    Ernest Price  07/07/2016 8:55 AM

## 2016-07-08 MED ORDER — ASPIRIN 81 MG PO CHEW
81 mg | Freq: Every day | ORAL | Status: DC
Start: 2016-07-08 — End: 2016-07-11

## 2016-07-08 MED ORDER — APIXABAN 5 MG PO TABS
5 mg | Freq: Two times a day (BID) | ORAL | Status: DC
Start: 2016-07-08 — End: 2016-07-11

## 2016-07-08 MED ORDER — CARVEDILOL 3.125 MG PO TABS
3.125 mg | Freq: Two times a day (BID) | ORAL | Status: DC
Start: 2016-07-08 — End: 2016-07-11

## 2016-07-08 MED ORDER — APIXABAN 5 MG PO TABS
10 mg | Freq: Two times a day (BID) | ORAL | Status: DC
Start: 2016-07-08 — End: 2016-07-11

## 2016-07-08 MED ORDER — MAGNESIUM SULFATE 2 G/50ML IV SOLN UD JX
2 g | Freq: Once | INTRAVENOUS | Status: CP
Start: 2016-07-08 — End: ?

## 2016-07-08 MED ORDER — POLYETHYLENE GLYCOL 3350 PO PACK
17 g | Freq: Once | ORAL | Status: CP
Start: 2016-07-08 — End: ?
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
## 2016-07-08 NOTE — Progress Notes
progress for him and is fully aware that the physicians have to be notified and is completely ok with it from his standpoint. The patient is currently calm and he is [facility] to [facility] to see if [facility] can [facility].

## 2016-07-08 NOTE — Progress Notes
Seen on Rapid Response Proactive Patient Safety Rounds due to [facility] protocol. Patient is resting quietly. No distress noted. Sitter at bedside.    L. Hilado  Rapid Response RN
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
## 2016-07-08 NOTE — H&P
Constitutional: Negative for chills and fever.   HENT: Negative for congestion and sore throat.    Eyes: Negative for blurred vision and double vision.   Respiratory: Negative for cough, sputum production and shortness of breath.    Cardiovascular: Positive for leg swelling. Negative for chest pain, palpitations, claudication and PND.   Gastrointestinal: Negative for nausea and vomiting.   Genitourinary: Negative for dysuria and urgency.   Musculoskeletal: Negative for myalgias and neck pain.   Neurological: Negative for dizziness and headaches.         Physical Exam:   VS: BP 132/73 - Pulse 66 - Temp 36.5 ?C (97.7 ?F) (Oral)  - Resp 12 - Ht 1.803 m (5\' 11" ) - Wt 94.2 kg (207 lb 10.8 oz) - SpO2 95% - BMI 28.96 kg/m2    BMI: Body mass index is 28.96 kg/(m^2).    PE:   Gen: NAD. Sitting up in bed on room air.   Head and Eyes: NCAT. EOMI. PERRL.   ENT: MMM and pink. Normal dentition, lips, and gums. Normal oropharynx without erythema, edema or lesions.  Lymphatic:  no cervical  lympadenopathy.   CVS: RRR. Nl s1/s2. No murmur   Lungs: CTAB. No wheezing/rhonchi/rales  Abd: soft, nt, nd. positive bowel sounds.  Ext: DP/PT present symmetrically bilaterally.  lower extremity edema R>L. No discoloration present.     Skin: no rashes or lesions.   Psych: A & O X3.  Normal mood and affect     Data Review:  CBC (with or without Differential):   Lab Results   Component Value Date    WBC 5.24 07/07/2016    HGB 10.3 (L) 07/07/2016    HCT 31.7 (L) 07/07/2016    MCV 82.1 07/07/2016    MCH 26.7 (L) 07/07/2016    MCHC 32.5 07/07/2016    RDW 14.8 07/07/2016    PLATCOUNT 195 07/07/2016    MPV 9.9 07/07/2016    NEUTROPCT 54.0 07/07/2016    MONOPCT 7.8 (H) 07/07/2016    EOSPCT 3.8 07/07/2016    BASOPCT 0.0 07/07/2016   , BMP/CMP:   Lab Results   Component Value Date    NA 144 07/07/2016    K 3.6 07/07/2016    CL 104 07/07/2016    CO2 24 07/07/2016    BUN 36 (H) 07/07/2016    CREATININE 1.77 (H) 07/07/2016    GLU 128 (H) 07/07/2016

## 2016-07-08 NOTE — Progress Notes
MD text paged regarding pt expressing suicidal ideation. Tracking # [id number] [id number]

## 2016-07-08 NOTE — H&P
CALCIUM 8.6 07/07/2016    TPROT 6.3 (L) 07/06/2016    ALB 3.5 (L) 07/06/2016    AST 18 07/06/2016    ALT 15 07/06/2016    EGFR 48 07/07/2016    TBILI 0.2 07/06/2016    ALKPHOS 59 07/06/2016    MG 1.9 07/07/2016   , Hepatic Function:   Lab Results   Component Value Date    ALB 3.5 (L) 07/06/2016    ALKPHOS 59 07/06/2016    ALT 15 07/06/2016    AST 18 07/06/2016    DBILI 0.1 07/06/2016    TBILI 0.2 07/06/2016    TPROT 6.3 (L) 07/06/2016   , Cardiac markers: No results found for: CKMB, CKMBINDEX, CKTOTAL, TROPONINI, TROPONINT, Lipid Profile  No results found for: CHOL, TRIG, HDL, LDL, Magnesium:    Lab Results   Component Value Date    MG 1.9 07/07/2016   , Phosphorus   Phosphorus,Inorganic   Date Value Ref Range Status   07/07/2016 3.7 2.5 - 4.5 mg/dL Final   , Lipase: No results found for: LIPASE and Urinalysis: No results found for: COLOR, CLARITY, PHUR, BILIRUBINUR, GLUCOSEU, HGBUR, KETONESSERUM, PROTUUPE, UROBILUR, WBCU, RBCU, SQUEPI, TRANSEPI, RENALEPI, AMORPHOUS, BACTERIA, UAOTHER, MICROSCOPIC, BILICRY, CYSTEINECRY, CAOXCRY, AMURATCRY, CACARBCRY, CAPHOSCRY, TRICHOMONAS, TRIPPHOSCRY, REDUSUBQUAL, URICACID, MUCUS, WBCCLUMPS, OVALFATBOD, FATUR, BUDDYEAST, YEASTWHYPH, BROADCAST, EPICAST, FATTYCAST, [place], [place], RBCCAST, CRSEGRNCAST, FINEGRNCAST, HYACAST      Micro:  No results found for this visit on 07/06/16.      Imaging:  Per Radiology: US Venous Doppler Lower Ext Bilateral    Result Date: 07/06/2016  BILATERAL LOWER EXTREMITY DEEP VENOUS ULTRASOUND    HISTORY: 55 years Male Leg swelling      Technique: Real time ultrasound is used to examine the deep venous system of bilateral lower extremities. Gray scale with compression maneuvers, Color Doppler and Spectral Doppler at rest and with augmentation of the common femoral, profunda femoral, femoral and popliteal veins was performed bilaterally. Images were obtained and stored in a permanent archive. Comparison: None Result: Right lower extremity:

## 2016-07-08 NOTE — H&P
No acute cardiopulmonary process.      Impression:   Mr. Freddi Starrhomas Gretzinger is a 55 y.o. male with PMH significant for HTN, DM who presented with leg pain. Admitted to medicine for DVT.  ?  Provoked DVT  -likely secondary to prolonged travel this week.   -Pt reports pain in right leg. Physical exam siginicant for swelling in lower extremities R>L. 2/2 distal pulses.   -LE US with right femoral and left popliteal DVT   -currently on hep gtt  -switch to NOAC once renal function improves  -f/u hypercoagulable workup and ECHO to evaluate for right heart strain.   ?  AKI on CKD vs CKD - improving   -Cr. 2.11 -> 1.6   -baseline unclear   -monitor strict I/Os  -F/u urine lytes   ?  HTN  -120-140s  -chlorthalidone 25     DM  -a1c 6  -ISS    Disposition: Discharge home tomorrow once switched to NOAC and ECHO completed  Prophylaxis: Hep gtt  FEN/GI: replace all electrolytes PRN.  Cardiac diet.  Code status: Full     Gaspar ColaUnnati Wardan DO  PGY 1, IM  07/07/2016, 7:01 AM

## 2016-07-08 NOTE — H&P
Deep venous system: Common femoral vein: No thrombus is identified in the visualized portions of the common femoral  vein which demonstrates normal compressibility and phasic flow on pulsed Doppler evaluation. Femoral vein: Thrombus throughout the right femoral vein. Popliteal vein: No thrombus is identified in the visualized portions of the popliteal vein which demonstrates normal compressibility and phasic flow on pulsed Doppler evaluation. Superficial venous system: The visualized portions of the greater saphenous vein is patent without thrombus. Left lower extremity: Deep venous system: Common femoral vein: No thrombus is identified in the visualized portions of the common femoral  vein which demonstrates normal compressibility and phasic flow on pulsed Doppler evaluation. Femoral vein: No thrombus is identified in the visualized portions of the femoral vein which demonstrates normal compressibility and phasic flow on pulsed Doppler evaluation. Popliteal vein: Thrombus is visualized within the left popliteal vein. Superficial venous system: The visualized portions of the greater saphenous vein is patent without thrombus.     Impression: Thrombus within the right femoral vein and left popliteal vein. [name] was notified of the above findings by telephone on 07/06/2016 at 7:16 PM.  This individual acknowledged receipt of the information and repeated it back.     Per Radiology: Xr Chest Single View    Result Date: 07/06/2016  Study: XR CHEST SINGLE VIEW HISTORY: [age] years Male. INDICATION: SOB (shortness of breath). TECHNIQUE: XR CHEST SINGLE VIEW. COMPARISON: None. FINDINGS: The trachea is midline. The cardiomediastinal silhouette and pulmonary vasculature are within normal limits. No pneumothorax or pleural effusion is identified. No focal airspace opacity is seen. Partial visualization of thoracolumbar fusion hardware. Surgical clips are seen in the region of the gastroesophageal junction.

## 2016-07-08 NOTE — Progress Notes
Seen for evaluation of transfer from Intensive Care Unit. Respirations are even and unlabored. Denies complaints. No distress noted.    L. Hilado  Rapid Response RN

## 2016-07-09 MED ORDER — DULOXETINE HCL 60 MG PO CPEP
60 mg | Freq: Every day | ORAL | Status: DC
Start: 2016-07-09 — End: 2016-07-11

## 2016-07-09 MED ORDER — CHLORTHALIDONE 25 MG PO TABS
25 mg | Freq: Every day | ORAL | Status: DC
Start: 2016-07-09 — End: 2016-07-11

## 2016-07-09 NOTE — Progress Notes
thrombus is identified in the visualized portions of the popliteal vein which demonstrates normal compressibility and phasic flow on pulsed Doppler evaluation. Superficial venous system: The visualized portions of the greater saphenous vein is patent without thrombus. Left lower extremity: Deep venous system: Common femoral vein: No thrombus is identified in the visualized portions of the common femoral  vein which demonstrates normal compressibility and phasic flow on pulsed Doppler evaluation. Femoral vein: No thrombus is identified in the visualized portions of the femoral vein which demonstrates normal compressibility and phasic flow on pulsed Doppler evaluation. Popliteal vein: Thrombus is visualized within the left popliteal vein. Superficial venous system: The visualized portions of the greater saphenous vein is patent without thrombus.   ?  Impression: Thrombus within the right femoral vein and left popliteal vein. [name] was notified of the above findings by telephone on 07/06/2016 at 7:16 PM.  This individual acknowledged receipt of the information and repeated it back.   ?  Per Radiology: Xr Chest Single View  ?  Result Date: 07/06/2016  Study: XR CHEST SINGLE VIEW HISTORY: 55 years Male. INDICATION: SOB (shortness of breath). TECHNIQUE: XR CHEST SINGLE VIEW. COMPARISON: None. FINDINGS: The trachea is midline. The cardiomediastinal silhouette and pulmonary vasculature are within normal limits. No pneumothorax or pleural effusion is identified. No focal airspace opacity is seen. Partial visualization of thoracolumbar fusion hardware. Surgical clips are seen in the region of the gastroesophageal junction.   ?  No acute cardiopulmonary process.     TTE    CONCLUSIONS  -----------  1. Overall left ventricular systolic function is normal with, an EF between 60 - 65 %.  2. The diastolic filling pattern indicates impaired relaxation.  3. No regional wall motion abnormalities were noted.

## 2016-07-09 NOTE — Progress Notes
4. The RV was not well visualized.  5. Unable to estimate RVSP due to inadequate TR jet spectral doppler profile.  Electronically Signed By: ?Floyce StakesSrinivasan Sattiraju MD  Electronically Signed On: 2016/07/07 12:53:08    Assessment & Plan:   Mr.?Ernest Price?is a 55 y.o.?male?with PMH significant for HTN, DM?who presented with leg pain. Admitted to medicine for DVT.  ??  Provoked DVT (second)  -possibly secondary to prolonged travel this week. No trauma.  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 Limb perfusion assured.  - c/w Eliquis, AKI has resolved  -ECHO with EF 60-65% with impaired relaxation   -f/u hypercoagulable workup as this is second event    Suicidal ideation   -Pt reported suicidal ideation 10/10 after being informed of possible discharge. He reports he feels sad and overwhelmed since he is homeless and has not been able to find a shelter. He denies a plan and denies homicidal ideation. He denies any hx of suicidal attempt.   -Consulted ETU for further evaluation   -No psychosis or agitated behaviour  -sitter ordered until cleared by psych  ??  HTN  -140-160s  -d/c chlorthalidone 25   -started coreg 3.125 BID  ?  DM  -a1c 6  -ISS  ?  Disposition: Pt is currently homeless and speaking with CM regarding options. Awaiting ETU evaluation regarding suicidal ideation.   Prophylaxis: Eliquis   FEN/GI: replace all electrolytes PRN.  Cardiac diet.  Code status: Full     Adele SchilderSatish Maharaj, MD    Internal Medicine PGY-2  07/09/2016 6:34 AM

## 2016-07-09 NOTE — Progress Notes
ENT: MMM and pink. Normal dentition, lips, and gums. Normal oropharynx without erythema, edema or lesions.  Lymphatic:  no cervical  lympadenopathy.   CVS: RRR. Nl s1/s2. No murmur   Lungs: CTAB. No wheezing/rhonchi/rales  Abd: soft, nt, nd. positive bowel sounds.  Ext: DP/PT present symmetrically bilaterally.  lower extremity edema R>L which is diminishing, minimally tender to palpation. No discoloration present.   Skin: no rashes or lesions.   Neuro: A & O X3.  Gait normal. Normal tone, power and reflexes.  Psych - normal mood and affect with no s/s of psychosis    Microbiology:   No results found for this visit on 07/06/16.    Labs:  Recent Labs      07/07/16   0247  07/08/16   0346  07/09/16   0519   WBC  5.24  4.58  3.81*   HGB  10.3*  11.1*  10.9*   HCT  31.7*  33.8*  33.2*     Recent Labs      07/07/16   0247  07/08/16   0346  07/09/16   0519   NA  144  141  141   K  3.6  3.6  4.0   CL  104  102  102   CO2  24  25  26    BUN  36*  28*  29*   CREATININE  1.77*  1.16  1.32*   GLU  128*  174*  88   MG  1.9  1.7*  2.1     No results for input(s): TROPONINT, CKMB, CKTOTAL in the last 72 hours.      Radiology:    Per Radiology: [place] Venous Doppler Lower Ext Bilateral  ?  Result Date: 07/06/2016  BILATERAL LOWER EXTREMITY DEEP VENOUS ULTRASOUND    HISTORY: [age] years Male Leg swelling      Technique: Real time ultrasound is used to examine the deep venous system of bilateral lower extremities. Gray scale with compression maneuvers, Color Doppler and Spectral Doppler at rest and with augmentation of the common femoral, profunda femoral, femoral and popliteal veins was performed bilaterally. Images were obtained and stored in a permanent archive. Comparison: None Result: Right lower extremity: Deep venous system: Common femoral vein: No thrombus is identified in the visualized portions of the common femoral  vein which demonstrates normal compressibility and phasic flow on pulsed Doppler evaluation. Femoral

## 2016-07-09 NOTE — Progress Notes
Patient requested RN to call MD regarding diet order. RN paged MD. Waiting for call back.

## 2016-07-09 NOTE — Progress Notes
Seen for evaluation of transfer from Intensive Care Unit. Alert and oriented. Respirations are even and unlabored. Denies complaints. No distress noted.

## 2016-07-09 NOTE — Progress Notes
Lymphatic:  no cervical  lympadenopathy.   CVS: RRR. Nl s1/s2. No murmur   Lungs: CTAB. No wheezing/rhonchi/rales  Abd: soft, nt, nd. positive bowel sounds.  Ext: DP/PT present symmetrically bilaterally.  lower extremity edema R>L which is diminishing, minimally tender to palpation. No discoloration present.   Skin: no rashes or lesions.   Neuro: A & O X3.  Gait normal. Normal tone, power and reflexes.  Psych - normal mood and affect with no s/s of psychosis    Microbiology:   No results found for this visit on 07/06/16.    Labs:  Recent Labs      07/07/16   0247  07/08/16   0346  07/09/16   0519   WBC  5.24  4.58  3.81*   HGB  10.3*  11.1*  10.9*   HCT  31.7*  33.8*  33.2*     Recent Labs      07/07/16   0247  07/08/16   0346  07/09/16   0519   NA  144  141  141   K  3.6  3.6  4.0   CL  104  102  102   CO2  24  25  26    BUN  36*  28*  29*   CREATININE  1.77*  1.16  1.32*   GLU  128*  174*  88   MG  1.9  1.7*  2.1     No results for input(s): TROPONINT, CKMB, CKTOTAL in the last 72 hours.      Radiology:    Per Radiology: [place] Venous Doppler Lower Ext Bilateral  ?  Result Date: 07/06/2016  BILATERAL LOWER EXTREMITY DEEP VENOUS ULTRASOUND    HISTORY: [age] years Male Leg swelling      Technique: Real time ultrasound is used to examine the deep venous system of bilateral lower extremities. Gray scale with compression maneuvers, Color Doppler and Spectral Doppler at rest and with augmentation of the common femoral, profunda femoral, femoral and popliteal veins was performed bilaterally. Images were obtained and stored in a permanent archive. Comparison: None Result: Right lower extremity: Deep venous system: Common femoral vein: No thrombus is identified in the visualized portions of the common femoral  vein which demonstrates normal compressibility and phasic flow on pulsed Doppler evaluation. Femoral vein: Thrombus throughout the right femoral vein. Popliteal vein: No

## 2016-07-09 NOTE — Progress Notes
Internal Medicine Team C  Progress Note    Date of Admission: 07/06/2016   LOS: 3 days     Subjective:     Overnight Ernest Price had no acute complaints. He was seen this morning in bed playing cards on his phone and enjoying it. He had a jovial mood and normal affect. Understands the treatment course and says he has a PCP visit scheduled for d/c.     Awaiting the pcyhiatry evaluation; upon hearing discharge plans yesterday he reported to the RN yesterday he had suicidal ideation but no plan. A sitter was ordered at bedside due to SI (pending psych eval) but the order was cancelled - order re-placed pending investigation on cacellation.    Objective:   BP 168/89 - Pulse 61 - Temp 36.8 ?C (98.2 ?F) (Oral)  - Resp 18 - Ht 1.803 m (5\' 11" ) - Wt 94.2 kg (207 lb 10.8 oz) - SpO2 99% - BMI 28.96 kg/m2  Body mass index is 28.96 kg/(m^2).      Intake/Output Summary (Last 24 hours) at 07/09/16 0634  Last data filed at 07/08/16 1555   Gross per 24 hour   Intake              100 ml   Output                0 ml   Net              100 ml       SCHEDULED MEDS:  ? apixaban  10 mg Oral BID    Followed by   ? Melene Muller[START ON 07/15/2016] apixaban  5 mg Oral BID   ? aspirin  81 mg Oral daily   ? atorvastatin  40 mg Oral Nightly   ? carvedilol  3.125 mg Oral BID   ? DULoxetine  60 mg Oral Nightly   ? gabapentin  600 mg Oral TID   ? insulin aspart  0-3 Units Subcutaneous TID AC   ? insulin aspart  0-4 Units Subcutaneous Nightly     IV INFUSIONS:     PRN MEDS:    acetaminophen 650 mg Oral Q4H PRN   dextrose 30 mL Intravenous PRN   glucose 16 g Oral PRN   HYDROcodone-acetaminophen 1 tablet Oral Q6H PRN   mirtazapine 30 mg Oral Nightly PRN   perflutren lipid microspheres 0.2-1.3 mL Intravenous PRN       Physical Exam  Gen: NAD. Sitting up in bed on room air with no distress.   Head and Eyes: NCAT. EOMI. PERRL.   ENT: MMM and pink. Normal dentition, lips, and gums. Normal oropharynx without erythema, edema or lesions.

## 2016-07-09 NOTE — Plan of Care
Problem: Pain, Acute & Chronic  Goal: Pain is relieved/acceptable level with minimal side effects  Patient experienced pain 7 out of 10. [facility], reeducating the patient on pain regimen frequency. Patient verbalized understanding. Upon reassessment, patient stated his pain decreased to a 4 out of 10. Will continue to monitor and treat PRN.     Problem: Risk for Injury R/T Falls  Goal: Prevent falls  Patient in bed with two side rails up, wheels locked, in the lowest position with the call light within reach. AxOx4. Nonskid footwear on and sitter at the bedside. Will continue to monitor.     Problem: Risk for Self Harm  Goal: Denies intent to harm self  Intervention: Assess risk for self harm  Sitter at the bedside per MD order. Waiting for psych eval. Patient denies intent to harm self, apologizing for his comment yesterday. He states he did not mean what he said.

## 2016-07-09 NOTE — Progress Notes
demonstrates normal compressibility and phasic flow on pulsed Doppler evaluation. Femoral vein: No thrombus is identified in the visualized portions of the femoral vein which demonstrates normal compressibility and phasic flow on pulsed Doppler evaluation. Popliteal vein: Thrombus is visualized within the left popliteal vein. Superficial venous system: The visualized portions of the greater saphenous vein is patent without thrombus.   ?  Impression: Thrombus within the right femoral vein and left popliteal vein. [name] was notified of the above findings by telephone on 07/06/2016 at 7:16 PM.  This individual acknowledged receipt of the information and repeated it back.   ?  Per Radiology: Xr Chest Single View  ?  Result Date: 07/06/2016  Study: XR CHEST SINGLE VIEW HISTORY: [age] years Male. INDICATION: SOB (shortness of breath). TECHNIQUE: XR CHEST SINGLE VIEW. COMPARISON: None. FINDINGS: The trachea is midline. The cardiomediastinal silhouette and pulmonary vasculature are within normal limits. No pneumothorax or pleural effusion is identified. No focal airspace opacity is seen. Partial visualization of thoracolumbar fusion hardware. Surgical clips are seen in the region of the gastroesophageal junction.   ?  No acute cardiopulmonary process.     Assessment & Plan:   Mr.?Ernest Price?is a 55 y.o.?male?with PMH significant for HTN, DM?who presented with leg pain. Admitted to medicine for DVT.  ??  Provoked DVT  -likely secondary to prolonged travel this week.    -LE [place] with right femoral and left popliteal DVT   -s/p hep gtt  -switched to Eliquis this morning   -ECHO with EF 60-65% with impaired relaxation   -f/u hypercoagulable workup    Suicidal ideation   -Pt reported suicidal ideation this morning. He reports he feels sad and overwhelmed since he is homeless and has not been able to find a shelter. He denies a plan and denies homicidal ideation. He denies any hx of suicidal attempt.

## 2016-07-09 NOTE — Progress Notes
-  Consult ETU for further evaluation   ??  AKI  - resolved   -Cr. 2.11 -> 1.6 -> 1.1  -monitor strict I/Os  -F/u urine lytes   ??  HTN  -140-160s  -d/c chlorthalidone 25   -add coreg 3.125 BID  ?  DM  -a1c 6  -ISS  ?  Disposition: Pt is currently homeless and speaking with CM regarding options. Awaiting ETU evaluation regarding suicidal ideation.   Prophylaxis: Eliquis   FEN/GI: replace all electrolytes PRN.  Cardiac diet.  Code status: Full     Gaspar ColaUnnati Wardan, DO    Internal Medicine PGY-1  07/08/2016 6:59 AM

## 2016-07-09 NOTE — Progress Notes
Skin: no rashes or lesions.   Psych: A & O X3.      Microbiology:   No results found for this visit on 07/06/16.    Labs:  Recent Labs      07/06/16   2012  07/07/16   0247  07/08/16   0346   WBC  5.36  5.24  4.58   HGB  11.1*  10.3*  11.1*   HCT  33.5*  31.7*  33.8*     Recent Labs      07/06/16   1837  07/07/16   0247  07/08/16   0346   NA  144  144  141   K  4.1  3.6  3.6   CL  103  104  102   CO2  25  24  25    BUN  35*  36*  28*   CREATININE  2.11*  1.77*  1.16   GLU  144*  128*  174*   MG   --   1.9  1.7*     No results for input(s): TROPONINT, CKMB, CKTOTAL in the last 72 hours.      Radiology:    Per Radiology: [place] Venous Doppler Lower Ext Bilateral  ?  Result Date: 07/06/2016  BILATERAL LOWER EXTREMITY DEEP VENOUS ULTRASOUND    HISTORY: [age] years Male Leg swelling      Technique: Real time ultrasound is used to examine the deep venous system of bilateral lower extremities. Gray scale with compression maneuvers, Color Doppler and Spectral Doppler at rest and with augmentation of the common femoral, profunda femoral, femoral and popliteal veins was performed bilaterally. Images were obtained and stored in a permanent archive. Comparison: None Result: Right lower extremity: Deep venous system: Common femoral vein: No thrombus is identified in the visualized portions of the common femoral  vein which demonstrates normal compressibility and phasic flow on pulsed Doppler evaluation. Femoral vein: Thrombus throughout the right femoral vein. Popliteal vein: No thrombus is identified in the visualized portions of the popliteal vein which demonstrates normal compressibility and phasic flow on pulsed Doppler evaluation. Superficial venous system: The visualized portions of the greater saphenous vein is patent without thrombus. Left lower extremity: Deep venous system: Common femoral vein: No thrombus is identified in the visualized portions of the common femoral  vein which

## 2016-07-09 NOTE — Plan of Care
Problem: Risk for Self Harm  Goal: Denies intent to harm self  Intervention: Assess risk for self harm  Pt denies ideation to hurt self. Pt states he was "just very frustrated" at the moment [facility] because [facility] feels [facility] are not [facility] his needs and nobody there gives him answers regarding his case. Pt expressed optimistic and self motivated thoughts regarding placement conversation with social worker earlier today. Pt laying in bed calmly, cooperative and in no distress.

## 2016-07-09 NOTE — Progress Notes
3. No regional wall motion abnormalities were noted.  4. The RV was not well visualized.  5. Unable to estimate RVSP due to inadequate TR jet spectral doppler profile.  Electronically Signed By: ?Ernest StakesSrinivasan Sattiraju MD  Electronically Signed On: 2016/07/07 12:53:08    Assessment & Plan:   Mr.?Ernest Price?is a 55 y.o.?male?with PMH significant for HTN, DM?who presented with leg pain. Admitted to medicine for DVT.  ??  Provoked DVT (second)  -possibly secondary to prolonged travel this week. No trauma.  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 Limb perfusion assured.  - c/w Eliquis, AKI has resolved  -ECHO with EF 60-65% with impaired relaxation   -f/u hypercoagulable workup as this is second event    Suicidal ideation   -Pt reported suicidal ideation 10/10 after being informed of possible discharge. He reports he feels sad and overwhelmed since he is homeless and has not been able to find a shelter. He denies a plan and denies homicidal ideation. He denies any hx of suicidal attempt.   -Consulted ETU for further evaluation   -No psychosis or agitated behaviour  -sitter ordered until cleared by psych      Patient is medically cleared is inpatient psychiatry deems transfer appropriate. Otherwise will be discharged directly.    ??  HTN  -140-160s  -d/c chlorthalidone 25   -started coreg 3.125 BID  ?  DM  -a1c 6  -ISS  ?  Disposition: Pt is currently homeless and speaking with CM regarding options. Awaiting ETU evaluation regarding suicidal ideation.   Prophylaxis: Eliquis   FEN/GI: replace all electrolytes PRN.  Cardiac diet.  Code status: Full     Adele SchilderSatish Maharaj, MD    Internal Medicine PGY-2  07/09/2016 6:34 AM

## 2016-07-09 NOTE — Progress Notes
Internal Medicine Team C  Progress Note    Date of Admission: 07/06/2016   LOS: 3 days     Subjective:     Overnight Ernest Price had no acute complaints. He was seen this morning in bed playing cards on his phone and enjoying it. He had a jovial mood and normal affect. Understands the treatment course and says he has a PCP visit scheduled for d/c.     Awaiting the pcyhiatry evaluation; upon hearing discharge plans yesterday he reported to the RN yesterday he had suicidal ideation but no plan. A sitter was ordered at bedside due to SI (pending psych eval) but the order was cancelled - order re-placed pending investigation on cacellation.    Patient is medically cleared is inpatient psychiatry deems transfer appropriate. Otherwise will be discharged directly.    Objective:   BP 168/89 - Pulse 61 - Temp 36.8 ?C (98.2 ?F) (Oral)  - Resp 18 - Ht 1.803 m (5\' 11" ) - Wt 94.2 kg (207 lb 10.8 oz) - SpO2 99% - BMI 28.96 kg/m2  Body mass index is 28.96 kg/(m^2).      Intake/Output Summary (Last 24 hours) at 07/09/16 0634  Last data filed at 07/08/16 1555   Gross per 24 hour   Intake              100 ml   Output                0 ml   Net              100 ml       SCHEDULED MEDS:  ? apixaban  10 mg Oral BID    Followed by   ? Melene Muller[START ON 07/15/2016] apixaban  5 mg Oral BID   ? aspirin  81 mg Oral daily   ? atorvastatin  40 mg Oral Nightly   ? carvedilol  3.125 mg Oral BID   ? DULoxetine  60 mg Oral Nightly   ? gabapentin  600 mg Oral TID   ? insulin aspart  0-3 Units Subcutaneous TID AC   ? insulin aspart  0-4 Units Subcutaneous Nightly     IV INFUSIONS:     PRN MEDS:    acetaminophen 650 mg Oral Q4H PRN   dextrose 30 mL Intravenous PRN   glucose 16 g Oral PRN   HYDROcodone-acetaminophen 1 tablet Oral Q6H PRN   mirtazapine 30 mg Oral Nightly PRN   perflutren lipid microspheres 0.2-1.3 mL Intravenous PRN       Physical Exam  Gen: NAD. Sitting up in bed on room air with no distress.   Head and Eyes: NCAT. EOMI. PERRL.

## 2016-07-09 NOTE — Progress Notes
vein: Thrombus throughout the right femoral vein. Popliteal vein: No thrombus is identified in the visualized portions of the popliteal vein which demonstrates normal compressibility and phasic flow on pulsed Doppler evaluation. Superficial venous system: The visualized portions of the greater saphenous vein is patent without thrombus. Left lower extremity: Deep venous system: Common femoral vein: No thrombus is identified in the visualized portions of the common femoral  vein which demonstrates normal compressibility and phasic flow on pulsed Doppler evaluation. Femoral vein: No thrombus is identified in the visualized portions of the femoral vein which demonstrates normal compressibility and phasic flow on pulsed Doppler evaluation. Popliteal vein: Thrombus is visualized within the left popliteal vein. Superficial venous system: The visualized portions of the greater saphenous vein is patent without thrombus.   ?  Impression: Thrombus within the right femoral vein and left popliteal vein. [name] was notified of the above findings by telephone on 07/06/2016 at 7:16 PM.  This individual acknowledged receipt of the information and repeated it back.   ?  Per Radiology: Xr Chest Single View  ?  Result Date: 07/06/2016  Study: XR CHEST SINGLE VIEW HISTORY: [age] years Male. INDICATION: SOB (shortness of breath). TECHNIQUE: XR CHEST SINGLE VIEW. COMPARISON: None. FINDINGS: The trachea is midline. The cardiomediastinal silhouette and pulmonary vasculature are within normal limits. No pneumothorax or pleural effusion is identified. No focal airspace opacity is seen. Partial visualization of thoracolumbar fusion hardware. Surgical clips are seen in the region of the gastroesophageal junction.   ?  No acute cardiopulmonary process.     TTE    CONCLUSIONS  -----------  1. Overall left ventricular systolic function is normal with, an EF between 60 - 65 %.  2. The diastolic filling pattern indicates impaired relaxation.

## 2016-07-09 NOTE — Progress Notes
Internal Medicine Team C  Progress Note    Date of Admission: 07/06/2016   LOS: 2 days     Subjective:     Overnight Ernest Price had no acute complaints. This morning was doing well. I was informed by RN later on that pt reported suicidal ideation.     Objective:   BP 139/73 - Pulse 78 - Temp 36.5 ?C (97.7 ?F) (Oral)  - Resp 18 - Ht 1.803 m (5\' 11" ) - Wt 94.2 kg (207 lb 10.8 oz) - SpO2 96% - BMI 28.96 kg/m2  Body mass index is 28.96 kg/(m^2).      Intake/Output Summary (Last 24 hours) at 07/08/16 0659  Last data filed at 07/07/16 2304   Gross per 24 hour   Intake              720 ml   Output             1825 ml   Net            -1105 ml       SCHEDULED MEDS:  ? atorvastatin  40 mg Oral Nightly   ? chlorthalidone  25 mg Oral daily   ? DULoxetine  60 mg Oral Nightly   ? gabapentin  600 mg Oral TID   ? insulin aspart  0-3 Units Subcutaneous TID AC   ? insulin aspart  0-4 Units Subcutaneous Nightly   ? magnesium sulfate  2 g Intravenous Once   ? polyethylene glycol  17 g Oral Once     IV INFUSIONS:  ? heparin (porcine) 14 Units/kg/hr (07/07/16 1850)     PRN MEDS:    acetaminophen 650 mg Oral Q4H PRN   dextrose 30 mL Intravenous PRN   glucose 16 g Oral PRN   heparin 5000 units/mL SEE ADMIN INSTRUCTIONS 40 Units/kg Intravenous PRN   heparin 5000 units/mL SEE ADMIN INSTRUCTIONS 80 Units/kg Intravenous PRN   HYDROcodone-acetaminophen 1 tablet Oral Q6H PRN   mirtazapine 30 mg Oral Nightly PRN   perflutren lipid microspheres 0.2-1.3 mL Intravenous PRN       Physical Exam  Gen: NAD. Sitting up in bed on room air.   Head and Eyes: NCAT. EOMI. PERRL.   ENT: MMM and pink. Normal dentition, lips, and gums. Normal oropharynx without erythema, edema or lesions.  Lymphatic:  no cervical  lympadenopathy.   CVS: RRR. Nl s1/s2. No murmur   Lungs: CTAB. No wheezing/rhonchi/rales  Abd: soft, nt, nd. positive bowel sounds.  Ext: DP/PT present symmetrically bilaterally.  lower extremity edema R>L. No discoloration present.

## 2016-07-09 NOTE — Plan of Care
Problem: Risk for Self Harm  Goal: Denies intent to harm self  Intervention: Assess risk for self harm  Pt denies ideation to hurt self. Pt states he was "just very frustrated" at the moment during [facility] because he feels they are not caring for his needs and nobody there gives him answers regarding his case. Pt expressed optimistic and self motivated thoughts regarding placement conversation with social worker earlier today. Pt laying in bed calmly, cooperative and in no distress. Pt listening to meditation music in bed.

## 2016-07-10 MED ORDER — CARVEDILOL 3.125 MG PO TABS
3.125 mg | Freq: Two times a day (BID) | ORAL | 2 refills | Status: CP
Start: 2016-07-10 — End: ?

## 2016-07-10 MED ORDER — ASPIRIN 81 MG PO CHEW
81 mg | Freq: Every day | ORAL | 2 refills | Status: CP
Start: 2016-07-10 — End: ?

## 2016-07-10 MED ORDER — APIXABAN 5 MG PO TABS
5 mg | Freq: Two times a day (BID) | ORAL | 3 refills | Status: CP
Start: 2016-07-10 — End: ?

## 2016-07-10 MED ORDER — APIXABAN 5 MG PO TABS
20 mg | Freq: Every day | ORAL | 0 refills | Status: CP
Start: 2016-07-10 — End: ?

## 2016-07-10 MED ORDER — APIXABAN 5 MG PO TABS
10 mg | Freq: Two times a day (BID) | ORAL | Status: CN
Start: 2016-07-10 — End: ?

## 2016-07-10 MED ORDER — ATORVASTATIN CALCIUM 40 MG PO TABS
40 mg | Freq: Every evening | ORAL | 2 refills | Status: CP
Start: 2016-07-10 — End: ?

## 2016-07-10 NOTE — Discharge Summary
PSTD who presented to the ED with right leg pain. Last week, he lost his house in [place]. He drove from [place] to [place] to live with his family. However, he [place] stay by the beach and so he drove from [place] to [place]. No known malignancy or history of PE. Lower extremity ultrasound was siginificant for right femoral and left popliteal DVT. Pt was started on heparin drip and switched to Eliquis 10 mg twice daily. Pt reported suicidal ideation after being informed of his discharge yesterday. He felt overwhelmed since he is currently living at shelters. He was evaluated by psychiatry and cleared by discharge. This morning, he denied suicidal or homicidal ideation. He was discharged later today and [facility]     Discharge Physical Exam:  Gen: NAD. Sitting up in bed on room air with no distress.   Head and Eyes: NCAT. EOMI. PERRL.   ENT: MMM and pink. Normal dentition, lips, and gums. Normal oropharynx without erythema, edema or lesions.  Lymphatic: ?no cervical ?lympadenopathy.   CVS: RRR. Nl s1/s2. No murmur   Lungs: CTAB. No wheezing/rhonchi/rales  Abd: soft, nt, nd. positive bowel sounds.  Ext: DP/PT present symmetrically bilaterally. ?lower extremity edema L>R which is diminishing, minimally tender to palpation. No discoloration present.   Skin: no rashes or lesions.   Neuro: A &?O X3. ?Gait normal. Normal tone, power and reflexes.  Psych - normal mood and affect with no s/s of psychosis    Discharge Medications:     Discharge Medications      Active Meds       Sig    * apixaban 5 MG Tabs   Commonly known as:  ELIQUIS    20 mg, Oral, daily   Quantity:  16 tablet       * apixaban 5 MG Tabs   Commonly known as:  ELIQUIS    5 mg, Oral, BID   Quantity:  60 tablet   Start taking on:  07/15/2016       aspirin 81 MG Chew    81 mg, Oral, daily   Quantity:  30 tablet       atorvastatin 40 MG Tabs   Commonly known as:  LIPITOR   Replaces:  simvastatin 40 MG Tabs    40 mg, Oral, Nightly

## 2016-07-10 NOTE — Progress Notes
Department of Case Management  Discharge Planning     Patient ID:   NAME:  Ernest Price  MRN: [id number]    AGE: 55 y.o.   DOB: 12-Oct-1960  Date of Admission: 07/06/2016      Address: [address]  [place] [id number]     Emergency Contact: Patient states that he does note have and emergency contact person.  Payor: Payor: MEDICARE / Plan: MEDICARE PART A AND B / Product Type: Government /     Medicare Important Message Provided:    Patient admitted with There is no problem list on file for this patient.      Patient has Capacity for Decision-Making: Yes  Prior Living Arrangements / Resides with: Patient is homeless. [place]   Patient Capacity for Self-Care / Level of Independence: Patient is independent with ADL'S and mobility  Caregiver / Support System: None per patient  Patient provided contact person:    Name: Contact Name: Estill Bambergodd Donley  Relationship: Contact Person Relationship to Patient: Brother  Phone #:    Advance Directive: Nonw.  Patient educated  Health Care Proxy: none  Therapy Recommendations:  Home Health Care, Durable Medical Equipment:   Community Resources:   Outpatient Case Manager:  Primary Care Physician: Patient, None Per   Pharmacy: No Pharmacies Listed  Social History:   Social History     Social History   ? Marital status: Single     Spouse name: N/A   ? Number of children: N/A   ? Years of education: N/A     Occupational History   ? Not on file.     Social History Main Topics   ? Smoking status: Never Smoker   ? Smokeless tobacco: Never Used   ? Alcohol use No   ? Drug use: Not on file   ? Sexual activity: Not on file     Other Topics Concern   ? Not on file     Social History Narrative   ? No narrative on file     Assessment and Discharge Plan:  Additional Information: [employer] CM reviewed chart for discharge readiness. CM received a call from Charge RN who stated she had a conversation with pt while he was in distress. Charge RN stated pt was on

## 2016-07-10 NOTE — Progress Notes
MD brought prescriptions to the floor, handing to RN. RN explained prescriptions to patient. Patient verbalized understanding. Will go over discharge paperwork after calling CM again in regards to discharge plan and transportation.

## 2016-07-10 NOTE — Progress Notes
Patient denies wanting to harm self, he said "I did not mean what I said yesterday, I am not going to hurt my self". Patient informed this nurse the psychiatry said they were going to d/c the sitter and asked if sitter was going to be d/c'd.   Psychiatry has seen patient and according to their note they have cleared him and psychiatrist Dr.Banday advised for sitter to be d/c'd and this nurse spoke with IM crosscover and informed him of psychiatries notes and asked if the sitter was going to be d/c'd tonight and he said "no, not tonight, I will get the primary team tomorrow". Patient informed that sitter is to remain in place and the physicians will re-evaluate tomorrow. Sitter at bedside, call light in reach, will continue to monitor.

## 2016-07-10 NOTE — Consults
?   Drug abuse    ? Hypertension    ? PTSD (post-traumatic stress disorder)    ? PTSD (post-traumatic stress disorder)        Allergies:  Review of patient's allergies indicates no known allergies.    Home Medications:  Prescriptions Prior to Admission   Medication Sig   ? chlorthalidone (HYGROTON) 25 MG Tablet Take by mouth daily.   ? cyanocobalamin (vitamin B12) 1000 MCG/ML Solution into the muscle.   ? DULoxetine (CYMBALTA) 60 MG Capsule Delayed Release Particles Take by mouth daily.   ? gabapentin (NEURONTIN) 600 MG Tablet Take by mouth 3 times daily.   ? ketoconazole (NIZORAL) 2 % Cream Apply topically daily.   ? metFORMIN (GLUCOPHAGE) 1000 MG Tablet Take by mouth 2 times daily (with meals). SrCr:   mg/dl   [last value]   ? mirtazapine (REMERON) 30 MG Tablet Take by mouth nightly at bedtime.   ? simvastatin (ZOCOR) 40 MG Tablet by mouth.   ? tamsulosin (FLOMAX) 0.4 MG Capsule Take by mouth daily.       Current Hospital Medications:  Scheduled:   ? apixaban  10 mg Oral BID    Followed by   ? [START ON 07/15/2016] apixaban  5 mg Oral BID   ? aspirin  81 mg Oral daily   ? atorvastatin  40 mg Oral Nightly   ? carvedilol  3.125 mg Oral BID   ? [START ON 07/10/2016] DULoxetine  60 mg Oral daily   ? gabapentin  600 mg Oral TID   ? insulin aspart  0-3 Units Subcutaneous TID AC   ? insulin aspart  0-4 Units Subcutaneous Nightly     Continuous Infusions:    PRN:   acetaminophen 650 mg Oral Q4H PRN   dextrose 30 mL Intravenous PRN   glucose 16 g Oral PRN   HYDROcodone-acetaminophen 1 tablet Oral Q6H PRN   mirtazapine 30 mg Oral Nightly PRN   perflutren lipid microspheres 0.2-1.3 mL Intravenous PRN       Family History:  Mental illness in family: none reported.  Substance abuse in family: Father was an alcoholic  Completed suicide in family: none reported.    Social History:  Married: once  Children: 1.  Living Situation: Homeless at present, [facility]  Level of Education: [employer]
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
## 2016-07-10 NOTE — Discharge Summary
Department of Medicine  Internal Medicine Team C  Discharge Summary    Date of service: 07/10/2016    Patient Name: Ernest Price  Patient MRN: [id number]  Patient DOB: 1960/11/14    Date of Admission: 07/06/2016  Date of Discharge:07/10/2016      Attending Physician: Dr. Ezekiel InaAung MD   Resident: Gaspar ColaUnnati Wardan, DO    Admission Diagnosis: DVT (deep venous thrombosis)     Discharge Diagnoses: DVT     Condition: Stable    Imaging:     Per Radiology: Koreas Venous Doppler Lower Ext Bilateral  ??  Result Date: 07/06/2016  BILATERAL LOWER EXTREMITY DEEP VENOUS ULTRASOUND ???HISTORY: [age] years Male Leg swelling ?????Technique: Real time ultrasound is used to examine the deep venous system of bilateral lower extremities. Gray scale with compression maneuvers, Color Doppler and Spectral Doppler at rest and with augmentation of the common femoral, profunda femoral, femoral and popliteal veins was performed bilaterally. Images were obtained and stored in a permanent archive. Comparison: None Result: Right lower extremity: Deep venous system: Common femoral vein: No thrombus is identified in the visualized portions of the common femoral ?vein which demonstrates normal compressibility and phasic flow on pulsed Doppler evaluation. Femoral vein: Thrombus throughout the right femoral vein. Popliteal vein: No thrombus is identified in the visualized portions of the popliteal vein which demonstrates normal compressibility and phasic flow on pulsed Doppler evaluation. Superficial venous system: The visualized portions of the greater saphenous vein is patent without thrombus. Left lower extremity: Deep venous system: Common femoral vein: No thrombus is identified in the visualized portions of the common femoral ?vein which demonstrates normal compressibility and phasic flow on pulsed Doppler evaluation. Femoral vein: No thrombus is identified in the visualized portions of the femoral vein which demonstrates normal compressibility and

## 2016-07-10 NOTE — Plan of Care
Problem: Pain, Acute & Chronic  Goal: Pain is relieved/acceptable level with minimal side effects  Intervention: Assess effectiveness of pain/therapeutic intervention and notify Provider of unrelieved pain  Patient medicated for R leg pain as needed per orders. Pt c/o pain score 7/10 to R leg. Per patient pain regimen is noted to be effective thus far. Will continue to monitor, will continue to monitor and tx for pain per orders. Patient has call light in reach.

## 2016-07-10 NOTE — Plan of Care
Problem: Discharge Planning  Goal: Safe effective discharge  Discharge instructions were discussed. IVs were removed. Follow up appointments, prescriptions, and diet restrictions were discussed. Patient gathered his personal belongings and has dressed in his personal clothers.

## 2016-07-10 NOTE — Progress Notes
Department of Case Management  Discharge Planning     Patient ID:   NAME:  Ernest Price  MRN: [id number]    AGE: 55 y.o.   DOB: 1961-08-17  Date of Admission: 07/06/2016      Address: [address]  [place] [address]     Emergency Contact: Patient states that he does note have and emergency contact person.  Payor: Payor: MEDICARE / Plan: MEDICARE PART A AND B / Product Type: Government /     Medicare Important Message Provided:    Patient admitted with There is no problem list on file for this patient.      Patient has Capacity for Decision-Making: Yes  Prior Living Arrangements / Resides with: Patient is homeless. Drove from [place] to [facility]   Patient Capacity for Self-Care / Level of Independence: Patient is independent with ADL'S and mobility  Caregiver / Support System: None per patient  Patient provided contact person:    Name: Contact Name: Nicolette Bang  Relationship: Contact Person Relationship to Patient: Brother  Phone #:    Advance Directive: Nonw.  Patient educated  Health Care Proxy: none  Therapy Recommendations:  [place], Durable Medical Equipment:   Community Resources:   Outpatient Case Manager:  Primary Care Physician: Patient, None Per   Pharmacy: No Pharmacies Listed  Social History:   Social History     Social History   ? Marital status: Single     Spouse name: N/A   ? Number of children: N/A   ? Years of education: N/A     Occupational History   ? Not on file.     Social History Main Topics   ? Smoking status: Never Smoker   ? Smokeless tobacco: Never Used   ? Alcohol use No   ? Drug use: Not on file   ? Sexual activity: Not on file     Other Topics Concern   ? Not on file     Social History Narrative   ? No narrative on file     Assessment and Discharge Plan:  Additional Information: Met with patient. Introduced self and explained CM role in safe discharge planning. Discharge planning assessment completed.

## 2016-07-10 NOTE — Progress Notes
[facility] but information placed on the DC instructing sheet. 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   ?  Discharge Plan: Patient will discharge to area shelter  Discharge Pending: Labs WNL  Expected Discharge Date:    Transportation/Contact Person at Discharge: CM will assist  Jamison OkaSandra W Jones, RN   07/07/2016 5:39 PM
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
## 2016-07-10 NOTE — Plan of Care
Problem: Risk for Injury R/T Falls  Goal: Prevent falls  Patient in bed with two side rails up, wheels locked, in the lowest position with the call light within reach. AxOx4. Nonskid footwear on. RN medicated per MAR. Will continue to monitor and treat PRN.     Problem: Glycemic Control  Goal: Glucose levels within prescribed parameters  Patient's glucose level 93 mg/dL. RN did not administer insulin per sliding scale per MAR. Will continue to monitor and treat PRN.

## 2016-07-10 NOTE — Progress Notes
10/112/17 2:21 pm Patient [facility] and is listed on [facility] AV[facility] for reference. Patients follow up appt is on  October 19 at 10:30am .  Curtis Siteseltra Davis, Care Coordinator

## 2016-07-10 NOTE — Progress Notes
RN paged MD for the second time per CM request in regards to discharge plan and prescriptions. Waiting for call back.

## 2016-07-10 NOTE — Consults
Occupation: Retired from [employer] in [employer], [place] [facility]  Legal History: none reported.    Substance Abuse History:  Tobacco: none reported.  Alcohol: quit 4 months ago  Cannabis: none reported.  Cocaine: none reported.  Opioids: none reported.  Benzodiazepines: none reported.  The patient denies the use of any other substance of abuse.   History of drug rehabilitation or detoxification: none reported.    I have reviewed the past medical, surgical, family and social history.    Review of systems:    Constitutional: denies fatigue, fever.  HEENT: denies rhinorrhea, sore throat.  Respiratory: denies cough, shortness of breath, wheezing.  Cardiovascular: denies chest pain, palpitations.  GI: denies N/V/C/D, abdominal pain.  MSK: denies joint pain, muscle stiffness/soreness, back pain, neck pain.  Neuro: denies HA, dizziness.  Psychiatric: as above and below.    Objective:     Patient Vitals for the past 12 hrs:   BP Temp Temp src Pulse Resp SpO2   07/09/16 1139 173/89 36.9 ?C (98.4 ?F) Oral 69 18 98 %   07/09/16 0735 157/81 37.1 ?C (98.7 ?F) Oral 68 18 98 %   07/09/16 0326 168/89 36.8 ?C (98.2 ?F) Oral 61 18 99 %     body mass index is 28.96 kg/(m^2).    Mental Status Exam:  General/Appearance: 55 y.o. African American male who appears stated age.  Behavior: cooperative.  Eye Contact: appropriate.  Psychomotor: without apparent psychomotor agitation or retardation, no abnormal movements noted.  Musculoskeletal: ambulates without difficulty  Speech/Language: fluent.  Thought process: linear, coherent, goal directed, with no loose associations.  Thought content: denies any hallucinations, delusions, thought insertion or broadcasting, ideas of reference, suicidal or homicidal ideations.  Judgment: fair.  Insight: fair.  Orientation: person, place, situation, date, day of week, month, year.  Memory: remote and recent memory grossly intact.  Attention/Concentration: average.  Fund of knowledge: average.  Mood: ok.

## 2016-07-10 NOTE — Progress Notes
the phone with [facility] Crisis Hot[facility] when she walked in the room. Charge RN spoke to [facility] staff who stated pt threatened to commit suicide. However, pt informed Charge RN that he does not plan to commit suicide. Pt stated he's concerned because he has nowhere to go post discharge. Pt is homeless, and needs to get his shelter card [facility] to [facility] CM discussed medical respite program with pt. Pt was in agreement. CM contacted Ernest Price with [facility] at [phone] to discuss. Ernest Price stated pt sounds like he might be appropriate for their program. Ernest Price said she is willing to review pt's information and assess appropriateness. CM to fax info to [phone] for review. Ernest PanningAnchetta R Sutton, LCSW     ?  Discharge Plan: Patient will discharge to area shelter  Discharge Pending: Labs WNL  Expected Discharge Date:    Transportation/Contact Person at Discharge: CM will assist  Ernest OkaSandra W Jones, RN   07/07/2016 5:39 PM

## 2016-07-10 NOTE — Discharge Summary
No results found for: CHOL, TRIG, HDL, LDL    No results found for: TSH, FREET4, T3FREE    Lab Results   Component Value Date/Time    HGBA1C 6.0 (H) 07/06/2016 08:12 PM         Discharge Instructions:   1. [facility]   2. Please take 10 mg eliquis twice daily until 07/14/2016  3. Please take 5 mg eliquis twice daily starting 07/15/2016 until 10/16/2015    Discharge Orders  Diet Cardiac (Fat, Chol, Sod, Restricted)     For patients with heart or kidney problems: Measure weight daily   If I have heart or kidney problems, I will record my daily weight and call my physician to report a gain of 3 lbs or more in 5 days.       Activity as tolerated     Symptoms to monitor after discharge   For any questions or concerns and to report any of the following symptoms/problems: Worsening of current symptoms, Chest pain, Shortness of breath, Dizziness, Swelling in ankles or legs, Pain uncontrolled by medication, Drainage or foul odor from incision, Extensive bruising or discoloration, Nausea/vomiting, Elevated temperature. Notify your physician.     Smoking cessation   If you smoke, stop.  If you don't smoke, do not start.  Avoid second hand smoke.  For help call the toll-free QuitLine at ([phone].     WARFARIN (Coumadin) Education is NOT Required     The orders/medications listed here were reviewed, ordered and electronically signed by:   Gaspar ColaUnnati Wardan, DO       Gaspar ColaUnnati Wardan, DO   Internal Medicine PGY-1  07/10/2016  3:42 PM

## 2016-07-10 NOTE — Discharge Summary
Quantity:  30 tablet       carvedilol 3.125 MG Tabs   Commonly known as:  COREG    3.125 mg, Oral, BID   Quantity:  60 tablet       * Notice:  This list has 2 medication(s) that are the same as other medications prescribed for you. Read the directions carefully, and ask your doctor or other care provider to review them with you.      Continued Prior to Admission Meds       Sig    chlorthalidone 25 MG Tabs   Commonly known as:  HYGROTON    Oral, daily       cyanocobalamin (vitamin B12) 1000 MCG/ML Soln    Intramuscular       DULoxetine 60 MG Cpep   Commonly known as:  CYMBALTA    Oral, daily       gabapentin 600 MG Tabs   Commonly known as:  NEURONTIN    Oral, TID       ketoconazole 2 % Crea   Commonly known as:  NIZORAL    Topical, daily       metFORMIN 1000 MG Tabs   Commonly known as:  GLUCOPHAGE    Oral, BID W/ Meals, SrCr:   mg/dl   [last value]        mirtazapine 30 MG Tabs   Commonly known as:  REMERON    Oral, Nightly       tamsulosin 0.4 MG Caps   Commonly known as:  FLOMAX    Oral, daily         Stopped Meds          simvastatin 40 MG Tabs   Commonly known as:  ZOCOR   Replaced by:  atorvastatin 40 MG Tabs             Discharge Labs:  Recent Labs      07/08/16   0346  07/09/16   0519  07/10/16   0432   WBC  4.58  3.81*  4.70   HGB  11.1*  10.9*  11.1*   HCT  33.8*  33.2*  34.2*   MCV  82.2  81.8*  83.2   NEUTROPCT  48.9  45.4  45.8       Recent Labs      07/08/16   0346  07/09/16   0519  07/10/16   0432   NA  141  141  142   K  3.6  4.0  4.3   CL  102  102  102   CO2  25  26  28    BUN  28*  29*  28*   CREATININE  1.16  1.32*  1.50*   GLU  174*  88  82   CALCIUM  8.5*  9.1  9.1   MG  1.7*  2.1  2.2       No results for input(s): ALB, ALKPHOS, ALT, AST, DBILI, TBILI, TPROT in the last 72 hours.    Invalid input(s): TP    No results for input(s): TROPONINT, CKMB, CKTOTAL in the last 72 hours.    Recent Labs      07/08/16   0346  07/09/16   0519  07/10/16   0432   INR  0.9  1.0  1.0

## 2016-07-10 NOTE — Consults
Depression:  Patient endorses depressed mood, anhedonia, feelings of worthlessness or guilt, loss of energy, significant changes in weight or appetite, decreased concentration, difficulty with sleep, and suicidal or homicidal ideation.    Mania:  Patient denies distinct periods of elevated or irritable mood with persistently increased goal directed activity or energy, grandiosity, decreased need for sleep, pressured speech, racing thoughts, and increased risk taking behaviors.    Psychosis:  Patient denies auditory hallucinations, visual hallucinations, thought insertion, thought broadcasting, ideas of reference, paranoid ideations, or delusions.    Anxiety/Panic:  Patient denies feelings of restlessness, being easily fatigued, difficulty concentrating, irritability, muscle tension, and sleep disturbance. Denies panic attacks with intense fear or discomfort, and palpitation or chest pain, sweating, trembling, shortness of breath, nausea, lightheadedness, chills, paresthesias, fear or losing control or dying.    Post Traumatic Stress Disorder:  Patient endorses  experiencing a traumatic event with intrusive thoughts, recurrent distressing dreams, flashbacks, avoidance, hypervigilance, sleep disturbance.    Eating Patterns:  Patient denies purposeful restricting or binging/purging to obtain desired appearance.  Denies excessive concerns about weight or appearance.    Psychiatric History   Inpatient psychiatric hospitalizations: none reported.  Previous outpatient psychiatric treatment: none reported.  Prior psychiatric medication trials: Zoloft and Remeron  Current psychiatric medication: was started on Cymbalta 60 mg nightly in the hospital  History of suicide attempts: none reported.  Self injurious behaviors: none reported.  History of violence or abuse: none reported.    Past Medical History:   Past Medical History:   Diagnosis Date   ? Anxiety    ? Anxiety    ? Depressed    ? Depression    ? Diabetes mellitus
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
## 2016-07-10 NOTE — Progress Notes
the phone with [facility] when she walked in the room. Charge RN spoke to [facility] staff who stated pt threatened to commit suicide. However, pt informed Charge RN that he does not plan to commit suicide. Pt stated he's concerned because he has nowhere to go post discharge. Pt is homeless, and needs to get his shelter card to go [facility]. CM discussed medical respite program with pt. Pt was in agreement. CM contacted [name] with [facility] at [phone] to discuss. [name] stated pt sounds like he might be appropriate for their program. [name] said she is willing to review pt's information and assess appropriateness. CM to fax info to [phone] for review. Marcy PanningAnchetta R Sutton, LCSW     ?  Discharge Plan: Call placed yesterday to  [facility] at [facility].  The patient is accepted for admission on Thursday 07/10/2016.  The patient will need medications and arrive before 5PM.  Patient made aware and is in agreement with disposition.  The cross cover CM made aware and verbalized understanding.  Discharge Pending: Discharge order  Expected Discharge Date:    Transportation/Contact Person at Discharge: CM will assist  Jamison OkaSandra W Jones, RN

## 2016-07-11 NOTE — Progress Notes
Internal Medicine Team C  Progress Note    Date of Admission: 07/06/2016   LOS: 4 days     Subjective:     Overnight no acute events. This morning, pt was in good mood. He denied suicidal or homicidal ideation today. He was cleared by Psychiatry yesterday.     Objective:   BP 163/85 - Pulse 70 - Temp 36.6 ?C (97.8 ?F) (Oral)  - Resp 18 - Ht 1.803 m (5\' 11" ) - Wt 94.2 kg (207 lb 10.8 oz) - SpO2 96% - BMI 28.96 kg/m2  Body mass index is 28.96 kg/(m^2).      Intake/Output Summary (Last 24 hours) at 07/10/16 16100712  Last data filed at 07/10/16 0600   Gross per 24 hour   Intake             1240 ml   Output                0 ml   Net             1240 ml       SCHEDULED MEDS:  ? apixaban  10 mg Oral BID    Followed by   ? Melene Muller[START ON 07/15/2016] apixaban  5 mg Oral BID   ? aspirin  81 mg Oral daily   ? atorvastatin  40 mg Oral Nightly   ? carvedilol  3.125 mg Oral BID   ? chlorthalidone  25 mg Oral daily   ? DULoxetine  60 mg Oral daily   ? gabapentin  600 mg Oral TID   ? insulin aspart  0-3 Units Subcutaneous TID AC   ? insulin aspart  0-4 Units Subcutaneous Nightly     IV INFUSIONS:     PRN MEDS:    acetaminophen 650 mg Oral Q4H PRN   dextrose 30 mL Intravenous PRN   glucose 16 g Oral PRN   HYDROcodone-acetaminophen 1 tablet Oral Q6H PRN   mirtazapine 30 mg Oral Nightly PRN       Physical Exam  Gen: NAD. Sitting up in bed on room air with no distress.   Head and Eyes: NCAT. EOMI. PERRL.   ENT: MMM and pink. Normal dentition, lips, and gums. Normal oropharynx without erythema, edema or lesions.  Lymphatic:  no cervical  lympadenopathy.   CVS: RRR. Nl s1/s2. No murmur   Lungs: CTAB. No wheezing/rhonchi/rales  Abd: soft, nt, nd. positive bowel sounds.  Ext: DP/PT present symmetrically bilaterally.  lower extremity edema L>R which is diminishing, minimally tender to palpation. No discoloration present.   Skin: no rashes or lesions.   Neuro: A & O X3.  Gait normal. Normal tone, power and reflexes.
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
## 2016-07-11 NOTE — Progress Notes
Psych - normal mood and affect with no s/s of psychosis    Microbiology:   No results found for this visit on 07/06/16.    Labs:  Recent Labs      07/08/16   0346  07/09/16   0519  07/10/16   0432   WBC  4.58  3.81*  4.70   HGB  11.1*  10.9*  11.1*   HCT  33.8*  33.2*  34.2*     Recent Labs      07/08/16   0346  07/09/16   0519  07/10/16   0432   NA  141  141  142   K  3.6  4.0  4.3   CL  102  102  102   CO2  25  26  28    BUN  28*  29*  28*   CREATININE  1.16  1.32*  1.50*   GLU  174*  88  82   MG  1.7*  2.1  2.2     No results for input(s): TROPONINT, CKMB, CKTOTAL in the last 72 hours.      Radiology:    Per Radiology: [place] Venous Doppler Lower Ext Bilateral  ?  Result Date: 07/06/2016  BILATERAL LOWER EXTREMITY DEEP VENOUS ULTRASOUND    HISTORY: [age] years Male Leg swelling      Technique: Real time ultrasound is used to examine the deep venous system of bilateral lower extremities. Gray scale with compression maneuvers, Color Doppler and Spectral Doppler at rest and with augmentation of the common femoral, profunda femoral, femoral and popliteal veins was performed bilaterally. Images were obtained and stored in a permanent archive. Comparison: None Result: Right lower extremity: Deep venous system: Common femoral vein: No thrombus is identified in the visualized portions of the common femoral  vein which demonstrates normal compressibility and phasic flow on pulsed Doppler evaluation. Femoral vein: Thrombus throughout the right femoral vein. Popliteal vein: No thrombus is identified in the visualized portions of the popliteal vein which demonstrates normal compressibility and phasic flow on pulsed Doppler evaluation. Superficial venous system: The visualized portions of the greater saphenous vein is patent without thrombus. Left lower extremity: Deep venous system: Common femoral vein: No thrombus is identified in the visualized portions of the common femoral  vein which

## 2016-07-11 NOTE — Progress Notes
demonstrates normal compressibility and phasic flow on pulsed Doppler evaluation. Femoral vein: No thrombus is identified in the visualized portions of the femoral vein which demonstrates normal compressibility and phasic flow on pulsed Doppler evaluation. Popliteal vein: Thrombus is visualized within the left popliteal vein. Superficial venous system: The visualized portions of the greater saphenous vein is patent without thrombus.   ?  Impression: Thrombus within the right femoral vein and left popliteal vein. [name] was notified of the above findings by telephone on 07/06/2016 at 7:16 PM.  This individual acknowledged receipt of the information and repeated it back.   ?  Per Radiology: Xr Chest Single View  ?  Result Date: 07/06/2016  Study: XR CHEST SINGLE VIEW HISTORY: [age] years Male. INDICATION: SOB (shortness of breath). TECHNIQUE: XR CHEST SINGLE VIEW. COMPARISON: None. FINDINGS: The trachea is midline. The cardiomediastinal silhouette and pulmonary vasculature are within normal limits. No pneumothorax or pleural effusion is identified. No focal airspace opacity is seen. Partial visualization of thoracolumbar fusion hardware. Surgical clips are seen in the region of the gastroesophageal junction.   ?  No acute cardiopulmonary process.     TTE    CONCLUSIONS  -----------  1. Overall left ventricular systolic function is normal with, an EF between 60 - 65 %.  2. The diastolic filling pattern indicates impaired relaxation.  3. No regional wall motion abnormalities were noted.  4. The RV was not well visualized.  5. Unable to estimate RVSP due to inadequate TR jet spectral doppler profile.  Electronically Signed By: ?Floyce StakesSrinivasan Sattiraju MD  Electronically Signed On: 2016/07/07 12:53:08    Assessment & Plan:   Mr.?Ernest Price?is a 55 y.o.?male?with PMH significant for HTN, DM?who presented with leg pain. Admitted to medicine for DVT.  ??  Provoked DVT (second)

## 2016-07-12 NOTE — ED Attestation Note
I  have reviewed the images with the resident during the limited lower extremity venous ultrasound examination as indicated for extremity pain. The ultrasound imaging protocol was performed as listed in the resident procedure note. I have discussed the findings with the resident and agree with the findings documented in the resident's note.  The final interpretation of those findings is/are lower extemity venous DVT present at bilateral lower extremities.         Sherri SearParsons, Melissa E, MD  07/11/16 2255

## 2016-07-30 ENCOUNTER — Inpatient Hospital Stay: Admit: 2016-07-30 | Discharge: 2016-07-30

## 2016-07-30 DIAGNOSIS — F1910 Other psychoactive substance abuse, uncomplicated: Secondary | ICD-10-CM

## 2016-07-30 DIAGNOSIS — F419 Anxiety disorder, unspecified: Secondary | ICD-10-CM

## 2016-07-30 DIAGNOSIS — Z538 Procedure and treatment not carried out for other reasons: Principal | ICD-10-CM

## 2016-07-30 DIAGNOSIS — I10 Essential (primary) hypertension: Secondary | ICD-10-CM

## 2016-07-30 DIAGNOSIS — F4310 Post-traumatic stress disorder, unspecified: Secondary | ICD-10-CM

## 2016-07-30 DIAGNOSIS — F329 Major depressive disorder, single episode, unspecified: Secondary | ICD-10-CM

## 2016-07-30 DIAGNOSIS — E119 Type 2 diabetes mellitus without complications: Secondary | ICD-10-CM
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
## 2016-07-31 ENCOUNTER — Inpatient Hospital Stay: Admit: 2016-07-31 | Discharge: 2016-07-31

## 2016-07-31 DIAGNOSIS — F4310 Post-traumatic stress disorder, unspecified: Secondary | ICD-10-CM

## 2016-07-31 DIAGNOSIS — I10 Essential (primary) hypertension: Secondary | ICD-10-CM

## 2016-07-31 DIAGNOSIS — E119 Type 2 diabetes mellitus without complications: Secondary | ICD-10-CM

## 2016-07-31 DIAGNOSIS — I82403 Acute embolism and thrombosis of unspecified deep veins of lower extremity, bilateral: Secondary | ICD-10-CM

## 2016-07-31 DIAGNOSIS — F418 Other specified anxiety disorders: Secondary | ICD-10-CM

## 2016-07-31 DIAGNOSIS — I82401 Acute embolism and thrombosis of unspecified deep veins of right lower extremity: Secondary | ICD-10-CM

## 2016-07-31 DIAGNOSIS — Z7901 Long term (current) use of anticoagulants: Secondary | ICD-10-CM

## 2016-07-31 DIAGNOSIS — F329 Major depressive disorder, single episode, unspecified: Secondary | ICD-10-CM

## 2016-07-31 DIAGNOSIS — F1910 Other psychoactive substance abuse, uncomplicated: Secondary | ICD-10-CM

## 2016-07-31 DIAGNOSIS — Z7982 Long term (current) use of aspirin: Secondary | ICD-10-CM

## 2016-07-31 DIAGNOSIS — I82402 Acute embolism and thrombosis of unspecified deep veins of left lower extremity: Principal | ICD-10-CM

## 2016-07-31 DIAGNOSIS — E785 Hyperlipidemia, unspecified: Secondary | ICD-10-CM

## 2016-07-31 DIAGNOSIS — L03114 Cellulitis of left upper limb: Secondary | ICD-10-CM

## 2016-07-31 DIAGNOSIS — M7989 Other specified soft tissue disorders: Secondary | ICD-10-CM

## 2016-07-31 DIAGNOSIS — Z79899 Other long term (current) drug therapy: Secondary | ICD-10-CM

## 2016-07-31 DIAGNOSIS — F419 Anxiety disorder, unspecified: Secondary | ICD-10-CM

## 2016-07-31 DIAGNOSIS — Z7984 Long term (current) use of oral hypoglycemic drugs: Secondary | ICD-10-CM

## 2016-07-31 DIAGNOSIS — L03116 Cellulitis of left lower limb: Secondary | ICD-10-CM | POA: Diagnosis not present

## 2016-07-31 MED ORDER — CEPHALEXIN 500 MG PO CAPS
500 mg | Freq: Four times a day (QID) | ORAL | 0 refills | Status: CP
Start: 2016-07-31 — End: ?

## 2016-07-31 MED ORDER — HYDROCODONE-ACETAMINOPHEN 10-325 MG PO TABS
1 | ORAL_TABLET | Freq: Four times a day (QID) | ORAL | 0 refills | Status: CP | PRN
Start: 2016-07-31 — End: ?

## 2016-07-31 NOTE — ED Provider Notes
No family history on file.    Social History     Social History   ? Marital status: Single     Spouse name: N/A   ? Number of children: N/A   ? Years of education: N/A     Social History Main Topics   ? Smoking status: Never Smoker   ? Smokeless tobacco: Never Used   ? Alcohol use No   ? Drug use: None   ? Sexual activity: Not Asked     Other Topics Concern   ? None     Social History Narrative       Review of Systems    Physical Exam     ED Triage Vitals   BP 07/31/16 0902 148/90   Pulse 07/31/16 0902 78   Resp 07/31/16 0902 16   Temp 07/31/16 0902 37 ?C (98.6 ?F)   Temp src 07/31/16 0902 Oral   Height 07/31/16 0902 1.778 m   Weight 07/31/16 0902 99.8 kg   SpO2 07/31/16 0902 98 %   BMI (Calculated) 07/31/16 0902 31.63             Physical Exam    Differential DDx: ***    Is this an Emergent Medical Condition? {SH ED EMERGENT MEDICAL CONDITION:[phone]}  409.901 FS  641.19 FS  627.732 (16) FS    ED Workup   Procedures    Labs:  -   POCT GLUCOSE - Normal       Result Value Ref Range    Glucose (Meter) 84  60.0 - 99.0 mg/dL   POCT GLUCOSE         Imaging (Read by ED Provider):  {Imaging findings:[phone]}    EKG (Read by ED Provider):  {EKG findings:[phone]}    MDM    ED Course & Re-Evaluation     ED Course         ED Disposition   ED Disposition: No ED Disposition Set      ED Clinical Impression   ED Clinical Impression:   No Clinical Impression Set      ED Patient Status   Patient Status:   {SH ED [facility] PATIENT STATUS:[phone]}        ED Medical Evaluation Initiated   Medical Evaluation Initiated:   Yes, filed at 07/31/16 1104  by Janora NorlanderJiang, Robert, MD

## 2016-07-31 NOTE — ED Provider Notes
History     Chief Complaint   Patient presents with   ? Leg Swelling       HPI Comments: Ernest Price is a 55 y.o. male  has a past medical history of Anxiety; Anxiety; Depressed; Depression; Diabetes mellitus; Drug abuse; Hypertension; PTSD (post-traumatic stress disorder); and PTSD (post-traumatic stress disorder)., coming in for Leg Swelling.  Patient states that pt has been having leg swelling since April, diagnosed with DVT recurrent on 07/06/2016.  Pt then experienced 3-4 days of worsening swelling and redness to the area.  Pt concerned that pain has not improved. compliatn with Eliquis  Patient is a 55 y.o. male presenting with Leg Swelling. The history is provided by the patient.   Leg Swelling   Location:  Leg  Leg location:  L leg, R leg, L lower leg and R lower leg  Pain details:     Quality:  Aching    Severity:  Moderate    Onset quality:  Gradual    Timing:  Constant  Chronicity:  New  Dislocation: no    Foreign body present:  No foreign bodies  Prior injury to area:  No  Relieved by:  Nothing  Worsened by:  Nothing  Associated symptoms: no decreased ROM and no fever        No Known Allergies    Discharge Medication List as of 07/31/2016 11:44 AM      START taking these medications    Details   cephALEXin (KEFLEX) 500 MG Capsule Take 1 capsule by mouth 4 times daily for 10 days.Disp-40 capsule, R-0, Normal      HYDROcodone-acetaminophen (NORCO) 10-325 MG Tablet Take 1 tablet by mouth every 6 hours as needed for pain.Disp-17 tablet, R-0, Normal         CONTINUE these medications which have NOT CHANGED    Details   apixaban (ELIQUIS) 5 MG Tablet Take 1 tablet by mouth 2 times daily.Disp-60 tablet, R-3, Normal      aspirin 81 MG Tablet Chewable Chew 1 tablet daily for 90 days.Disp-30 tablet, R-2, Normal      atorvastatin (LIPITOR) 40 MG Tablet Take 1 tablet by mouth nightly at bedtime for 90 days.Disp-30 tablet, R-2, Normal      carvedilol (COREG) 3.125 MG Tablet Take 1 tablet by mouth 2 times daily

## 2016-07-31 NOTE — ED Provider Notes
Musculoskeletal: Positive for myalgias and gait problem. Negative for joint swelling and arthralgias.   Skin: Negative for rash.   Neurological: Negative for numbness and headaches.       Physical Exam       ED Triage Vitals   BP 07/31/16 0902 148/90   Pulse 07/31/16 0902 78   Resp 07/31/16 0902 16   Temp 07/31/16 0902 37 ?C (98.6 ?F)   Temp src 07/31/16 0902 Oral   Height 07/31/16 0902 1.778 m   Weight 07/31/16 0902 99.8 kg   SpO2 07/31/16 0902 98 %   BMI (Calculated) 07/31/16 0902 31.63             Physical Exam   Constitutional: He is oriented to person, place, and time. He appears well-developed.   HENT:   Head: Normocephalic and atraumatic.   Right Ear: External ear normal.   Left Ear: External ear normal.   Eyes: Conjunctivae and EOM are normal. Pupils are equal, round, and reactive to light. Right eye exhibits no discharge. Left eye exhibits no discharge. No scleral icterus.   Neck: Normal range of motion.   Cardiovascular: Normal rate, regular rhythm, normal heart sounds and intact distal pulses.  Exam reveals no gallop and no friction rub.    No murmur heard.  Pulmonary/Chest: Effort normal and breath sounds normal. No respiratory distress. He has no wheezes. He has no rales. He exhibits no tenderness.   Abdominal: Soft. Bowel sounds are normal. He exhibits no distension and no mass. There is no tenderness. There is no rebound and no guarding.   Musculoskeletal: Normal range of motion.   B/l lower extremity edema,  Pitting.  Region of medial leg with some color chagnes.  Pulses intact. PT ambulatory, strength, ROM itnact throughout.   Neurological: He is alert and oriented to person, place, and time. No cranial nerve deficit.   Skin: Skin is warm and dry. He is not diaphoretic.   Nursing note and vitals reviewed.      Differential DDx: DVT, Cellulitis, muscle strain, ando thesr     Is this an Emergent Medical Condition? Yes - Severe Pain/Acute Onset of Symptons  409.901 FS  641.19 FS  627.732 (16) FS

## 2016-07-31 NOTE — ED Notes
Patient is resting comfortably.

## 2016-07-31 NOTE — ED Notes
Time of discharge: 1235  PM., Patient discharged to  Home.  Patient discharged  ambulatory. to exit with belongings in  Improved condition.  Patient escorted by  no one., Written discharge instructions given to  patient.  Patient/recipient  verbalizes discharge instructions.

## 2016-07-31 NOTE — ED Attestation Note
Attestation   I have made corrections and additions as appropriate. I discussed this patient with the resident/fellow.   Comments: Ernest PiggJohn E Stimler, DO 11:20 AM 07/31/2016  Patient seen primarily by me. Bilateral leg swelling from a DVT. Start of left medial distal leg. The area is not hot to touch and looks like some abrasions over it but may have some induration and may be early cellulitis. Lungs cta in all fields. Plan: to do quick US to check for cobblestoning. Plan to start on Keflex 500 mg bid for seven days, warm moist compresses and continue on medication for the DVT that is chronic on bilateral legs. Please refer to resident's note for results of [place] and verification of treatment and disposition.                Price, Balinda QuailsJohn E, DO  07/31/16 1124

## 2016-07-31 NOTE — ED Notes
Patient states bilateral leg swelling since April 2017.  States he has been diagnosed with blood clots in his legs.  States numbness in both feet. Uses a cane to ambulate.  Denies chest pain or shortness of breath.

## 2016-07-31 NOTE — ED Provider Notes
History     Chief Complaint   Patient presents with   ? Leg Swelling       HPI Comments: Ernest Price is a 55 y.o. male  has a past medical history of Anxiety; Anxiety; Depressed; Depression; Diabetes mellitus; Drug abuse; Hypertension; PTSD (post-traumatic stress disorder); and PTSD (post-traumatic stress disorder)., coming in for Leg Swelling.  Patient states that pt has been having leg swelling since April, diagnosed with DVT recurrent on 07/06/2016.  Pt then experienced 3-4 days of worsening swelling and redness to the area.          No Known Allergies    Patient's Medications   New Prescriptions    No medications on file   Previous Medications    APIXABAN (ELIQUIS) 5 MG TABLET    Take 1 tablet by mouth 2 times daily.    ASPIRIN 81 MG TABLET CHEWABLE    Chew 1 tablet daily for 90 days.    ATORVASTATIN (LIPITOR) 40 MG TABLET    Take 1 tablet by mouth nightly at bedtime for 90 days.    CARVEDILOL (COREG) 3.125 MG TABLET    Take 1 tablet by mouth 2 times daily for 90 days.    CHLORTHALIDONE (HYGROTON) 25 MG TABLET    Take by mouth daily.    CYANOCOBALAMIN (VITAMIN B12) 1000 MCG/ML SOLUTION    into the muscle.    DULOXETINE (CYMBALTA) 60 MG CAPSULE DELAYED RELEASE PARTICLES    Take by mouth daily.    GABAPENTIN (NEURONTIN) 600 MG TABLET    Take by mouth 3 times daily.    KETOCONAZOLE (NIZORAL) 2 % CREAM    Apply topically daily.    METFORMIN (GLUCOPHAGE) 1000 MG TABLET    Take by mouth 2 times daily (with meals). SrCr:   mg/dl   [last value]    MIRTAZAPINE (REMERON) 30 MG TABLET    Take by mouth nightly at bedtime.    TAMSULOSIN (FLOMAX) 0.4 MG CAPSULE    Take by mouth daily.   Modified Medications    No medications on file   Discontinued Medications    No medications on file       Past Medical History:   Diagnosis Date   ? Anxiety    ? Anxiety    ? Depressed    ? Depression    ? Diabetes mellitus    ? Drug abuse    ? Hypertension    ? PTSD (post-traumatic stress disorder)

## 2016-07-31 NOTE — ED Provider Notes
History     Chief Complaint   Patient presents with   ? Leg Swelling       HPI Comments: Freddi Starrhomas Slight is a 55 y.o. male  has a past medical history of Anxiety; Anxiety; Depressed; Depression; Diabetes mellitus; Drug abuse; Hypertension; PTSD (post-traumatic stress disorder); and PTSD (post-traumatic stress disorder)., coming in for Leg Swelling.  Patient states that pt has been having leg swelling since         No Known Allergies    Patient's Medications   New Prescriptions    No medications on file   Previous Medications    APIXABAN (ELIQUIS) 5 MG TABLET    Take 1 tablet by mouth 2 times daily.    ASPIRIN 81 MG TABLET CHEWABLE    Chew 1 tablet daily for 90 days.    ATORVASTATIN (LIPITOR) 40 MG TABLET    Take 1 tablet by mouth nightly at bedtime for 90 days.    CARVEDILOL (COREG) 3.125 MG TABLET    Take 1 tablet by mouth 2 times daily for 90 days.    CHLORTHALIDONE (HYGROTON) 25 MG TABLET    Take by mouth daily.    CYANOCOBALAMIN (VITAMIN B12) 1000 MCG/ML SOLUTION    into the muscle.    DULOXETINE (CYMBALTA) 60 MG CAPSULE DELAYED RELEASE PARTICLES    Take by mouth daily.    GABAPENTIN (NEURONTIN) 600 MG TABLET    Take by mouth 3 times daily.    KETOCONAZOLE (NIZORAL) 2 % CREAM    Apply topically daily.    METFORMIN (GLUCOPHAGE) 1000 MG TABLET    Take by mouth 2 times daily (with meals). SrCr:   mg/dl   [last value]    MIRTAZAPINE (REMERON) 30 MG TABLET    Take by mouth nightly at bedtime.    TAMSULOSIN (FLOMAX) 0.4 MG CAPSULE    Take by mouth daily.   Modified Medications    No medications on file   Discontinued Medications    No medications on file       Past Medical History:   Diagnosis Date   ? Anxiety    ? Anxiety    ? Depressed    ? Depression    ? Diabetes mellitus    ? Drug abuse    ? Hypertension    ? PTSD (post-traumatic stress disorder)    ? PTSD (post-traumatic stress disorder)        Past Surgical History:   Procedure Laterality Date   ? BACK SURGERY     ? KNEE SURGERY

## 2016-07-31 NOTE — ED Provider Notes
for 90 days.Disp-60 tablet, R-2, Normal      chlorthalidone (HYGROTON) 25 MG Tablet Take by mouth daily.Historical Med      cyanocobalamin (vitamin B12) 1000 MCG/ML Solution into the muscle.Historical Med      DULoxetine (CYMBALTA) 60 MG Capsule Delayed Release Particles Take by mouth daily.Historical Med      gabapentin (NEURONTIN) 600 MG Tablet Take by mouth 3 times daily.Historical Med      ketoconazole (NIZORAL) 2 % Cream Topical, DAILY, Until Discontinued, Historical Med      metFORMIN (GLUCOPHAGE) 1000 MG Tablet Take by mouth 2 times daily (with meals). SrCr:   mg/dl   [last value]Historical Med      mirtazapine (REMERON) 30 MG Tablet Take by mouth nightly at bedtime.Historical Med      tamsulosin (FLOMAX) 0.4 MG Capsule Take by mouth daily.Historical Med             Past Medical History:   Diagnosis Date   ? Anxiety    ? Anxiety    ? Depressed    ? Depression    ? Diabetes mellitus    ? Drug abuse    ? Hypertension    ? PTSD (post-traumatic stress disorder)    ? PTSD (post-traumatic stress disorder)        Past Surgical History:   Procedure Laterality Date   ? BACK SURGERY     ? KNEE SURGERY         No family history on file.    Social History     Social History   ? Marital status: Single     Spouse name: N/A   ? Number of children: N/A   ? Years of education: N/A     Social History Main Topics   ? Smoking status: Never Smoker   ? Smokeless tobacco: Never Used   ? Alcohol use No   ? Drug use: None   ? Sexual activity: Not Asked     Other Topics Concern   ? None     Social History Narrative       Review of Systems   Constitutional: Negative for fever, chills, diaphoresis, activity change, appetite change and unexpected weight change.   Respiratory: Negative for shortness of breath and wheezing.    Cardiovascular: Negative for chest pain and palpitations.   Gastrointestinal: Negative for nausea, vomiting, abdominal pain, diarrhea and constipation.   Genitourinary: Negative for dysuria.

## 2016-07-31 NOTE — ED Provider Notes
?   PTSD (post-traumatic stress disorder)        Past Surgical History:   Procedure Laterality Date   ? BACK SURGERY     ? KNEE SURGERY         No family history on file.    Social History     Social History   ? Marital status: Single     Spouse name: N/A   ? Number of children: N/A   ? Years of education: N/A     Social History Main Topics   ? Smoking status: Never Smoker   ? Smokeless tobacco: Never Used   ? Alcohol use No   ? Drug use: None   ? Sexual activity: Not Asked     Other Topics Concern   ? None     Social History Narrative       Review of Systems    Physical Exam     ED Triage Vitals   BP 07/31/16 0902 148/90   Pulse 07/31/16 0902 78   Resp 07/31/16 0902 16   Temp 07/31/16 0902 37 ?C (98.6 ?F)   Temp src 07/31/16 0902 Oral   Height 07/31/16 0902 1.778 m   Weight 07/31/16 0902 99.8 kg   SpO2 07/31/16 0902 98 %   BMI (Calculated) 07/31/16 0902 31.63             Physical Exam    Differential DDx: ***    Is this an Emergent Medical Condition? {SH ED EMERGENT MEDICAL CONDITION:[id number]}  409.901 FS  641.19 FS  627.732 (16) FS    ED Workup   Procedures    Labs:  -   POCT GLUCOSE - Normal       Result Value Ref Range    Glucose (Meter) 84  60.0 - 99.0 mg/dL   POCT GLUCOSE         Imaging (Read by ED Provider):  {Imaging [phone]}    EKG (Read by ED Provider):  {EKG findings:[phone]}    MDM    ED Course & Re-Evaluation     ED Course       Pt with possible early cellulitis/      ED Disposition   ED Disposition: No ED Disposition Set      ED Clinical Impression   ED Clinical Impression:   No Clinical Impression Set      ED Patient Status   Patient Status:   {[facility] PATIENT STATUS:[id number]}        ED Medical Evaluation Initiated   Medical Evaluation Initiated:   Yes, filed at 07/31/16 1104  by Janora NorlanderJiang, Robert, MD

## 2016-07-31 NOTE — ED Provider Notes
ED Workup   ED Ultrasound Performed  Date/Time: 07/31/2016 7:54 PM  Performed by: Janora NorlanderJIANG, ROBERT  Authorized by: Janora NorlanderJIANG, ROBERT   The ultrasound procedure performed was soft tissue ultrasound.   Reasons for the procedure performed included edema.   Cellulitis present? yes.  Abscess present? no.  Foreign body present? no.  Patient tolerance: Patient tolerated the procedure well with no immediate complications    Patient tolerance: Patient tolerated the procedure well with no immediate complications.    US reviewed and attestation reviewed by ED attending        Labs:  -   POCT GLUCOSE - Normal       Result Value Ref Range    Glucose (Meter) 84  60.0 - 99.0 mg/dL   POCT GLUCOSE         Imaging (Read by ED Provider):  not applicable    EKG (Read by ED Provider):  not applicable    MDM  Number of Diagnoses or Management Options  Acute deep vein thrombosis (DVT) of both lower extremities, unspecified vein:   Cellulitis of left upper extremity:   Hyperlipidemia, unspecified hyperlipidemia type:      Amount and/or Complexity of Data Reviewed  Clinical lab tests: ordered and reviewed  Decide to obtain previous medical records or to obtain history from someone other than the patient: yes  Review and summarize past medical records: yes        ED Course & Re-Evaluation     ED Course   55 yo with known DVT, with continued pain and swelling. PT diagnosed last month, has been compliant with his eliquis.  Pt denies other medical complaints. Pt states _ pain in bilateral knees.  B/l DVT. No CP, SOB, hemoptysis.      VSS, no tachycardia.  Scratchign to medial L leg.  Possible developing cellulits    Showed pt DVT US, showed pt clto burden.  Celulitis.   Pt with possible early cellulitis/swelling  Janora Norlanderobert Jiang, MD 11:10 PM 07/31/2016    Plan to discharge with keflex for early cellulitis.  Janora Norlanderobert Jiang, MD 11:44 PM 07/31/2016      ED Disposition   ED Disposition: Discharge      ED Clinical Impression   ED Clinical Impression:

## 2016-07-31 NOTE — ED Provider Notes
Acute deep vein thrombosis (DVT) of both lower extremities, unspecified vein  Hyperlipidemia, unspecified hyperlipidemia type  Cellulitis of left upper extremity      ED Patient Status   Patient Status:   Good        ED Medical Evaluation Initiated   Medical Evaluation Initiated:   Yes, filed at 07/31/16 1104  by Janora NorlanderJiang, Robert, MD             Janora NorlanderJiang, Robert, MD  Resident  07/31/16 [phone]

## 2016-08-14 IMAGING — MR MR KNEE*R* W/O CM
4 of 6 series · 19 of 40 positions shown · non-contrast
Comparison: None.

CLINICAL DATA: Right knee pain and weakness for 20 years. History
of arthroscopic knee surgery.

EXAM:
MRI OF THE RIGHT KNEE WITHOUT CONTRAST
TECHNIQUE: Multiplanar, multisequence MR imaging of the knee was performed. No
intravenous contrast was administered.

[Series 3: PD fat-sat · axial · 4.0mm · 0.29mm/px · z∈[-53,+72]mm · 7 of 26 slices shown (1 of 4)]
[im 1/26]
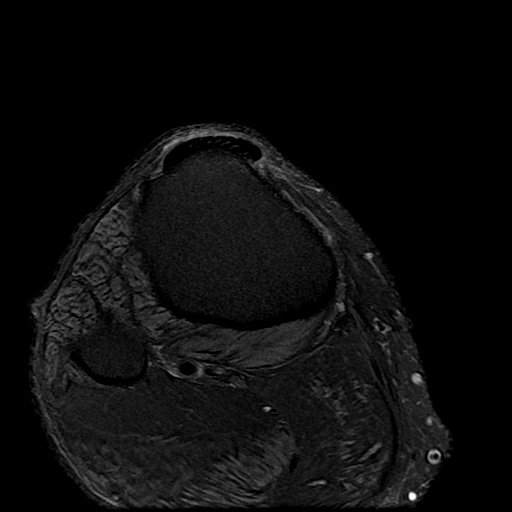
[im 5/26]
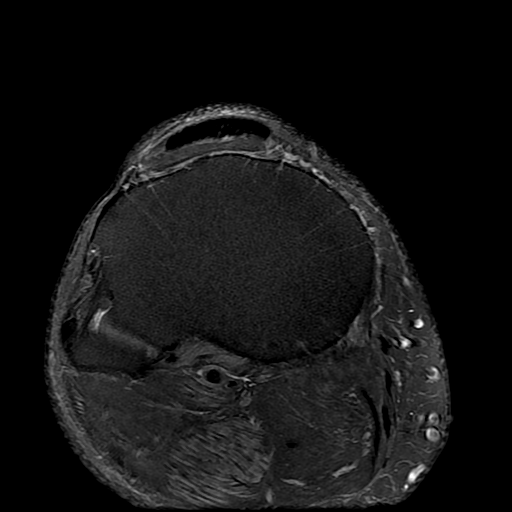
[im 9/26]
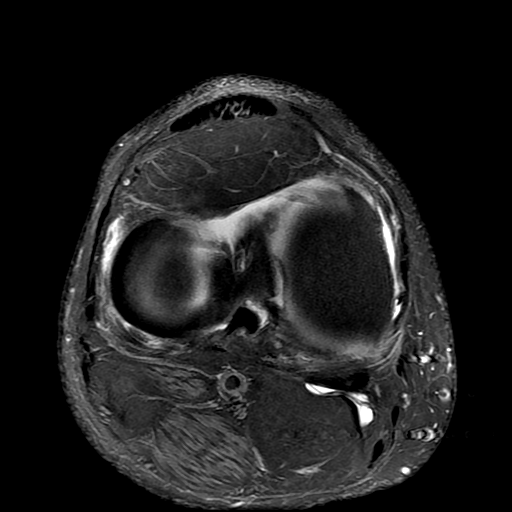
[im 13/26]
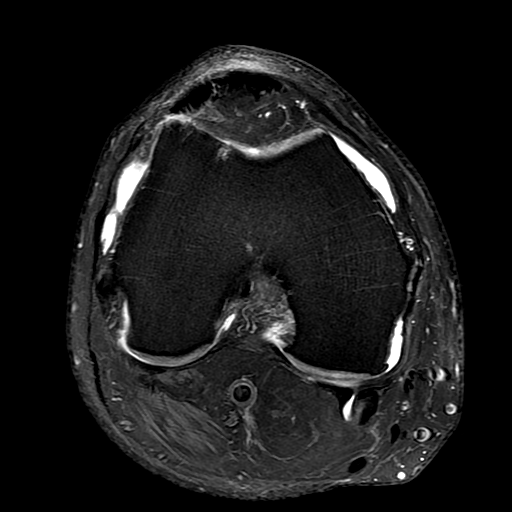
[im 17/26]
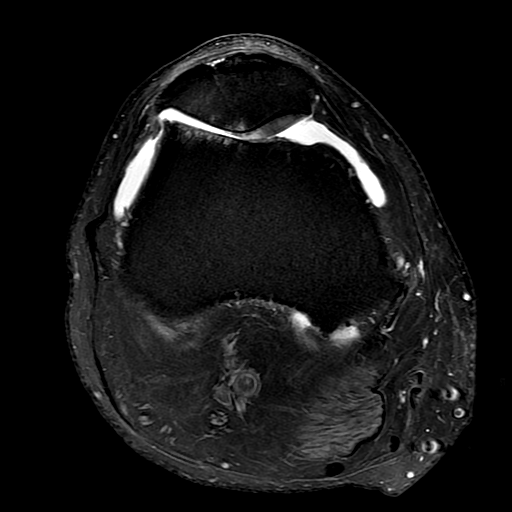
[im 21/26]
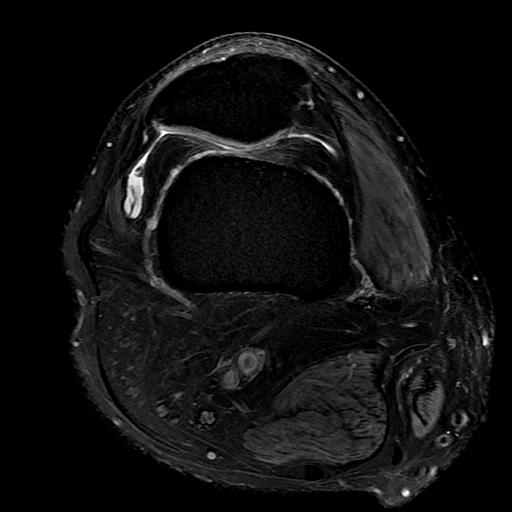
[im 26/26]
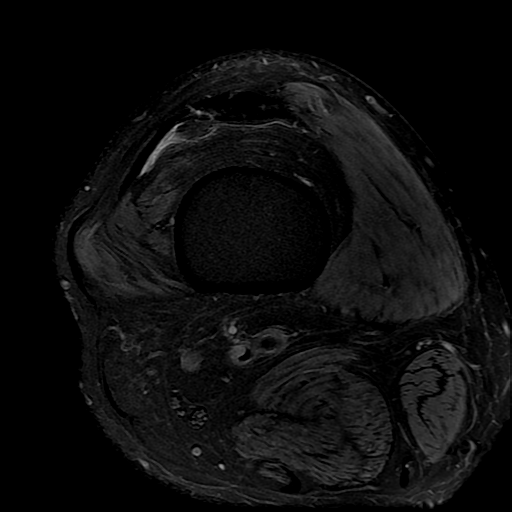

[Series 4: PD fat-sat · sagittal · 3.0mm · 0.29mm/px · 6 of 27 slices shown (2 of 4)]
[im 1/27]
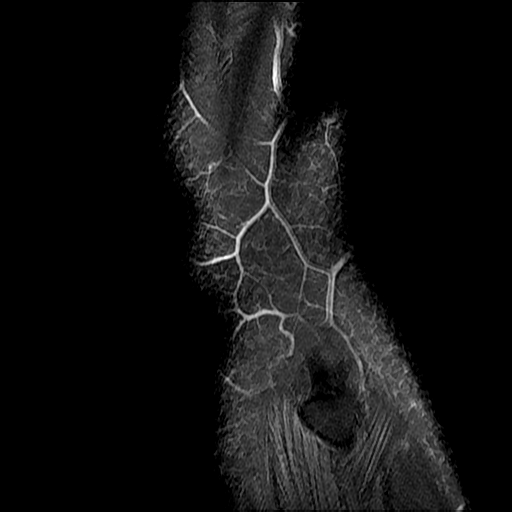
[im 4/27]
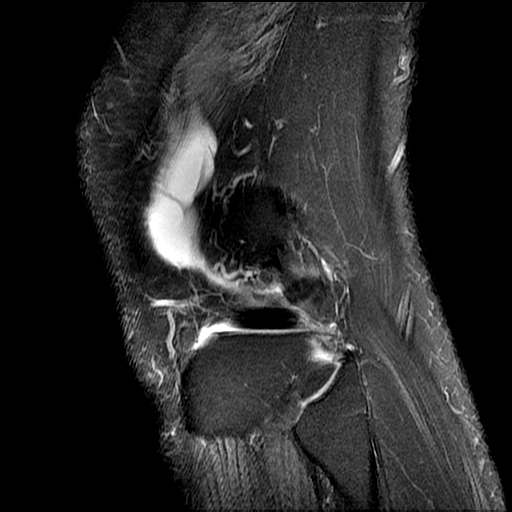
[im 8/27]
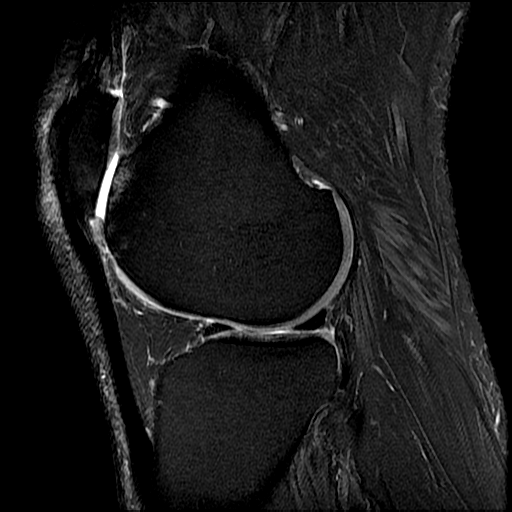
[im 12/27]
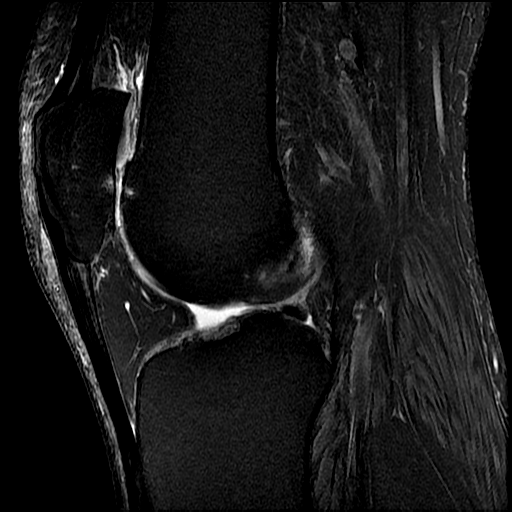
[im 15/27]
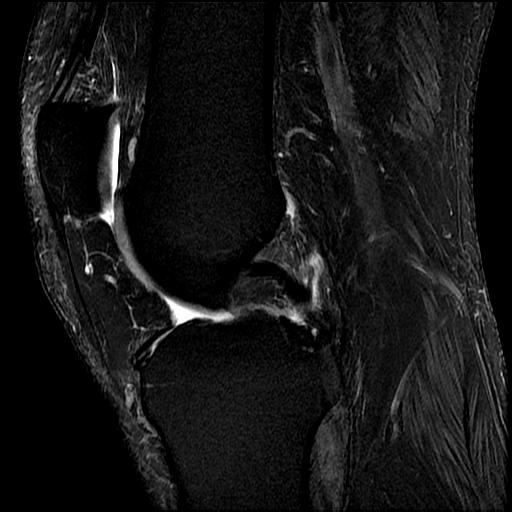
[im 23/27]
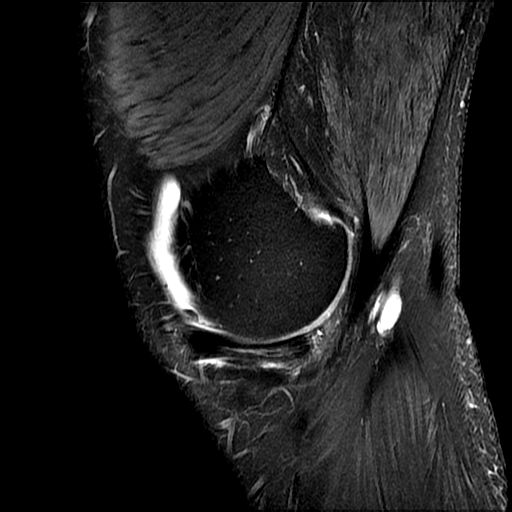

[Series 7: PD fat-sat · coronal · 4.0mm · 0.31mm/px · 3 of 26 slices shown (3 of 4)]
[im 5/26]
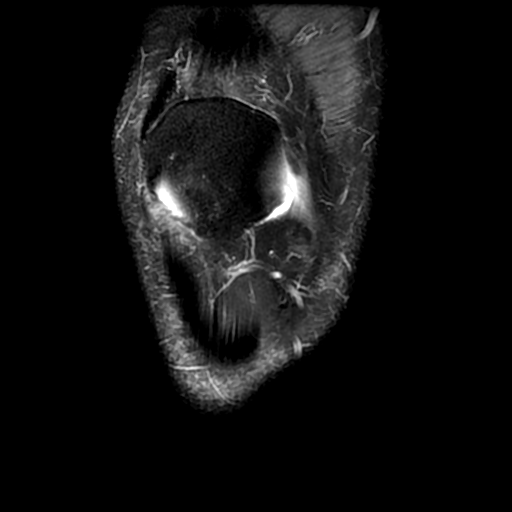
[im 13/26]
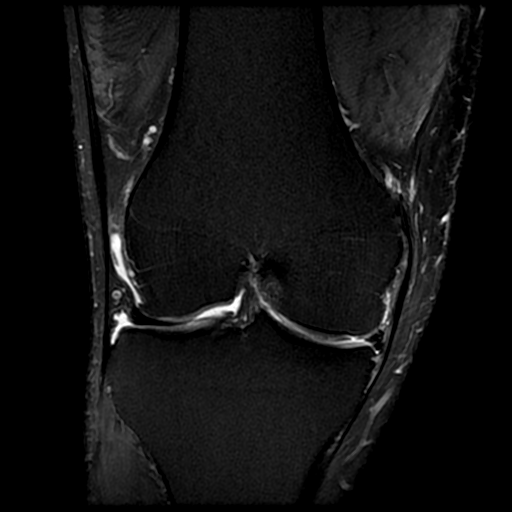
[im 21/26]
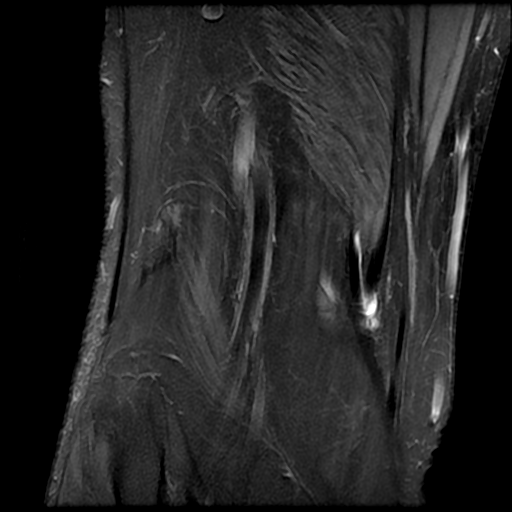

[Series 8: PD fat-sat · oblique · 2.0mm · 0.31mm/px · 3 of 13 slices shown (4 of 4)]
[im 1/13]
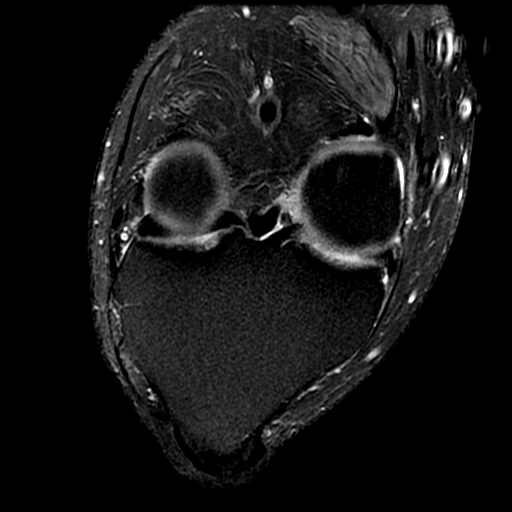
[im 9/13]
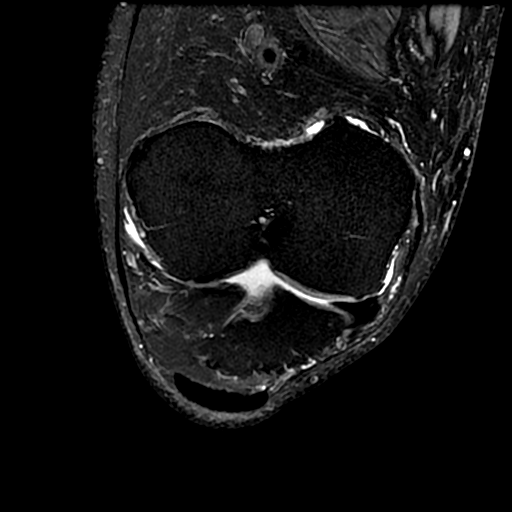
[im 13/13]
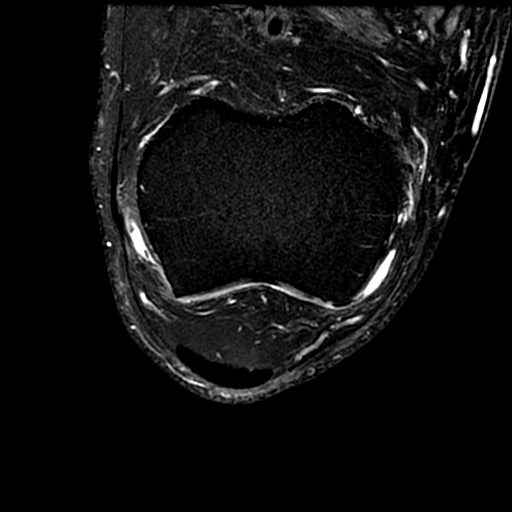

[19 of 40 positions shown; findings below may reference images not displayed]

FINDINGS: MENISCI

Medial meniscus: There are postsurgical changes mainly involving the
anterior horn mid body junction region. There are inferior articular
surface tears involving the posterior horn with a small flap like
component in the medial gutter.

Lateral meniscus:  Intact.

LIGAMENTS

Cruciates:  Intact.

Collaterals:  Intact.

CARTILAGE

Patellofemoral: Advanced degenerative chondrosis with grade 4
chondromalacia involving the lateral facet and part of the patellar
apex.

Medial: Moderate degenerative chondrosis with mild medial joint
space narrowing and osteophytic spurring.

Lateral: Moderate degenerative chondrosis with mild joint space
narrowing and osteophytic spurring.

Joint:  Small joint effusion and mild synovitis.

Popliteal Fossa:  Small Baker's cyst

Extensor Mechanism: The patella retinacular structures are intact.
The quadriceps and patellar tendons are intact. There is proximal
patellar tendinopathy.

Bones:  No acute bony findings.
IMPRESSION: 1. Postoperative changes involving the medial meniscus mainly along
the anterior horn mid body junction region.
2. Inferior articular surface tears involving the posterior horn of
the medial meniscus with a small flap-like component in the medial
gutter.
3. Intact ligamentous structures and no acute bony findings.
4. Tricompartmental degenerative changes with areas of grade 4
chondromalacia involving the patellofemoral joint.
5. Small joint effusion and small Baker's cyst.
6. Proximal patellar tendinopathy.

## 2016-08-14 IMAGING — MR MR KNEE*L* W/O CM
4 of 6 series · 19 of 40 positions shown · non-contrast
Comparison: None.

CLINICAL DATA: Chronic bilateral knee pain. History of prior
surgery.

EXAM:
MRI OF THE LEFT KNEE WITHOUT CONTRAST
TECHNIQUE: Multiplanar, multisequence MR imaging of the knee was performed. No
intravenous contrast was administered.

[Series 3: PD fat-sat · axial · 4.0mm · 0.31mm/px · z∈[-65,+60]mm · 7 of 26 slices shown (1 of 4)]
[im 1/26]
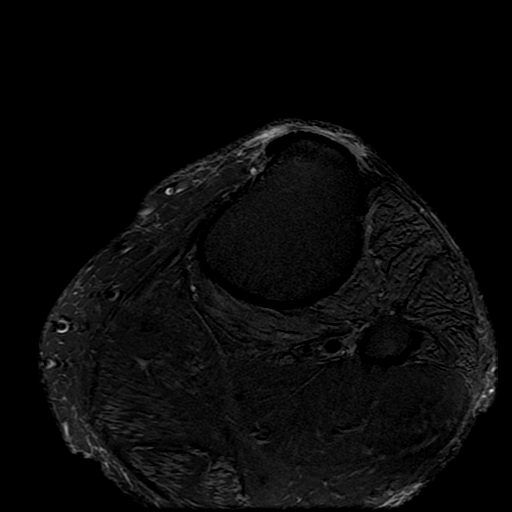
[im 5/26]
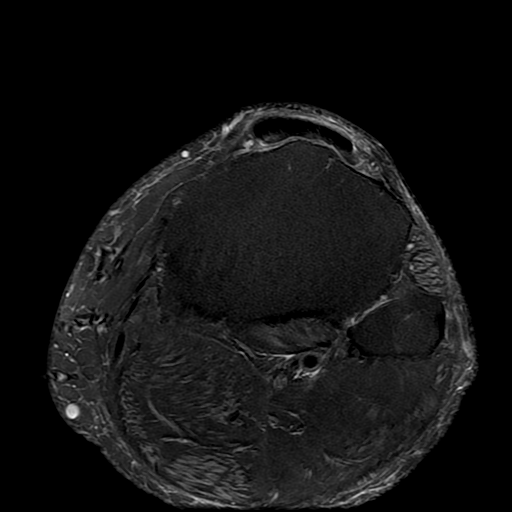
[im 9/26]
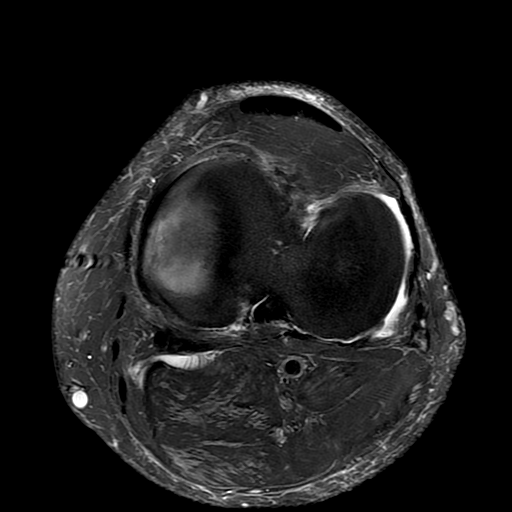
[im 13/26]
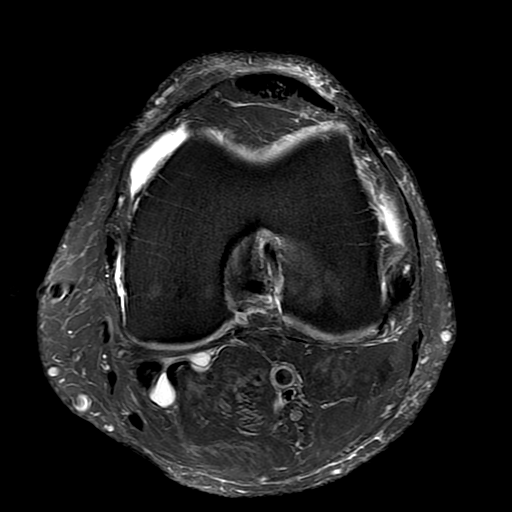
[im 17/26]
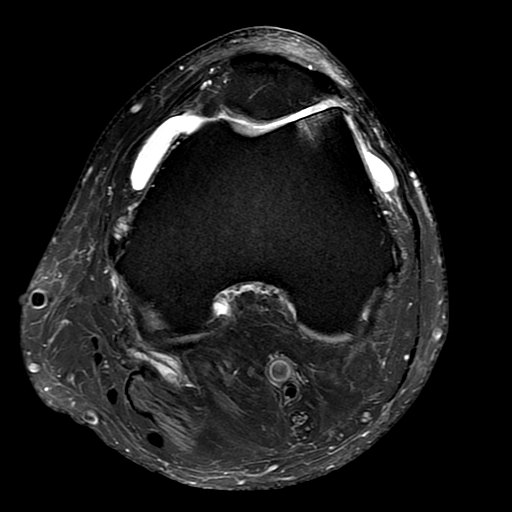
[im 21/26]
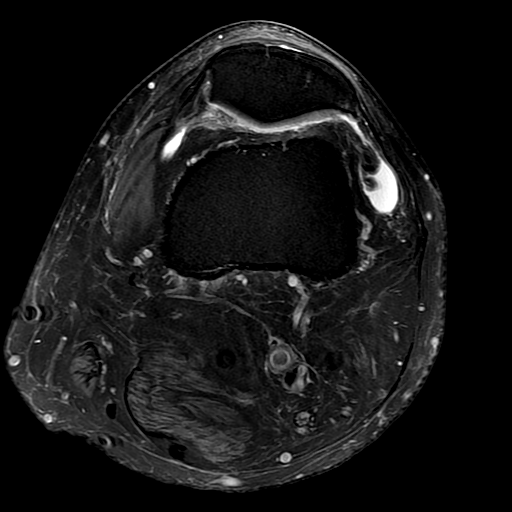
[im 26/26]
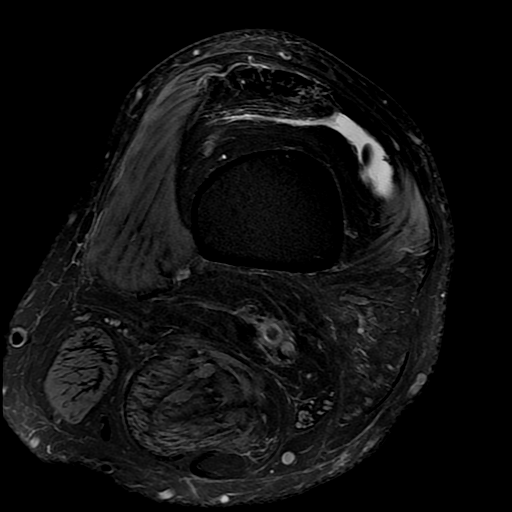

[Series 4: PD fat-sat · sagittal · 3.0mm · 0.31mm/px · 6 of 28 slices shown (2 of 4)]
[im 1/28]
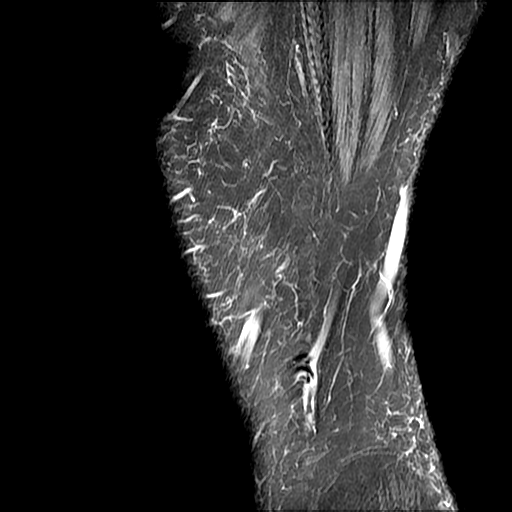
[im 4/28]
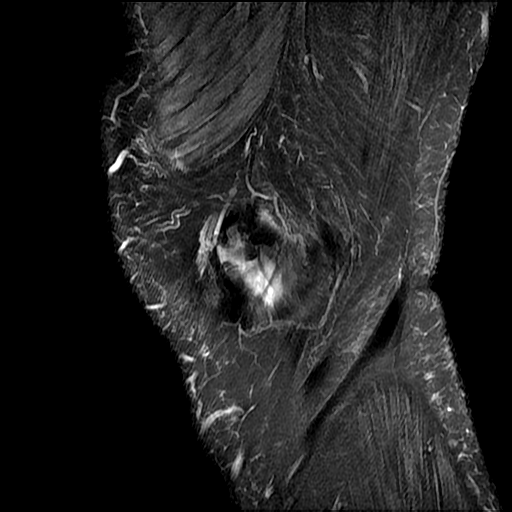
[im 8/28]
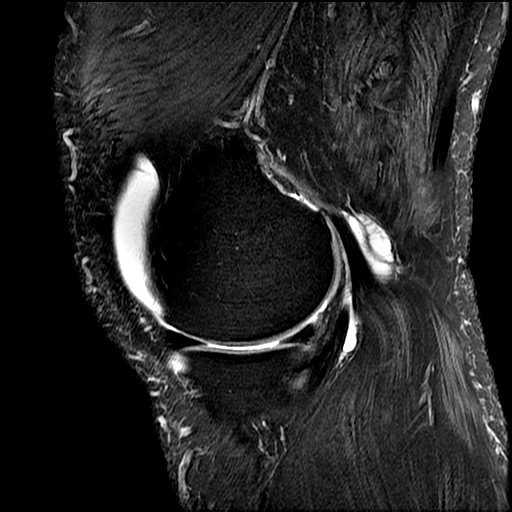
[im 12/28]
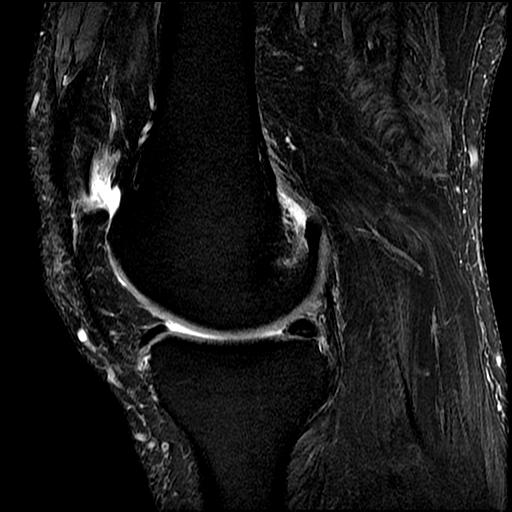
[im 16/28]
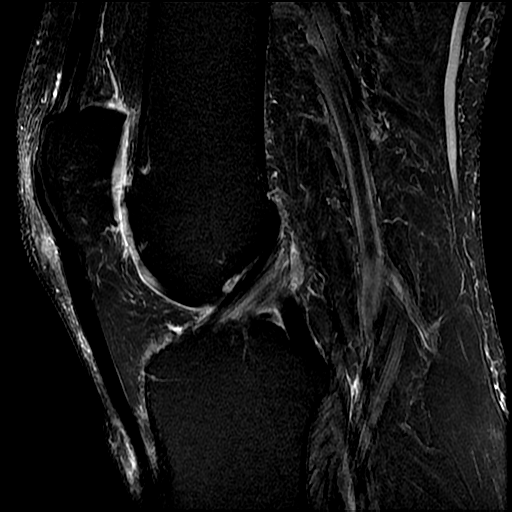
[im 24/28]
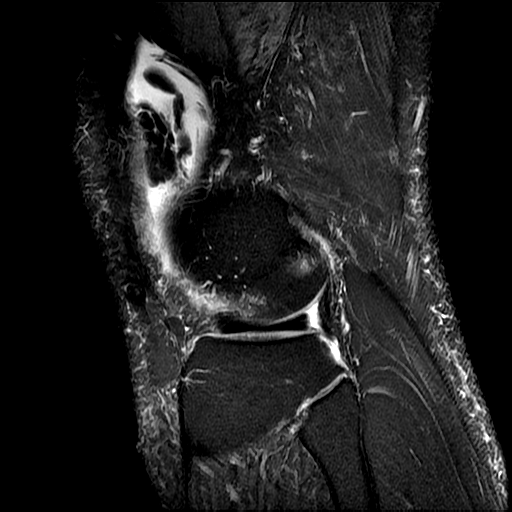

[Series 7: PD fat-sat · coronal · 4.0mm · 0.31mm/px · 3 of 26 slices shown (3 of 4)]
[im 5/26]
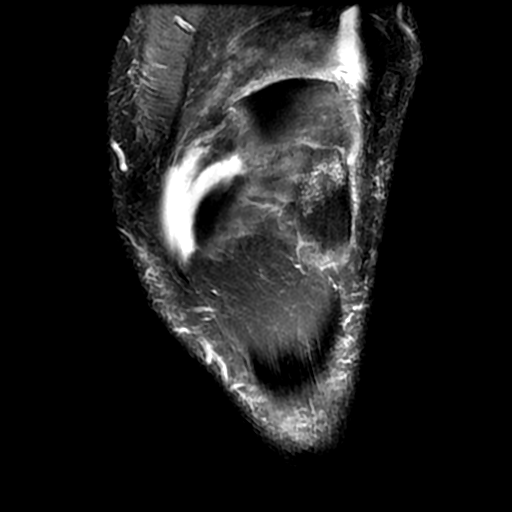
[im 13/26]
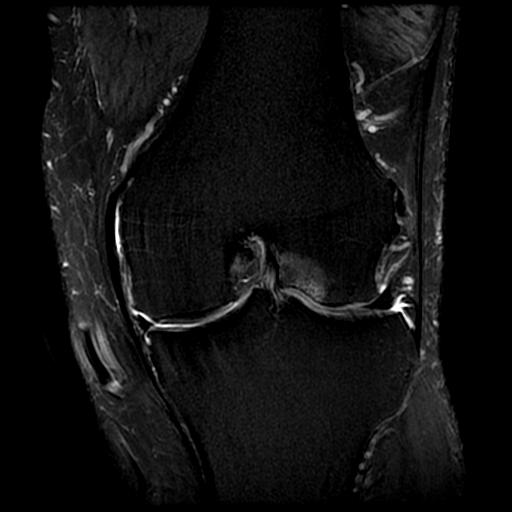
[im 21/26]
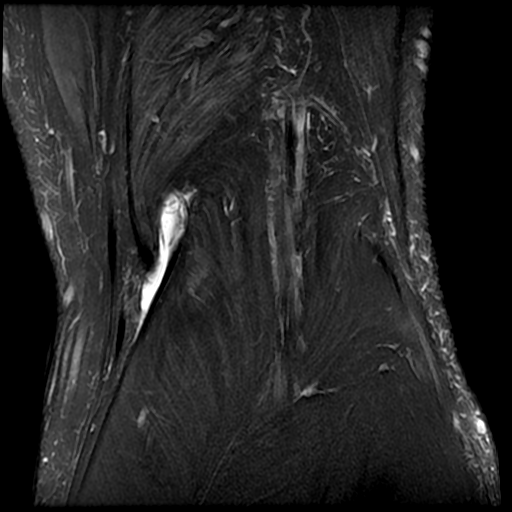

[Series 8: PD fat-sat · oblique · 2.0mm · 0.31mm/px · 3 of 13 slices shown (4 of 4)]
[im 1/13]
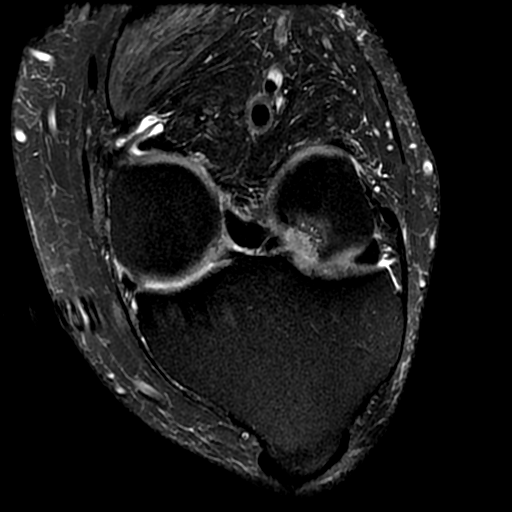
[im 9/13]
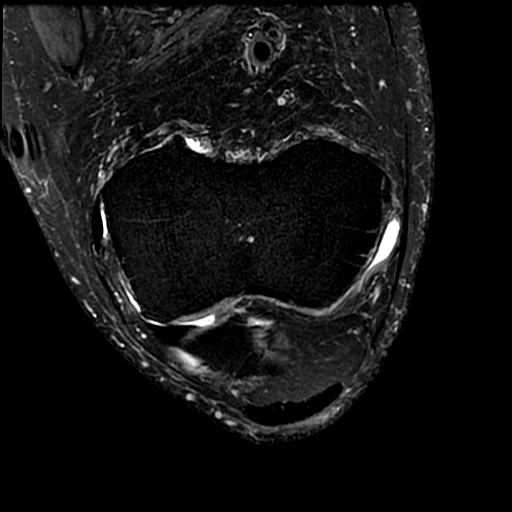
[im 13/13]
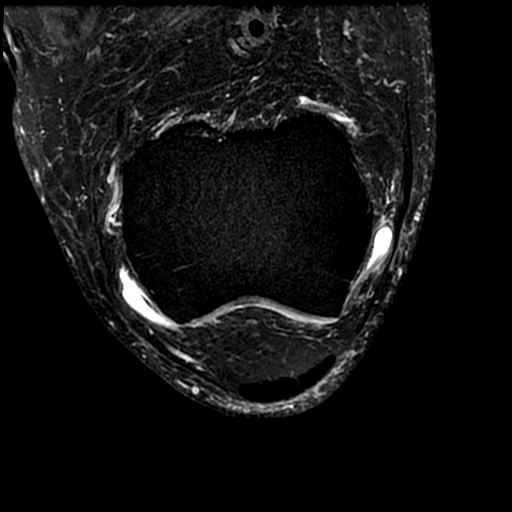

[19 of 40 positions shown; findings below may reference images not displayed]

FINDINGS: MENISCI

Medial meniscus: The body of the medial meniscus appears truncated
possibly due to prior surgery. No meniscal tear is identified.

Lateral meniscus:  Intact.

LIGAMENTS

Cruciates:  Intact.

Collaterals:  Intact.

CARTILAGE

Patellofemoral: Hyaline cartilage along the lateral patellar facet
and lateral femoral trochlea is denuded with associated mild
underlying subchondral edema.

Medial:  Mildly degenerated.

Lateral: There is a focal osteochondral defect centrally in the
weight-bearing surface of the lateral femoral trochlea measuring 1
cm AP by 1 cm transverse. Underlying cortical bone irregular with
associated edema. No evidence of fragment instability is seen.

Joint:  Small joint effusion is present.

Popliteal Fossa: Small Baker's cyst measures 4 cm craniocaudal by 2
cm transverse by 1 cm AP.

Extensor Mechanism: Intact. Mild prepatellar edema is identified
suggestive of bursitis.

Bones:  Small osteophytes are present about the knee.
IMPRESSION: Negative for meniscal or ligament tear.

Osteochondral lesion weight-bearing surface the medial femoral
condyle as described with associated marrow edema and irregularity
of subchondral bone but no evidence of fragment instability.

Osteoarthritis appearing worst in the patellofemoral compartment.

Small Baker's cyst

## 2016-11-04 DIAGNOSIS — R45851 Suicidal ideations: Secondary | ICD-10-CM | POA: Diagnosis not present

## 2016-11-04 DIAGNOSIS — M25571 Pain in right ankle and joints of right foot: Secondary | ICD-10-CM | POA: Diagnosis not present

## 2016-11-04 DIAGNOSIS — M7989 Other specified soft tissue disorders: Secondary | ICD-10-CM | POA: Diagnosis not present

## 2016-11-04 DIAGNOSIS — R4182 Altered mental status, unspecified: Secondary | ICD-10-CM | POA: Diagnosis not present

## 2016-11-04 DIAGNOSIS — T50901A Poisoning by unspecified drugs, medicaments and biological substances, accidental (unintentional), initial encounter: Secondary | ICD-10-CM | POA: Diagnosis not present

## 2016-11-04 DIAGNOSIS — M79669 Pain in unspecified lower leg: Secondary | ICD-10-CM | POA: Diagnosis not present

## 2016-11-04 DIAGNOSIS — R0789 Other chest pain: Secondary | ICD-10-CM | POA: Diagnosis not present

## 2016-11-04 DIAGNOSIS — M79671 Pain in right foot: Secondary | ICD-10-CM | POA: Diagnosis not present

## 2016-11-05 DIAGNOSIS — R45851 Suicidal ideations: Secondary | ICD-10-CM | POA: Diagnosis not present

## 2016-11-05 DIAGNOSIS — M79669 Pain in unspecified lower leg: Secondary | ICD-10-CM | POA: Diagnosis not present

## 2016-11-05 DIAGNOSIS — T50901A Poisoning by unspecified drugs, medicaments and biological substances, accidental (unintentional), initial encounter: Secondary | ICD-10-CM | POA: Diagnosis not present

## 2016-11-06 DIAGNOSIS — R45851 Suicidal ideations: Secondary | ICD-10-CM | POA: Diagnosis not present

## 2016-11-06 DIAGNOSIS — T50901A Poisoning by unspecified drugs, medicaments and biological substances, accidental (unintentional), initial encounter: Secondary | ICD-10-CM | POA: Diagnosis not present

## 2016-11-06 DIAGNOSIS — M79669 Pain in unspecified lower leg: Secondary | ICD-10-CM | POA: Diagnosis not present

## 2016-11-14 DIAGNOSIS — Z23 Encounter for immunization: Secondary | ICD-10-CM | POA: Diagnosis not present

## 2016-12-16 DIAGNOSIS — R45851 Suicidal ideations: Secondary | ICD-10-CM | POA: Diagnosis not present

## 2016-12-16 DIAGNOSIS — M4802 Spinal stenosis, cervical region: Secondary | ICD-10-CM | POA: Diagnosis not present

## 2016-12-16 DIAGNOSIS — N189 Chronic kidney disease, unspecified: Secondary | ICD-10-CM | POA: Diagnosis not present

## 2016-12-16 DIAGNOSIS — M5021 Other cervical disc displacement,  high cervical region: Secondary | ICD-10-CM | POA: Diagnosis not present

## 2016-12-16 DIAGNOSIS — G9340 Encephalopathy, unspecified: Secondary | ICD-10-CM | POA: Diagnosis not present

## 2016-12-16 DIAGNOSIS — M47812 Spondylosis without myelopathy or radiculopathy, cervical region: Secondary | ICD-10-CM | POA: Diagnosis not present

## 2016-12-16 DIAGNOSIS — R4182 Altered mental status, unspecified: Secondary | ICD-10-CM | POA: Diagnosis not present

## 2016-12-16 DIAGNOSIS — N179 Acute kidney failure, unspecified: Secondary | ICD-10-CM | POA: Diagnosis not present

## 2016-12-16 DIAGNOSIS — G9389 Other specified disorders of brain: Secondary | ICD-10-CM | POA: Diagnosis not present

## 2016-12-17 DIAGNOSIS — R45851 Suicidal ideations: Secondary | ICD-10-CM | POA: Diagnosis not present

## 2016-12-17 DIAGNOSIS — G9340 Encephalopathy, unspecified: Secondary | ICD-10-CM | POA: Diagnosis not present

## 2016-12-17 DIAGNOSIS — N189 Chronic kidney disease, unspecified: Secondary | ICD-10-CM | POA: Diagnosis not present

## 2016-12-17 DIAGNOSIS — N179 Acute kidney failure, unspecified: Secondary | ICD-10-CM | POA: Diagnosis not present

## 2016-12-18 DIAGNOSIS — Z794 Long term (current) use of insulin: Secondary | ICD-10-CM | POA: Diagnosis not present

## 2016-12-18 DIAGNOSIS — R9431 Abnormal electrocardiogram [ECG] [EKG]: Secondary | ICD-10-CM | POA: Diagnosis not present

## 2016-12-18 DIAGNOSIS — E1165 Type 2 diabetes mellitus with hyperglycemia: Secondary | ICD-10-CM | POA: Diagnosis not present

## 2016-12-18 DIAGNOSIS — N179 Acute kidney failure, unspecified: Secondary | ICD-10-CM | POA: Diagnosis not present

## 2016-12-18 DIAGNOSIS — N189 Chronic kidney disease, unspecified: Secondary | ICD-10-CM | POA: Diagnosis not present

## 2016-12-18 DIAGNOSIS — G9340 Encephalopathy, unspecified: Secondary | ICD-10-CM | POA: Diagnosis not present

## 2016-12-18 DIAGNOSIS — R45851 Suicidal ideations: Secondary | ICD-10-CM | POA: Diagnosis not present

## 2016-12-18 DIAGNOSIS — I10 Essential (primary) hypertension: Secondary | ICD-10-CM | POA: Diagnosis not present

## 2016-12-19 DIAGNOSIS — G9340 Encephalopathy, unspecified: Secondary | ICD-10-CM | POA: Diagnosis not present

## 2016-12-19 DIAGNOSIS — I10 Essential (primary) hypertension: Secondary | ICD-10-CM | POA: Diagnosis not present

## 2016-12-19 DIAGNOSIS — R45851 Suicidal ideations: Secondary | ICD-10-CM | POA: Diagnosis not present

## 2016-12-20 DIAGNOSIS — I10 Essential (primary) hypertension: Secondary | ICD-10-CM | POA: Diagnosis not present

## 2016-12-20 DIAGNOSIS — R45851 Suicidal ideations: Secondary | ICD-10-CM | POA: Diagnosis not present

## 2016-12-20 DIAGNOSIS — G9340 Encephalopathy, unspecified: Secondary | ICD-10-CM | POA: Diagnosis not present

## 2016-12-21 DIAGNOSIS — G9340 Encephalopathy, unspecified: Secondary | ICD-10-CM | POA: Diagnosis not present

## 2016-12-21 DIAGNOSIS — I10 Essential (primary) hypertension: Secondary | ICD-10-CM | POA: Diagnosis not present

## 2016-12-21 DIAGNOSIS — R45851 Suicidal ideations: Secondary | ICD-10-CM | POA: Diagnosis not present

## 2016-12-22 DIAGNOSIS — I10 Essential (primary) hypertension: Secondary | ICD-10-CM | POA: Diagnosis not present

## 2016-12-22 DIAGNOSIS — R7309 Other abnormal glucose: Secondary | ICD-10-CM | POA: Diagnosis not present

## 2016-12-22 DIAGNOSIS — G9340 Encephalopathy, unspecified: Secondary | ICD-10-CM | POA: Diagnosis not present

## 2016-12-22 DIAGNOSIS — R45851 Suicidal ideations: Secondary | ICD-10-CM | POA: Diagnosis not present

## 2016-12-23 DIAGNOSIS — R45851 Suicidal ideations: Secondary | ICD-10-CM | POA: Diagnosis not present

## 2016-12-23 DIAGNOSIS — N179 Acute kidney failure, unspecified: Secondary | ICD-10-CM | POA: Diagnosis not present

## 2016-12-23 DIAGNOSIS — G9340 Encephalopathy, unspecified: Secondary | ICD-10-CM | POA: Diagnosis not present

## 2016-12-23 DIAGNOSIS — N189 Chronic kidney disease, unspecified: Secondary | ICD-10-CM | POA: Diagnosis not present

## 2016-12-23 DIAGNOSIS — I10 Essential (primary) hypertension: Secondary | ICD-10-CM | POA: Diagnosis not present

## 2016-12-23 DIAGNOSIS — R739 Hyperglycemia, unspecified: Secondary | ICD-10-CM | POA: Diagnosis not present

## 2016-12-24 DIAGNOSIS — I10 Essential (primary) hypertension: Secondary | ICD-10-CM | POA: Diagnosis not present

## 2016-12-24 DIAGNOSIS — R7309 Other abnormal glucose: Secondary | ICD-10-CM | POA: Diagnosis not present

## 2016-12-30 DIAGNOSIS — E119 Type 2 diabetes mellitus without complications: Secondary | ICD-10-CM | POA: Diagnosis not present

## 2016-12-30 DIAGNOSIS — D649 Anemia, unspecified: Secondary | ICD-10-CM | POA: Diagnosis not present

## 2016-12-30 DIAGNOSIS — I10 Essential (primary) hypertension: Secondary | ICD-10-CM | POA: Diagnosis not present

## 2016-12-30 DIAGNOSIS — Z7984 Long term (current) use of oral hypoglycemic drugs: Secondary | ICD-10-CM | POA: Diagnosis not present

## 2016-12-30 DIAGNOSIS — F329 Major depressive disorder, single episode, unspecified: Secondary | ICD-10-CM | POA: Diagnosis not present

## 2016-12-30 DIAGNOSIS — R42 Dizziness and giddiness: Secondary | ICD-10-CM | POA: Diagnosis not present

## 2016-12-30 DIAGNOSIS — Z79899 Other long term (current) drug therapy: Secondary | ICD-10-CM | POA: Diagnosis not present

## 2016-12-30 DIAGNOSIS — E860 Dehydration: Secondary | ICD-10-CM | POA: Diagnosis not present

## 2016-12-30 DIAGNOSIS — R55 Syncope and collapse: Secondary | ICD-10-CM | POA: Diagnosis not present

## 2017-05-29 DIAGNOSIS — E1165 Type 2 diabetes mellitus with hyperglycemia: Secondary | ICD-10-CM | POA: Diagnosis not present

## 2017-05-30 DIAGNOSIS — E1165 Type 2 diabetes mellitus with hyperglycemia: Secondary | ICD-10-CM | POA: Diagnosis not present

## 2017-05-30 DIAGNOSIS — Z794 Long term (current) use of insulin: Secondary | ICD-10-CM | POA: Diagnosis not present

## 2017-05-31 DIAGNOSIS — E1165 Type 2 diabetes mellitus with hyperglycemia: Secondary | ICD-10-CM | POA: Diagnosis not present

## 2017-05-31 DIAGNOSIS — Z794 Long term (current) use of insulin: Secondary | ICD-10-CM | POA: Diagnosis not present

## 2017-06-01 DIAGNOSIS — Z794 Long term (current) use of insulin: Secondary | ICD-10-CM | POA: Diagnosis not present

## 2017-06-01 DIAGNOSIS — E1165 Type 2 diabetes mellitus with hyperglycemia: Secondary | ICD-10-CM | POA: Diagnosis not present

## 2017-06-02 DIAGNOSIS — Z794 Long term (current) use of insulin: Secondary | ICD-10-CM | POA: Diagnosis not present

## 2017-06-02 DIAGNOSIS — E1165 Type 2 diabetes mellitus with hyperglycemia: Secondary | ICD-10-CM | POA: Diagnosis not present

## 2017-06-03 DIAGNOSIS — E1165 Type 2 diabetes mellitus with hyperglycemia: Secondary | ICD-10-CM | POA: Diagnosis not present

## 2017-06-04 DIAGNOSIS — E1165 Type 2 diabetes mellitus with hyperglycemia: Secondary | ICD-10-CM | POA: Diagnosis not present

## 2017-06-05 DIAGNOSIS — E1165 Type 2 diabetes mellitus with hyperglycemia: Secondary | ICD-10-CM | POA: Diagnosis not present

## 2017-07-08 DIAGNOSIS — M79669 Pain in unspecified lower leg: Secondary | ICD-10-CM | POA: Diagnosis not present

## 2017-07-08 DIAGNOSIS — I82403 Acute embolism and thrombosis of unspecified deep veins of lower extremity, bilateral: Secondary | ICD-10-CM | POA: Diagnosis not present

## 2017-07-08 DIAGNOSIS — I82409 Acute embolism and thrombosis of unspecified deep veins of unspecified lower extremity: Secondary | ICD-10-CM | POA: Diagnosis not present

## 2017-07-08 DIAGNOSIS — N179 Acute kidney failure, unspecified: Secondary | ICD-10-CM | POA: Diagnosis not present

## 2017-07-08 DIAGNOSIS — I10 Essential (primary) hypertension: Secondary | ICD-10-CM | POA: Diagnosis not present

## 2017-07-09 DIAGNOSIS — M79669 Pain in unspecified lower leg: Secondary | ICD-10-CM | POA: Diagnosis not present

## 2017-07-09 DIAGNOSIS — I82409 Acute embolism and thrombosis of unspecified deep veins of unspecified lower extremity: Secondary | ICD-10-CM | POA: Diagnosis not present

## 2017-07-09 DIAGNOSIS — I10 Essential (primary) hypertension: Secondary | ICD-10-CM | POA: Diagnosis not present

## 2017-07-09 DIAGNOSIS — N179 Acute kidney failure, unspecified: Secondary | ICD-10-CM | POA: Diagnosis not present

## 2017-07-10 DIAGNOSIS — I10 Essential (primary) hypertension: Secondary | ICD-10-CM | POA: Diagnosis not present

## 2017-07-10 DIAGNOSIS — I82409 Acute embolism and thrombosis of unspecified deep veins of unspecified lower extremity: Secondary | ICD-10-CM | POA: Diagnosis not present

## 2017-07-10 DIAGNOSIS — M79669 Pain in unspecified lower leg: Secondary | ICD-10-CM | POA: Diagnosis not present

## 2017-07-10 DIAGNOSIS — N179 Acute kidney failure, unspecified: Secondary | ICD-10-CM | POA: Diagnosis not present

## 2017-07-11 DIAGNOSIS — I82409 Acute embolism and thrombosis of unspecified deep veins of unspecified lower extremity: Secondary | ICD-10-CM | POA: Diagnosis not present

## 2017-07-11 DIAGNOSIS — N179 Acute kidney failure, unspecified: Secondary | ICD-10-CM | POA: Diagnosis not present

## 2017-07-11 DIAGNOSIS — M79669 Pain in unspecified lower leg: Secondary | ICD-10-CM | POA: Diagnosis not present

## 2017-07-11 DIAGNOSIS — I10 Essential (primary) hypertension: Secondary | ICD-10-CM | POA: Diagnosis not present

## 2017-07-12 DIAGNOSIS — I10 Essential (primary) hypertension: Secondary | ICD-10-CM | POA: Diagnosis not present

## 2017-07-12 DIAGNOSIS — N179 Acute kidney failure, unspecified: Secondary | ICD-10-CM | POA: Diagnosis not present

## 2017-07-12 DIAGNOSIS — I82409 Acute embolism and thrombosis of unspecified deep veins of unspecified lower extremity: Secondary | ICD-10-CM | POA: Diagnosis not present

## 2017-07-12 DIAGNOSIS — M79669 Pain in unspecified lower leg: Secondary | ICD-10-CM | POA: Diagnosis not present

## 2017-07-13 DIAGNOSIS — M79669 Pain in unspecified lower leg: Secondary | ICD-10-CM | POA: Diagnosis not present

## 2017-07-13 DIAGNOSIS — I10 Essential (primary) hypertension: Secondary | ICD-10-CM | POA: Diagnosis not present

## 2017-07-13 DIAGNOSIS — I82409 Acute embolism and thrombosis of unspecified deep veins of unspecified lower extremity: Secondary | ICD-10-CM | POA: Diagnosis not present

## 2017-07-13 DIAGNOSIS — N179 Acute kidney failure, unspecified: Secondary | ICD-10-CM | POA: Diagnosis not present

## 2018-04-07 ENCOUNTER — Telehealth: Payer: Self-pay

## 2018-04-07 NOTE — Telephone Encounter (Signed)
Pt appears on [employer] Quality Report for [facility] [employer].  I called the pt to confirm their PCP, but there was no answer and no option to leave a message.  VDM (DD) ° °

## 2018-04-13 NOTE — Telephone Encounter (Signed)
Pt appears on [employer] Quality Report for [facility] [employer].  I called the pt to confirm their PCP, but there was no answer and no option to leave a message.  VDM (DD) ° °

## 2018-04-19 NOTE — Telephone Encounter (Signed)
3rd attempt to reach pt. No answer and no option to leave msg. VDM (DD)

## 2018-07-08 DIAGNOSIS — M545 Low back pain: Secondary | ICD-10-CM | POA: Diagnosis not present

## 2018-07-08 DIAGNOSIS — E785 Hyperlipidemia, unspecified: Secondary | ICD-10-CM | POA: Diagnosis not present

## 2018-07-08 DIAGNOSIS — Z7984 Long term (current) use of oral hypoglycemic drugs: Secondary | ICD-10-CM | POA: Diagnosis not present

## 2018-07-08 DIAGNOSIS — M546 Pain in thoracic spine: Secondary | ICD-10-CM | POA: Diagnosis not present

## 2018-07-08 DIAGNOSIS — E1165 Type 2 diabetes mellitus with hyperglycemia: Secondary | ICD-10-CM | POA: Diagnosis not present

## 2018-07-08 DIAGNOSIS — M542 Cervicalgia: Secondary | ICD-10-CM | POA: Diagnosis not present

## 2018-07-08 DIAGNOSIS — R51 Headache: Secondary | ICD-10-CM | POA: Diagnosis not present

## 2018-07-08 DIAGNOSIS — I10 Essential (primary) hypertension: Secondary | ICD-10-CM | POA: Diagnosis not present

## 2018-07-09 DIAGNOSIS — M542 Cervicalgia: Secondary | ICD-10-CM | POA: Diagnosis not present

## 2018-07-09 DIAGNOSIS — M546 Pain in thoracic spine: Secondary | ICD-10-CM | POA: Diagnosis not present

## 2018-07-09 DIAGNOSIS — E785 Hyperlipidemia, unspecified: Secondary | ICD-10-CM | POA: Diagnosis not present

## 2018-07-09 DIAGNOSIS — M545 Low back pain: Secondary | ICD-10-CM | POA: Diagnosis not present

## 2018-07-09 DIAGNOSIS — Z7984 Long term (current) use of oral hypoglycemic drugs: Secondary | ICD-10-CM | POA: Diagnosis not present

## 2018-07-09 DIAGNOSIS — E1165 Type 2 diabetes mellitus with hyperglycemia: Secondary | ICD-10-CM | POA: Diagnosis not present

## 2018-07-09 DIAGNOSIS — I10 Essential (primary) hypertension: Secondary | ICD-10-CM | POA: Diagnosis not present

## 2018-07-10 DIAGNOSIS — Z7984 Long term (current) use of oral hypoglycemic drugs: Secondary | ICD-10-CM | POA: Diagnosis not present

## 2018-07-10 DIAGNOSIS — I10 Essential (primary) hypertension: Secondary | ICD-10-CM | POA: Diagnosis not present

## 2018-07-10 DIAGNOSIS — E1165 Type 2 diabetes mellitus with hyperglycemia: Secondary | ICD-10-CM | POA: Diagnosis not present

## 2018-07-10 DIAGNOSIS — E785 Hyperlipidemia, unspecified: Secondary | ICD-10-CM | POA: Diagnosis not present

## 2018-07-11 DIAGNOSIS — I10 Essential (primary) hypertension: Secondary | ICD-10-CM | POA: Diagnosis not present

## 2018-07-11 DIAGNOSIS — M546 Pain in thoracic spine: Secondary | ICD-10-CM | POA: Diagnosis not present

## 2018-07-11 DIAGNOSIS — Z7984 Long term (current) use of oral hypoglycemic drugs: Secondary | ICD-10-CM | POA: Diagnosis not present

## 2018-07-11 DIAGNOSIS — E1165 Type 2 diabetes mellitus with hyperglycemia: Secondary | ICD-10-CM | POA: Diagnosis not present

## 2018-07-11 DIAGNOSIS — M542 Cervicalgia: Secondary | ICD-10-CM | POA: Diagnosis not present

## 2018-07-11 DIAGNOSIS — E785 Hyperlipidemia, unspecified: Secondary | ICD-10-CM | POA: Diagnosis not present

## 2018-07-12 DIAGNOSIS — E1165 Type 2 diabetes mellitus with hyperglycemia: Secondary | ICD-10-CM | POA: Diagnosis not present

## 2018-07-12 DIAGNOSIS — Z7984 Long term (current) use of oral hypoglycemic drugs: Secondary | ICD-10-CM | POA: Diagnosis not present

## 2018-07-12 DIAGNOSIS — I10 Essential (primary) hypertension: Secondary | ICD-10-CM | POA: Diagnosis not present

## 2018-07-12 DIAGNOSIS — E785 Hyperlipidemia, unspecified: Secondary | ICD-10-CM | POA: Diagnosis not present

## 2018-07-13 DIAGNOSIS — M4712 Other spondylosis with myelopathy, cervical region: Secondary | ICD-10-CM | POA: Diagnosis not present

## 2018-07-13 DIAGNOSIS — M542 Cervicalgia: Secondary | ICD-10-CM | POA: Diagnosis not present

## 2018-07-13 DIAGNOSIS — R5381 Other malaise: Secondary | ICD-10-CM | POA: Diagnosis not present

## 2018-07-13 DIAGNOSIS — E1165 Type 2 diabetes mellitus with hyperglycemia: Secondary | ICD-10-CM | POA: Diagnosis not present

## 2018-07-13 DIAGNOSIS — I10 Essential (primary) hypertension: Secondary | ICD-10-CM | POA: Diagnosis not present

## 2018-07-13 DIAGNOSIS — E785 Hyperlipidemia, unspecified: Secondary | ICD-10-CM | POA: Diagnosis not present

## 2018-07-13 DIAGNOSIS — M4802 Spinal stenosis, cervical region: Secondary | ICD-10-CM | POA: Diagnosis not present

## 2018-07-13 DIAGNOSIS — Z7984 Long term (current) use of oral hypoglycemic drugs: Secondary | ICD-10-CM | POA: Diagnosis not present

## 2018-07-14 DIAGNOSIS — E1165 Type 2 diabetes mellitus with hyperglycemia: Secondary | ICD-10-CM | POA: Diagnosis not present

## 2018-07-14 DIAGNOSIS — I10 Essential (primary) hypertension: Secondary | ICD-10-CM | POA: Diagnosis not present

## 2018-07-14 DIAGNOSIS — Z7984 Long term (current) use of oral hypoglycemic drugs: Secondary | ICD-10-CM | POA: Diagnosis not present

## 2018-07-14 DIAGNOSIS — E785 Hyperlipidemia, unspecified: Secondary | ICD-10-CM | POA: Diagnosis not present

## 2018-07-15 DIAGNOSIS — E785 Hyperlipidemia, unspecified: Secondary | ICD-10-CM | POA: Diagnosis not present

## 2018-07-15 DIAGNOSIS — Z7984 Long term (current) use of oral hypoglycemic drugs: Secondary | ICD-10-CM | POA: Diagnosis not present

## 2018-07-15 DIAGNOSIS — E1165 Type 2 diabetes mellitus with hyperglycemia: Secondary | ICD-10-CM | POA: Diagnosis not present

## 2018-07-15 DIAGNOSIS — I10 Essential (primary) hypertension: Secondary | ICD-10-CM | POA: Diagnosis not present

## 2018-07-16 DIAGNOSIS — Z7984 Long term (current) use of oral hypoglycemic drugs: Secondary | ICD-10-CM | POA: Diagnosis not present

## 2018-07-16 DIAGNOSIS — E1165 Type 2 diabetes mellitus with hyperglycemia: Secondary | ICD-10-CM | POA: Diagnosis not present

## 2018-07-16 DIAGNOSIS — I10 Essential (primary) hypertension: Secondary | ICD-10-CM | POA: Diagnosis not present

## 2018-07-16 DIAGNOSIS — E785 Hyperlipidemia, unspecified: Secondary | ICD-10-CM | POA: Diagnosis not present

## 2018-08-09 DIAGNOSIS — E1165 Type 2 diabetes mellitus with hyperglycemia: Secondary | ICD-10-CM | POA: Diagnosis not present

## 2018-08-09 DIAGNOSIS — I10 Essential (primary) hypertension: Secondary | ICD-10-CM | POA: Diagnosis not present

## 2018-08-09 DIAGNOSIS — R0602 Shortness of breath: Secondary | ICD-10-CM | POA: Diagnosis not present

## 2018-08-09 DIAGNOSIS — Z7984 Long term (current) use of oral hypoglycemic drugs: Secondary | ICD-10-CM | POA: Diagnosis not present

## 2018-08-09 DIAGNOSIS — R51 Headache: Secondary | ICD-10-CM | POA: Diagnosis not present

## 2018-08-09 DIAGNOSIS — R0989 Other specified symptoms and signs involving the circulatory and respiratory systems: Secondary | ICD-10-CM | POA: Diagnosis not present

## 2018-08-09 DIAGNOSIS — E785 Hyperlipidemia, unspecified: Secondary | ICD-10-CM | POA: Diagnosis not present

## 2018-08-09 DIAGNOSIS — M542 Cervicalgia: Secondary | ICD-10-CM | POA: Diagnosis not present

## 2018-08-10 DIAGNOSIS — E785 Hyperlipidemia, unspecified: Secondary | ICD-10-CM | POA: Diagnosis not present

## 2018-08-10 DIAGNOSIS — Z7984 Long term (current) use of oral hypoglycemic drugs: Secondary | ICD-10-CM | POA: Diagnosis not present

## 2018-08-10 DIAGNOSIS — I10 Essential (primary) hypertension: Secondary | ICD-10-CM | POA: Diagnosis not present

## 2018-08-10 DIAGNOSIS — E1165 Type 2 diabetes mellitus with hyperglycemia: Secondary | ICD-10-CM | POA: Diagnosis not present

## 2018-08-11 DIAGNOSIS — E1165 Type 2 diabetes mellitus with hyperglycemia: Secondary | ICD-10-CM | POA: Diagnosis not present

## 2018-08-11 DIAGNOSIS — E785 Hyperlipidemia, unspecified: Secondary | ICD-10-CM | POA: Diagnosis not present

## 2018-08-11 DIAGNOSIS — I10 Essential (primary) hypertension: Secondary | ICD-10-CM | POA: Diagnosis not present

## 2018-08-11 DIAGNOSIS — Z7984 Long term (current) use of oral hypoglycemic drugs: Secondary | ICD-10-CM | POA: Diagnosis not present

## 2018-08-12 DIAGNOSIS — E785 Hyperlipidemia, unspecified: Secondary | ICD-10-CM | POA: Diagnosis not present

## 2018-08-12 DIAGNOSIS — I10 Essential (primary) hypertension: Secondary | ICD-10-CM | POA: Diagnosis not present

## 2018-08-12 DIAGNOSIS — E1165 Type 2 diabetes mellitus with hyperglycemia: Secondary | ICD-10-CM | POA: Diagnosis not present

## 2018-08-12 DIAGNOSIS — Z7984 Long term (current) use of oral hypoglycemic drugs: Secondary | ICD-10-CM | POA: Diagnosis not present

## 2018-08-13 DIAGNOSIS — I10 Essential (primary) hypertension: Secondary | ICD-10-CM | POA: Diagnosis not present

## 2018-08-13 DIAGNOSIS — Z7984 Long term (current) use of oral hypoglycemic drugs: Secondary | ICD-10-CM | POA: Diagnosis not present

## 2018-08-13 DIAGNOSIS — E785 Hyperlipidemia, unspecified: Secondary | ICD-10-CM | POA: Diagnosis not present

## 2018-08-13 DIAGNOSIS — E1165 Type 2 diabetes mellitus with hyperglycemia: Secondary | ICD-10-CM | POA: Diagnosis not present

## 2018-08-14 DIAGNOSIS — E785 Hyperlipidemia, unspecified: Secondary | ICD-10-CM | POA: Diagnosis not present

## 2018-08-14 DIAGNOSIS — E663 Overweight: Secondary | ICD-10-CM | POA: Diagnosis not present

## 2018-08-14 DIAGNOSIS — Z7984 Long term (current) use of oral hypoglycemic drugs: Secondary | ICD-10-CM | POA: Diagnosis not present

## 2018-08-14 DIAGNOSIS — E1165 Type 2 diabetes mellitus with hyperglycemia: Secondary | ICD-10-CM | POA: Diagnosis not present

## 2018-08-15 DIAGNOSIS — Z7984 Long term (current) use of oral hypoglycemic drugs: Secondary | ICD-10-CM | POA: Diagnosis not present

## 2018-08-15 DIAGNOSIS — R42 Dizziness and giddiness: Secondary | ICD-10-CM | POA: Diagnosis not present

## 2018-08-15 DIAGNOSIS — E785 Hyperlipidemia, unspecified: Secondary | ICD-10-CM | POA: Diagnosis not present

## 2018-08-15 DIAGNOSIS — R0602 Shortness of breath: Secondary | ICD-10-CM | POA: Diagnosis not present

## 2018-08-15 DIAGNOSIS — E663 Overweight: Secondary | ICD-10-CM | POA: Diagnosis not present

## 2018-08-15 DIAGNOSIS — E1165 Type 2 diabetes mellitus with hyperglycemia: Secondary | ICD-10-CM | POA: Diagnosis not present

## 2018-08-15 DIAGNOSIS — R0989 Other specified symptoms and signs involving the circulatory and respiratory systems: Secondary | ICD-10-CM | POA: Diagnosis not present

## 2018-08-15 DIAGNOSIS — R296 Repeated falls: Secondary | ICD-10-CM | POA: Diagnosis not present

## 2018-08-16 DIAGNOSIS — I10 Essential (primary) hypertension: Secondary | ICD-10-CM | POA: Diagnosis not present

## 2018-08-16 DIAGNOSIS — R079 Chest pain, unspecified: Secondary | ICD-10-CM | POA: Diagnosis not present

## 2018-08-16 DIAGNOSIS — R296 Repeated falls: Secondary | ICD-10-CM | POA: Diagnosis not present

## 2018-08-16 DIAGNOSIS — R42 Dizziness and giddiness: Secondary | ICD-10-CM | POA: Diagnosis not present

## 2018-08-16 DIAGNOSIS — E1165 Type 2 diabetes mellitus with hyperglycemia: Secondary | ICD-10-CM | POA: Diagnosis not present

## 2018-08-16 DIAGNOSIS — E785 Hyperlipidemia, unspecified: Secondary | ICD-10-CM | POA: Diagnosis not present

## 2018-08-16 DIAGNOSIS — Z7984 Long term (current) use of oral hypoglycemic drugs: Secondary | ICD-10-CM | POA: Diagnosis not present

## 2018-08-17 DIAGNOSIS — R42 Dizziness and giddiness: Secondary | ICD-10-CM | POA: Diagnosis not present

## 2018-08-17 DIAGNOSIS — J90 Pleural effusion, not elsewhere classified: Secondary | ICD-10-CM | POA: Diagnosis not present

## 2018-08-17 DIAGNOSIS — R05 Cough: Secondary | ICD-10-CM | POA: Diagnosis not present

## 2018-08-17 DIAGNOSIS — E1165 Type 2 diabetes mellitus with hyperglycemia: Secondary | ICD-10-CM | POA: Diagnosis not present

## 2018-08-17 DIAGNOSIS — R296 Repeated falls: Secondary | ICD-10-CM | POA: Diagnosis not present

## 2018-08-17 DIAGNOSIS — Z7984 Long term (current) use of oral hypoglycemic drugs: Secondary | ICD-10-CM | POA: Diagnosis not present

## 2018-08-17 DIAGNOSIS — E785 Hyperlipidemia, unspecified: Secondary | ICD-10-CM | POA: Diagnosis not present

## 2018-08-17 DIAGNOSIS — I10 Essential (primary) hypertension: Secondary | ICD-10-CM | POA: Diagnosis not present

## 2018-08-17 DIAGNOSIS — R079 Chest pain, unspecified: Secondary | ICD-10-CM | POA: Diagnosis not present

## 2018-08-18 DIAGNOSIS — N189 Chronic kidney disease, unspecified: Secondary | ICD-10-CM | POA: Diagnosis not present

## 2018-08-18 DIAGNOSIS — E785 Hyperlipidemia, unspecified: Secondary | ICD-10-CM | POA: Diagnosis not present

## 2018-08-18 DIAGNOSIS — R079 Chest pain, unspecified: Secondary | ICD-10-CM | POA: Diagnosis not present

## 2018-08-18 DIAGNOSIS — I10 Essential (primary) hypertension: Secondary | ICD-10-CM | POA: Diagnosis not present

## 2018-08-18 DIAGNOSIS — J90 Pleural effusion, not elsewhere classified: Secondary | ICD-10-CM | POA: Diagnosis not present

## 2018-08-18 DIAGNOSIS — Z7984 Long term (current) use of oral hypoglycemic drugs: Secondary | ICD-10-CM | POA: Diagnosis not present

## 2018-08-18 DIAGNOSIS — R05 Cough: Secondary | ICD-10-CM | POA: Diagnosis not present

## 2018-08-18 DIAGNOSIS — E1165 Type 2 diabetes mellitus with hyperglycemia: Secondary | ICD-10-CM | POA: Diagnosis not present

## 2018-08-19 DIAGNOSIS — E785 Hyperlipidemia, unspecified: Secondary | ICD-10-CM | POA: Diagnosis not present

## 2018-08-19 DIAGNOSIS — R079 Chest pain, unspecified: Secondary | ICD-10-CM | POA: Diagnosis not present

## 2018-08-19 DIAGNOSIS — R05 Cough: Secondary | ICD-10-CM | POA: Diagnosis not present

## 2018-08-19 DIAGNOSIS — Z7984 Long term (current) use of oral hypoglycemic drugs: Secondary | ICD-10-CM | POA: Diagnosis not present

## 2018-08-19 DIAGNOSIS — J90 Pleural effusion, not elsewhere classified: Secondary | ICD-10-CM | POA: Diagnosis not present

## 2018-08-19 DIAGNOSIS — I10 Essential (primary) hypertension: Secondary | ICD-10-CM | POA: Diagnosis not present

## 2018-08-19 DIAGNOSIS — E1165 Type 2 diabetes mellitus with hyperglycemia: Secondary | ICD-10-CM | POA: Diagnosis not present

## 2018-08-20 DIAGNOSIS — Z7984 Long term (current) use of oral hypoglycemic drugs: Secondary | ICD-10-CM | POA: Diagnosis not present

## 2018-08-20 DIAGNOSIS — J939 Pneumothorax, unspecified: Secondary | ICD-10-CM | POA: Diagnosis not present

## 2018-08-20 DIAGNOSIS — E785 Hyperlipidemia, unspecified: Secondary | ICD-10-CM | POA: Diagnosis not present

## 2018-08-20 DIAGNOSIS — E1165 Type 2 diabetes mellitus with hyperglycemia: Secondary | ICD-10-CM | POA: Diagnosis not present

## 2018-08-20 DIAGNOSIS — I10 Essential (primary) hypertension: Secondary | ICD-10-CM | POA: Diagnosis not present

## 2018-08-21 DIAGNOSIS — Z472 Encounter for removal of internal fixation device: Secondary | ICD-10-CM | POA: Diagnosis not present

## 2018-08-21 DIAGNOSIS — E1165 Type 2 diabetes mellitus with hyperglycemia: Secondary | ICD-10-CM | POA: Diagnosis not present

## 2018-08-21 DIAGNOSIS — R079 Chest pain, unspecified: Secondary | ICD-10-CM | POA: Diagnosis not present

## 2018-08-21 DIAGNOSIS — Z7984 Long term (current) use of oral hypoglycemic drugs: Secondary | ICD-10-CM | POA: Diagnosis not present

## 2018-08-21 DIAGNOSIS — E785 Hyperlipidemia, unspecified: Secondary | ICD-10-CM | POA: Diagnosis not present

## 2018-08-21 DIAGNOSIS — I10 Essential (primary) hypertension: Secondary | ICD-10-CM | POA: Diagnosis not present

## 2018-08-22 DIAGNOSIS — E1165 Type 2 diabetes mellitus with hyperglycemia: Secondary | ICD-10-CM | POA: Diagnosis not present

## 2018-08-22 DIAGNOSIS — R918 Other nonspecific abnormal finding of lung field: Secondary | ICD-10-CM | POA: Diagnosis not present

## 2018-08-22 DIAGNOSIS — Z7984 Long term (current) use of oral hypoglycemic drugs: Secondary | ICD-10-CM | POA: Diagnosis not present

## 2018-08-22 DIAGNOSIS — E785 Hyperlipidemia, unspecified: Secondary | ICD-10-CM | POA: Diagnosis not present

## 2018-08-22 DIAGNOSIS — I10 Essential (primary) hypertension: Secondary | ICD-10-CM | POA: Diagnosis not present

## 2018-08-22 DIAGNOSIS — R0602 Shortness of breath: Secondary | ICD-10-CM | POA: Diagnosis not present

## 2018-08-23 DIAGNOSIS — Z7984 Long term (current) use of oral hypoglycemic drugs: Secondary | ICD-10-CM | POA: Diagnosis not present

## 2018-08-23 DIAGNOSIS — E1165 Type 2 diabetes mellitus with hyperglycemia: Secondary | ICD-10-CM | POA: Diagnosis not present

## 2018-08-23 DIAGNOSIS — E785 Hyperlipidemia, unspecified: Secondary | ICD-10-CM | POA: Diagnosis not present

## 2018-08-23 DIAGNOSIS — I10 Essential (primary) hypertension: Secondary | ICD-10-CM | POA: Diagnosis not present

## 2018-08-23 DIAGNOSIS — Z01818 Encounter for other preprocedural examination: Secondary | ICD-10-CM | POA: Diagnosis not present

## 2018-08-24 DIAGNOSIS — E1165 Type 2 diabetes mellitus with hyperglycemia: Secondary | ICD-10-CM | POA: Diagnosis not present

## 2018-08-24 DIAGNOSIS — Z01811 Encounter for preprocedural respiratory examination: Secondary | ICD-10-CM | POA: Diagnosis not present

## 2018-08-24 DIAGNOSIS — I10 Essential (primary) hypertension: Secondary | ICD-10-CM | POA: Diagnosis not present

## 2018-08-24 DIAGNOSIS — Z7984 Long term (current) use of oral hypoglycemic drugs: Secondary | ICD-10-CM | POA: Diagnosis not present

## 2018-08-24 DIAGNOSIS — E785 Hyperlipidemia, unspecified: Secondary | ICD-10-CM | POA: Diagnosis not present

## 2018-08-25 DIAGNOSIS — I10 Essential (primary) hypertension: Secondary | ICD-10-CM | POA: Diagnosis not present

## 2018-08-25 DIAGNOSIS — Z7984 Long term (current) use of oral hypoglycemic drugs: Secondary | ICD-10-CM | POA: Diagnosis not present

## 2018-08-25 DIAGNOSIS — Z9889 Other specified postprocedural states: Secondary | ICD-10-CM | POA: Diagnosis not present

## 2018-08-25 DIAGNOSIS — E785 Hyperlipidemia, unspecified: Secondary | ICD-10-CM | POA: Diagnosis not present

## 2018-08-25 DIAGNOSIS — E1165 Type 2 diabetes mellitus with hyperglycemia: Secondary | ICD-10-CM | POA: Diagnosis not present
